# Patient Record
Sex: Female | Born: 1960 | Race: White | Hispanic: No | Marital: Married | Smoking: Never smoker
Health system: Southern US, Community
[De-identification: ages and names within clinical notes are randomized; demographics above are authoritative.]

## PROBLEM LIST (undated history)

## (undated) DIAGNOSIS — I10 Essential (primary) hypertension: Secondary | ICD-10-CM

## (undated) DIAGNOSIS — T8859XA Other complications of anesthesia, initial encounter: Secondary | ICD-10-CM

## (undated) DIAGNOSIS — G43909 Migraine, unspecified, not intractable, without status migrainosus: Secondary | ICD-10-CM

## (undated) DIAGNOSIS — Z87442 Personal history of urinary calculi: Secondary | ICD-10-CM

## (undated) DIAGNOSIS — F419 Anxiety disorder, unspecified: Secondary | ICD-10-CM

## (undated) DIAGNOSIS — A498 Other bacterial infections of unspecified site: Secondary | ICD-10-CM

## (undated) DIAGNOSIS — E785 Hyperlipidemia, unspecified: Secondary | ICD-10-CM

## (undated) DIAGNOSIS — N200 Calculus of kidney: Secondary | ICD-10-CM

## (undated) HISTORY — DX: Essential (primary) hypertension: I10

## (undated) HISTORY — DX: Migraine, unspecified, not intractable, without status migrainosus: G43.909

## (undated) HISTORY — DX: Calculus of kidney: N20.0

## (undated) HISTORY — DX: Anxiety disorder, unspecified: F41.9

## (undated) HISTORY — DX: Hyperlipidemia, unspecified: E78.5

---

## 1998-10-19 ENCOUNTER — Other Ambulatory Visit: Admission: RE | Admit: 1998-10-19 | Discharge: 1998-10-19 | Payer: Self-pay | Admitting: Obstetrics & Gynecology

## 2000-05-21 ENCOUNTER — Other Ambulatory Visit: Admission: RE | Admit: 2000-05-21 | Discharge: 2000-05-21 | Payer: Self-pay | Admitting: Obstetrics & Gynecology

## 2001-05-30 ENCOUNTER — Other Ambulatory Visit: Admission: RE | Admit: 2001-05-30 | Discharge: 2001-05-30 | Payer: Self-pay | Admitting: Obstetrics & Gynecology

## 2002-06-04 ENCOUNTER — Other Ambulatory Visit: Admission: RE | Admit: 2002-06-04 | Discharge: 2002-06-04 | Payer: Self-pay | Admitting: Obstetrics & Gynecology

## 2003-06-10 ENCOUNTER — Other Ambulatory Visit: Admission: RE | Admit: 2003-06-10 | Discharge: 2003-06-10 | Payer: Self-pay | Admitting: Obstetrics & Gynecology

## 2004-11-23 ENCOUNTER — Other Ambulatory Visit: Admission: RE | Admit: 2004-11-23 | Discharge: 2004-11-23 | Payer: Self-pay | Admitting: Specialist

## 2015-03-26 ENCOUNTER — Encounter: Payer: Self-pay | Admitting: Internal Medicine

## 2015-03-26 ENCOUNTER — Ambulatory Visit (INDEPENDENT_AMBULATORY_CARE_PROVIDER_SITE_OTHER): Payer: BLUE CROSS/BLUE SHIELD | Admitting: Internal Medicine

## 2015-03-26 ENCOUNTER — Other Ambulatory Visit (INDEPENDENT_AMBULATORY_CARE_PROVIDER_SITE_OTHER): Payer: BLUE CROSS/BLUE SHIELD

## 2015-03-26 VITALS — BP 132/88 | HR 106 | Temp 98.1°F | Resp 16 | Ht 60.5 in | Wt 153.0 lb

## 2015-03-26 DIAGNOSIS — E785 Hyperlipidemia, unspecified: Secondary | ICD-10-CM

## 2015-03-26 DIAGNOSIS — G44229 Chronic tension-type headache, not intractable: Secondary | ICD-10-CM

## 2015-03-26 DIAGNOSIS — G43009 Migraine without aura, not intractable, without status migrainosus: Secondary | ICD-10-CM

## 2015-03-26 DIAGNOSIS — G43909 Migraine, unspecified, not intractable, without status migrainosus: Secondary | ICD-10-CM | POA: Insufficient documentation

## 2015-03-26 DIAGNOSIS — K219 Gastro-esophageal reflux disease without esophagitis: Secondary | ICD-10-CM

## 2015-03-26 DIAGNOSIS — R519 Headache, unspecified: Secondary | ICD-10-CM

## 2015-03-26 DIAGNOSIS — I10 Essential (primary) hypertension: Secondary | ICD-10-CM | POA: Diagnosis not present

## 2015-03-26 DIAGNOSIS — R51 Headache: Secondary | ICD-10-CM

## 2015-03-26 DIAGNOSIS — G8929 Other chronic pain: Secondary | ICD-10-CM

## 2015-03-26 DIAGNOSIS — E782 Mixed hyperlipidemia: Secondary | ICD-10-CM | POA: Insufficient documentation

## 2015-03-26 LAB — CBC WITH DIFFERENTIAL/PLATELET
BASOS ABS: 0 10*3/uL (ref 0.0–0.1)
Basophils Relative: 0.3 % (ref 0.0–3.0)
EOS ABS: 0.2 10*3/uL (ref 0.0–0.7)
EOS PCT: 2.3 % (ref 0.0–5.0)
HCT: 41.3 % (ref 36.0–46.0)
HEMOGLOBIN: 13.6 g/dL (ref 12.0–15.0)
Lymphocytes Relative: 28.8 % (ref 12.0–46.0)
Lymphs Abs: 2.5 10*3/uL (ref 0.7–4.0)
MCHC: 33 g/dL (ref 30.0–36.0)
MCV: 85.7 fl (ref 78.0–100.0)
MONO ABS: 0.6 10*3/uL (ref 0.1–1.0)
Monocytes Relative: 7 % (ref 3.0–12.0)
Neutro Abs: 5.4 10*3/uL (ref 1.4–7.7)
Neutrophils Relative %: 61.6 % (ref 43.0–77.0)
Platelets: 285 10*3/uL (ref 150.0–400.0)
RBC: 4.82 Mil/uL (ref 3.87–5.11)
RDW: 13.7 % (ref 11.5–15.5)
WBC: 8.8 10*3/uL (ref 4.0–10.5)

## 2015-03-26 LAB — COMPREHENSIVE METABOLIC PANEL
ALBUMIN: 4.3 g/dL (ref 3.5–5.2)
ALK PHOS: 98 U/L (ref 39–117)
ALT: 14 U/L (ref 0–35)
AST: 15 U/L (ref 0–37)
BILIRUBIN TOTAL: 0.4 mg/dL (ref 0.2–1.2)
BUN: 21 mg/dL (ref 6–23)
CO2: 30 mEq/L (ref 19–32)
CREATININE: 0.95 mg/dL (ref 0.40–1.20)
Calcium: 9.8 mg/dL (ref 8.4–10.5)
Chloride: 103 mEq/L (ref 96–112)
GFR: 65.15 mL/min (ref >60.00)
GLUCOSE: 94 mg/dL (ref 70–99)
Potassium: 3.4 mEq/L — ABNORMAL LOW (ref 3.5–5.1)
SODIUM: 142 meq/L (ref 135–145)
TOTAL PROTEIN: 8.1 g/dL (ref 6.0–8.3)

## 2015-03-26 LAB — LIPID PANEL
Cholesterol: 182 mg/dL (ref 0–200)
HDL: 40.4 mg/dL (ref >39.00)
LDL Cholesterol: 114 mg/dL — ABNORMAL HIGH (ref 0–99)
NonHDL: 141.46
Total CHOL/HDL Ratio: 5
Triglycerides: 138 mg/dL (ref 0.0–149.0)
VLDL: 27.6 mg/dL (ref 0.0–40.0)

## 2015-03-26 LAB — HEMOGLOBIN A1C: HEMOGLOBIN A1C: 6.2 % (ref 4.6–6.5)

## 2015-03-26 MED ORDER — LOSARTAN POTASSIUM-HCTZ 100-12.5 MG PO TABS: 1.0000 | ORAL_TABLET | Freq: Every day | ORAL | Status: DC

## 2015-03-26 MED ORDER — RIZATRIPTAN BENZOATE 10 MG PO TABS: 10.0000 mg | ORAL_TABLET | ORAL | Status: DC | PRN

## 2015-03-26 MED ORDER — AMITRIPTYLINE HCL 10 MG PO TABS: 10.0000 mg | ORAL_TABLET | Freq: Every day | ORAL | Status: DC

## 2015-03-26 MED ORDER — OMEPRAZOLE 20 MG PO CPDR: 20.0000 mg | DELAYED_RELEASE_CAPSULE | Freq: Every day | ORAL | Status: DC

## 2015-03-26 MED ORDER — ATORVASTATIN CALCIUM 10 MG PO TABS: 10.0000 mg | ORAL_TABLET | Freq: Every day | ORAL | Status: DC

## 2015-03-26 NOTE — Progress Notes (Signed)
Pre visit review using our clinic review tool, if applicable. No additional management support is needed unless otherwise documented below in the visit note. 

## 2015-03-26 NOTE — Progress Notes (Signed)
Subjective:    Patient ID: Sheryl Lowe, female    DOB: 10/06/1960, 55 y.o.   MRN: [id number]  HPI She is here to establish with a new pcp.  She has no specific complaints.   Migraines, headaches:  Her migraines started in her 20's, they got better, and then worse. She has seen neuro and [facility].  She has been on multiple meds.  Topamax helped, but gave her some memory loss.  She also tried accupuncture, chiropractor, beta blocker, verapamil, and gabapentin.  She does not find the imitex very helpful and always has to repeat the dose.  She has been on the injections in the past as well.  She has tried Scientist, clinical (histocompatibility and immunogenetics) and would like to try that - it worked well.  Her severe migraines are about 2-3 times a month.  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  She has a daily headache that is across the frontal aspect of her head and temples and in the back of her head.  She takes excedrin daily.  She has a stressful life and works on the computer all day.  She does not exercise.    Hypertension: She is taking her medication daily. She is compliant with a low sodium diet.  She denies chest pain, palpitations, edema, shortness of breath and regular headaches. She is not exercising regularly.  She does not monitor her blood pressure at home.    Hyperlipidemia: She is taking her medication daily. She is somewhat compliant with a low fat/cholesterol diet. She is not exercising regularly. She denies myalgias.   No regular exercise.  She hates to exercise.    Medications and allergies reviewed with patient and updated if appropriate.  Patient Active Problem List   Diagnosis Date Noted  . Hyperlipidemia 03/26/2015  . Essential hypertension, benign 03/26/2015  . Migraine 03/26/2015    No current outpatient prescriptions on file prior to visit.   No current facility-administered medications on file prior to visit.    Past Medical History  Diagnosis Date  . Kidney stones   . Hypertension   . Hyperlipidemia    . Anxiety   . Migraine     History reviewed. No pertinent past surgical history.  Social History   Social History  . Marital Status: Married    Spouse Name: N/A  . Number of Children: N/A  . Years of Education: N/A   Social History Main Topics  . Smoking status: Never Smoker   . Smokeless tobacco: Never Used  . Alcohol Use: Yes  . Drug Use: No  . Sexual Activity: Not Asked   Other Topics Concern  . None   Social History Narrative  . None    Family History  Problem Relation Age of Onset  . Stroke Mother   . Heart disease Mother   . Hypertension Father   . Cancer Father     Review of Systems  Constitutional: Negative for fever and chills.  HENT: Positive for postnasal drip.   Respiratory: Positive for cough. Negative for shortness of breath and wheezing.   Cardiovascular: Negative for chest pain, palpitations and leg swelling.  Gastrointestinal: Negative for nausea, abdominal pain, diarrhea, constipation and blood in stool.       GERD  Genitourinary: Negative for dysuria and hematuria.  Musculoskeletal: Negative for back pain and arthralgias.  Skin: Negative for rash.       Dry skin  Neurological: Positive for headaches. Negative for dizziness and light-headedness.  Psychiatric/Behavioral: Negative for  dysphoric mood. The patient is nervous/anxious.        Objective:   Filed Vitals:   03/26/15 1439  BP: 132/88  Pulse: 106  Temp: 98.1 F (36.7 C)  Resp: 16   Filed Weights   03/26/15 1439  Weight: 153 lb (69.4 kg)   Body mass index is 29.38 kg/(m^2).   Physical Exam Constitutional: She appears well-developed and well-nourished. No distress.  HENT:  Head: Normocephalic and atraumatic.  Right Ear: External ear normal. Normal ear canal and TM Left Ear: External ear normal.  Normal ear canal and TM Mouth/Throat: Oropharynx is clear and moist.  Normal bilateral ear canals and tympanic membranes  Eyes: Conjunctivae and EOM are normal.  Neck: Neck  supple. No tracheal deviation present. No thyromegaly present.  No carotid bruit  Cardiovascular: Normal rate, regular rhythm and normal heart sounds.   No murmur heard.  No edema. Pulmonary/Chest: Effort normal and breath sounds normal. No respiratory distress. She has no wheezes. She has no rales.  Abdominal: Soft. She exhibits no distension. There is no tenderness.  Lymphadenopathy: She has no cervical adenopathy.  Skin: Skin is warm and dry. She is not diaphoretic.  Psychiatric: She has a normal mood and affect. Her behavior is normal.       Assessment & Plan:   See Problem List for Assessment and Plan of chronic medical problems.   Follow up 6 months

## 2015-03-26 NOTE — Patient Instructions (Addendum)
  We have reviewed your prior records including labs and tests today.  Test(s) ordered today. Your results will be released to MyChart (or called to you) after review, usually within 72hours after test completion. If any changes need to be made, you will be notified at that same time.   Medications reviewed and updated.  We will change the imitrex to maxalt.  We will also try a medication to help prevent the headaches.  Your prescription(s) have been submitted to your pharmacy. Please take as directed and contact our office if you believe you are having problem(s) with the medication(s).  Try a magnesium supplement.  Start exercising.  Increase your water intake.  Reduce your stress.  Consider massage therapy.  Please schedule followup in 6 months

## 2015-03-27 DIAGNOSIS — K219 Gastro-esophageal reflux disease without esophagitis: Secondary | ICD-10-CM | POA: Insufficient documentation

## 2015-03-27 DIAGNOSIS — G8929 Other chronic pain: Secondary | ICD-10-CM | POA: Insufficient documentation

## 2015-03-27 DIAGNOSIS — R51 Headache: Secondary | ICD-10-CM

## 2015-03-27 NOTE — Assessment & Plan Note (Signed)
BP controlled here today Continue current meds Low sodium diet Start exercising Weight loss

## 2015-03-27 NOTE — Assessment & Plan Note (Signed)
Check lipid panel. Continue lipitor. 

## 2015-03-27 NOTE — Assessment & Plan Note (Signed)
Daily headaches, 2-3 migraines that are severe a month maxalt prn

## 2015-03-27 NOTE — Assessment & Plan Note (Signed)
Daily headaches Advised regular exercise, decreasing stress Need to cut down on excedrin  - may be having some rebound headaches Trial of magnesium supplementation Trial of elavil - start with 5 mg and increase if tolerated

## 2015-03-27 NOTE — Assessment & Plan Note (Signed)
Controlled with daily otc omeprazole

## 2015-03-29 LAB — TSH: TSH: 0.65 u[IU]/mL (ref 0.35–4.50)

## 2015-03-31 ENCOUNTER — Encounter: Payer: Self-pay | Admitting: Internal Medicine

## 2015-03-31 DIAGNOSIS — R7303 Prediabetes: Secondary | ICD-10-CM | POA: Insufficient documentation

## 2015-05-10 ENCOUNTER — Encounter: Payer: Self-pay | Admitting: Internal Medicine

## 2015-05-16 MED ORDER — TOPIRAMATE 25 MG PO TABS: ORAL_TABLET | ORAL | Status: DC

## 2015-05-16 NOTE — Addendum Note (Signed)
Addended by: Pincus SanesBURNS, STACY J on: 05/16/2015 09:57 AM   Modules accepted: Orders, Medications

## 2015-06-15 ENCOUNTER — Ambulatory Visit: Payer: BLUE CROSS/BLUE SHIELD | Admitting: Internal Medicine

## 2015-10-18 ENCOUNTER — Ambulatory Visit (INDEPENDENT_AMBULATORY_CARE_PROVIDER_SITE_OTHER): Payer: BLUE CROSS/BLUE SHIELD | Admitting: Internal Medicine

## 2015-10-18 ENCOUNTER — Encounter: Payer: Self-pay | Admitting: Internal Medicine

## 2015-10-18 VITALS — BP 136/88 | HR 94 | Temp 98.0°F | Resp 16 | Ht 61.0 in | Wt 150.0 lb

## 2015-10-18 DIAGNOSIS — I10 Essential (primary) hypertension: Secondary | ICD-10-CM

## 2015-10-18 DIAGNOSIS — G43009 Migraine without aura, not intractable, without status migrainosus: Secondary | ICD-10-CM

## 2015-10-18 DIAGNOSIS — G44229 Chronic tension-type headache, not intractable: Secondary | ICD-10-CM | POA: Diagnosis not present

## 2015-10-18 MED ORDER — GABAPENTIN 100 MG PO CAPS: ORAL_CAPSULE | ORAL | 3 refills | Status: DC

## 2015-10-18 MED ORDER — CANDESARTAN CILEXETIL-HCTZ 32-12.5 MG PO TABS: 1.0000 | ORAL_TABLET | Freq: Every day | ORAL | 5 refills | Status: DC

## 2015-10-18 NOTE — Assessment & Plan Note (Addendum)
Has combination of migraines and daily headaches Trial of gabapentin. She does have some element of cervical radiculopathy and chronic neck pain, which may be contributing to her headaches and the gabapentin may help Discussed possible side effects of gabapentin Stressed regular exercise Consider physical therapy

## 2015-10-18 NOTE — Progress Notes (Signed)
Subjective:    Patient ID: Sheryl Lowe, female    DOB: 1960/06/11, 55 y.o.   MRN: [id number]  HPI  She is here for an acute visit because she is having side effects from her medication.  Chronic headaches/migraines:  She has been taking the Topamax nightly as prescribed. She did not tolerate it in the past, but agreed to retry because of the severity of her headaches.  Since starting the medication she has been experiencing memory loss and tingling in the hands and feet. These are the same side effect she had previously with the medication. She does want to discontinue the medication. The headaches have improved. She is no longer having daily headaches. She now averages 2 headaches a month. Maxalt works when she takes it. She has tried blood pressure medications in the past and they have not been effective. She also tried amitriptyline and even 10 mg made her too drowsy. She has never tried gabapentin as far she knows.  Hypertension: She is taking her medication daily as prescribed. She does not monitor her blood pressure regularly at home. She is not exercising regularly.  Medications and allergies reviewed with patient and updated if appropriate.  Patient Active Problem List   Diagnosis Date Noted  . Prediabetes 03/31/2015  . Chronic headaches 03/27/2015  . GERD (gastroesophageal reflux disease) 03/27/2015  . Hyperlipidemia 03/26/2015  . Essential hypertension, benign 03/26/2015  . Migraine 03/26/2015    Current Outpatient Prescriptions on File Prior to Visit  Medication Sig Dispense Refill  . atorvastatin (LIPITOR) 10 MG tablet Take 1 tablet (10 mg total) by mouth daily. 90 tablet 3  . losartan-hydrochlorothiazide (HYZAAR) 100-12.5 MG tablet Take 1 tablet by mouth daily. 90 tablet 3  . omeprazole (PRILOSEC) 20 MG capsule Take 1 capsule (20 mg total) by mouth daily. 30 capsule 3  . rizatriptan (MAXALT) 10 MG tablet Take 1 tablet (10 mg total) by mouth as needed for migraine. May  repeat in 2 hours if needed 10 tablet 8  . topiramate (TOPAMAX) 25 MG tablet Take 25 mg at bedtime for one week then increase to 50 mg at bedtime 60 tablet 2   No current facility-administered medications on file prior to visit.     Past Medical History:  Diagnosis Date  . Anxiety   . Hyperlipidemia   . Hypertension   . Kidney stones   . Migraine     No past surgical history on file.  Social History   Social History  . Marital status: Married    Spouse name: N/A  . Number of children: N/A  . Years of education: N/A   Social History Main Topics  . Smoking status: Never Smoker  . Smokeless tobacco: Never Used  . Alcohol use Yes  . Drug use: No  . Sexual activity: Not on file   Other Topics Concern  . Not on file   Social History Narrative  . No narrative on file    Family History  Problem Relation Age of Onset  . Stroke Mother   . Heart disease Mother   . Hypertension Father   . Cancer Father     Review of Systems  Respiratory: Negative for cough, shortness of breath and wheezing.   Cardiovascular: Negative for chest pain, palpitations and leg swelling.  Neurological: Positive for numbness and headaches. Negative for dizziness and light-headedness.  Psychiatric/Behavioral:       Short-term memory loss-related to Topamax       Objective:  Vitals:   10/18/15 1131  BP: 136/88  Pulse: 94  Resp: 16  Temp: 98 F (36.7 C)   Filed Weights   10/18/15 1131  Weight: 150 lb (68 kg)   Body mass index is 28.34 kg/m.   Physical Exam Constitutional: Appears well-developed and well-nourished. No distress.  HENT:  Head: Normocephalic and atraumatic.  Neck: Neck supple. No tracheal deviation present. No thyromegaly present.  Cardiovascular: Normal rate, regular rhythm and normal heart sounds.   No murmur heard. No carotid bruit  Pulmonary/Chest: Effort normal and breath sounds normal. No respiratory distress. No has no wheezes. No rales.  Musculoskeletal:  No edema.  Lymphadenopathy: No cervical adenopathy.  Skin: Skin is warm and dry. Not diaphoretic.  Psychiatric: Normal mood and affect. Behavior is normal.        Assessment & Plan:   See Problem List for Assessment and Plan of chronic medical problems.

## 2015-10-18 NOTE — Progress Notes (Signed)
Patient received education resource, including the self-management goal and tool. Patient verbalized understanding. 

## 2015-10-18 NOTE — Assessment & Plan Note (Signed)
Blood pressure controlled here today Since candesartan has been shown to help with headaches we will change her losartan to candesartan and continue the low-dose hydrochlorothiazide She will check her blood pressure at home to make sure it is controlled on any medication and call if any concerns, uncontrolled blood pressure or side effects

## 2015-10-18 NOTE — Patient Instructions (Signed)
   Medications reviewed and updated.  Changes include stopping the topamax.  Start the gabapentin at night.  Stop the losartan -hctz and start candasartan-hctz.    Your prescription(s) have been submitted to your pharmacy. Please take as directed and contact our office if you believe you are having problem(s) with the medication(s).

## 2015-10-18 NOTE — Assessment & Plan Note (Addendum)
Chronic headaches Maxalt is effective and she will continue that as needed Headaches improved with Topamax, but she is having side effects She did not tolerate Topamax, amitriptyline, calcium channel blockers or beta blockers in the past Trial of gabapentin at night-start at 200 mg and increase as needed

## 2016-02-23 ENCOUNTER — Other Ambulatory Visit: Payer: Self-pay | Admitting: Emergency Medicine

## 2016-02-23 MED ORDER — RIZATRIPTAN BENZOATE 10 MG PO TABS: 10.0000 mg | ORAL_TABLET | ORAL | 8 refills | Status: DC | PRN

## 2016-02-27 ENCOUNTER — Telehealth: Payer: BLUE CROSS/BLUE SHIELD | Admitting: Family

## 2016-02-27 DIAGNOSIS — J329 Chronic sinusitis, unspecified: Secondary | ICD-10-CM

## 2016-02-27 DIAGNOSIS — B9789 Other viral agents as the cause of diseases classified elsewhere: Secondary | ICD-10-CM

## 2016-02-27 MED ORDER — FLUTICASONE PROPIONATE 50 MCG/ACT NA SUSP: 2.0000 | Freq: Every day | NASAL | 6 refills | Status: DC

## 2016-02-27 NOTE — Progress Notes (Signed)

## 2016-02-29 ENCOUNTER — Telehealth: Payer: BLUE CROSS/BLUE SHIELD | Admitting: Family

## 2016-02-29 ENCOUNTER — Telehealth: Payer: Self-pay | Admitting: Internal Medicine

## 2016-02-29 ENCOUNTER — Other Ambulatory Visit: Payer: Self-pay

## 2016-02-29 DIAGNOSIS — J019 Acute sinusitis, unspecified: Secondary | ICD-10-CM

## 2016-02-29 DIAGNOSIS — B9689 Other specified bacterial agents as the cause of diseases classified elsewhere: Secondary | ICD-10-CM

## 2016-02-29 MED ORDER — AMOXICILLIN-POT CLAVULANATE 875-125 MG PO TABS: 1.0000 | ORAL_TABLET | Freq: Two times a day (BID) | ORAL | 0 refills | Status: DC

## 2016-02-29 NOTE — Telephone Encounter (Signed)
I [facility] talked with [facility] at [facility]---augmentin has been filled and is in holding basket for patient to pick up

## 2016-02-29 NOTE — Telephone Encounter (Signed)
Spoke with pt, she said augmentin sent to the wrong pharmacy. We resend rx to [facility] but pt said she is on her way to [facility] now-----[facility] [place] (they have the rx there ready for pick up).

## 2016-02-29 NOTE — Progress Notes (Signed)

## 2016-03-28 ENCOUNTER — Encounter: Payer: BLUE CROSS/BLUE SHIELD | Admitting: Internal Medicine

## 2016-04-06 ENCOUNTER — Other Ambulatory Visit: Payer: Self-pay | Admitting: Internal Medicine

## 2016-05-03 ENCOUNTER — Encounter: Payer: Self-pay | Admitting: Internal Medicine

## 2016-05-03 ENCOUNTER — Ambulatory Visit (INDEPENDENT_AMBULATORY_CARE_PROVIDER_SITE_OTHER): Payer: BLUE CROSS/BLUE SHIELD | Admitting: Internal Medicine

## 2016-05-03 ENCOUNTER — Other Ambulatory Visit (INDEPENDENT_AMBULATORY_CARE_PROVIDER_SITE_OTHER): Payer: BLUE CROSS/BLUE SHIELD

## 2016-05-03 VITALS — BP 118/82 | HR 88 | Temp 97.9°F | Resp 16 | Ht 61.0 in | Wt 151.0 lb

## 2016-05-03 DIAGNOSIS — E7800 Pure hypercholesterolemia, unspecified: Secondary | ICD-10-CM

## 2016-05-03 DIAGNOSIS — R7303 Prediabetes: Secondary | ICD-10-CM

## 2016-05-03 DIAGNOSIS — G44229 Chronic tension-type headache, not intractable: Secondary | ICD-10-CM | POA: Diagnosis not present

## 2016-05-03 DIAGNOSIS — Z114 Encounter for screening for human immunodeficiency virus [HIV]: Secondary | ICD-10-CM | POA: Diagnosis not present

## 2016-05-03 DIAGNOSIS — E663 Overweight: Secondary | ICD-10-CM | POA: Diagnosis not present

## 2016-05-03 DIAGNOSIS — Z1211 Encounter for screening for malignant neoplasm of colon: Secondary | ICD-10-CM | POA: Diagnosis not present

## 2016-05-03 DIAGNOSIS — R3989 Other symptoms and signs involving the genitourinary system: Secondary | ICD-10-CM | POA: Diagnosis not present

## 2016-05-03 DIAGNOSIS — Z0000 Encounter for general adult medical examination without abnormal findings: Secondary | ICD-10-CM

## 2016-05-03 DIAGNOSIS — Z1159 Encounter for screening for other viral diseases: Secondary | ICD-10-CM

## 2016-05-03 DIAGNOSIS — I10 Essential (primary) hypertension: Secondary | ICD-10-CM | POA: Diagnosis not present

## 2016-05-03 DIAGNOSIS — K219 Gastro-esophageal reflux disease without esophagitis: Secondary | ICD-10-CM | POA: Diagnosis not present

## 2016-05-03 DIAGNOSIS — G43009 Migraine without aura, not intractable, without status migrainosus: Secondary | ICD-10-CM

## 2016-05-03 LAB — LIPID PANEL
CHOLESTEROL: 213 mg/dL — AB (ref 0–200)
HDL: 44 mg/dL (ref >39.00)
LDL CALC: 136 mg/dL — AB (ref 0–99)
NonHDL: 169.14
Total CHOL/HDL Ratio: 5
Triglycerides: 168 mg/dL — ABNORMAL HIGH (ref 0.0–149.0)
VLDL: 33.6 mg/dL (ref 0.0–40.0)

## 2016-05-03 LAB — COMPREHENSIVE METABOLIC PANEL
ALBUMIN: 4.7 g/dL (ref 3.5–5.2)
ALT: 17 U/L (ref 0–35)
AST: 17 U/L (ref 0–37)
Alkaline Phosphatase: 95 U/L (ref 39–117)
BUN: 17 mg/dL (ref 6–23)
CALCIUM: 10 mg/dL (ref 8.4–10.5)
CHLORIDE: 100 meq/L (ref 96–112)
CO2: 29 mEq/L (ref 19–32)
CREATININE: 0.85 mg/dL (ref 0.40–1.20)
GFR: 73.77 mL/min (ref >60.00)
Glucose, Bld: 91 mg/dL (ref 70–99)
POTASSIUM: 3 meq/L — AB (ref 3.5–5.1)
SODIUM: 139 meq/L (ref 135–145)
Total Bilirubin: 0.5 mg/dL (ref 0.2–1.2)
Total Protein: 8.5 g/dL — ABNORMAL HIGH (ref 6.0–8.3)

## 2016-05-03 LAB — CBC WITH DIFFERENTIAL/PLATELET
BASOS PCT: 0.5 % (ref 0.0–3.0)
Basophils Absolute: 0 10*3/uL (ref 0.0–0.1)
EOS ABS: 0.2 10*3/uL (ref 0.0–0.7)
EOS PCT: 1.7 % (ref 0.0–5.0)
HEMATOCRIT: 40.5 % (ref 36.0–46.0)
HEMOGLOBIN: 13.7 g/dL (ref 12.0–15.0)
Lymphocytes Relative: 30 % (ref 12.0–46.0)
Lymphs Abs: 2.6 10*3/uL (ref 0.7–4.0)
MCHC: 33.9 g/dL (ref 30.0–36.0)
MCV: 87.1 fl (ref 78.0–100.0)
MONO ABS: 0.6 10*3/uL (ref 0.1–1.0)
Monocytes Relative: 6.6 % (ref 3.0–12.0)
NEUTROS ABS: 5.4 10*3/uL (ref 1.4–7.7)
Neutrophils Relative %: 61.2 % (ref 43.0–77.0)
PLATELETS: 298 10*3/uL (ref 150.0–400.0)
RBC: 4.65 Mil/uL (ref 3.87–5.11)
RDW: 12.6 % (ref 11.5–15.5)
WBC: 8.8 10*3/uL (ref 4.0–10.5)

## 2016-05-03 LAB — URINALYSIS, ROUTINE W REFLEX MICROSCOPIC
BILIRUBIN URINE: NEGATIVE
HGB URINE DIPSTICK: NEGATIVE
LEUKOCYTES UA: NEGATIVE
Nitrite: NEGATIVE
Specific Gravity, Urine: 1.015 (ref 1.000–1.030)
Total Protein, Urine: NEGATIVE
URINE GLUCOSE: NEGATIVE
UROBILINOGEN UA: 0.2 (ref 0.0–1.0)
pH: 7 (ref 5.0–8.0)

## 2016-05-03 LAB — TSH: TSH: 0.63 u[IU]/mL (ref 0.35–4.50)

## 2016-05-03 LAB — HEMOGLOBIN A1C: HEMOGLOBIN A1C: 6.4 % (ref 4.6–6.5)

## 2016-05-03 MED ORDER — PHENTERMINE HCL 30 MG PO CAPS: 30.0000 mg | ORAL_CAPSULE | ORAL | 0 refills | Status: DC

## 2016-05-03 NOTE — Assessment & Plan Note (Signed)
No severe migraine recently maxalt as needed

## 2016-05-03 NOTE — Progress Notes (Signed)
Subjective:    Patient ID: Sheryl Lowe, female    DOB: 1960/09/03, 56 y.o.   MRN: [id number]  HPI She is here for a physical exam.   Prediabetes:  She is fairly compliant with a low sugar/carbohydrate diet.  She knows she needs to eat better.  She is not exercising regularly.  Migraine headaches, chronic migraines: Her headaches/migraines are better.  She has not had a migraines that are severe recently.  She still has the chronic headaches.    Hypertension,hyperlipidemia: She is taking her medication daily. Her diet is ok and she is working on making improvements. She is not exercising regularly.  She does not monitor her blood pressure at home.    Overweight:  She is interested in something that will help her lose weight - just a temporary prescription.  She knows she needs to eat better and needs to exercise more.    ? UTI, small stone:  She has twinges in her back intermittently.  Her urine color changes from yellow to clear.  She has had UTI's in the past and small kidney stones and she thinks she may have one of those but is not sure which.    Medications and allergies reviewed with patient and updated if appropriate.  Patient Active Problem List   Diagnosis Date Noted  . Abnormal urine color 05/03/2016  . Prediabetes 03/31/2015  . Chronic headaches 03/27/2015  . GERD (gastroesophageal reflux disease) 03/27/2015  . Hyperlipidemia 03/26/2015  . Essential hypertension, benign 03/26/2015  . Migraine 03/26/2015    Current Outpatient Prescriptions on File Prior to Visit  Medication Sig Dispense Refill  . atorvastatin (LIPITOR) 10 MG tablet TAKE ONE TABLET BY MOUTH ONCE DAILY 90 tablet 0  . candesartan-hydrochlorothiazide (ATACAND HCT) 32-12.5 MG tablet Take 1 tablet by mouth daily. 30 tablet 5  . gabapentin (NEURONTIN) 100 MG capsule Take 2 caps at night for two nights, increase to 3 caps at night if well tolerated 90 capsule 3  . omeprazole (PRILOSEC) 20 MG capsule Take 1  capsule (20 mg total) by mouth daily. 30 capsule 3  . rizatriptan (MAXALT) 10 MG tablet Take 1 tablet (10 mg total) by mouth as needed for migraine. May repeat in 2 hours if needed 10 tablet 8   No current facility-administered medications on file prior to visit.     Past Medical History:  Diagnosis Date  . Anxiety   . Hyperlipidemia   . Hypertension   . Kidney stones   . Migraine     No past surgical history on file.  Social History   Social History  . Marital status: Married    Spouse name: N/A  . Number of children: N/A  . Years of education: N/A   Social History Main Topics  . Smoking status: Never Smoker  . Smokeless tobacco: Never Used  . Alcohol use Yes  . Drug use: No  . Sexual activity: Not Asked   Other Topics Concern  . None   Social History Narrative  . None    Family History  Problem Relation Age of Onset  . Stroke Mother   . Heart disease Mother   . Hypertension Father   . Cancer Father     Review of Systems  Constitutional: Negative for chills and fever.  Eyes: Negative for visual disturbance.  Respiratory: Negative for cough, shortness of breath and wheezing.   Cardiovascular: Negative for chest pain, palpitations and leg swelling.  Gastrointestinal: Negative for abdominal pain, blood  in stool, constipation, diarrhea and nausea.  Genitourinary: Negative for dysuria and hematuria.       Twinges in back  Musculoskeletal: Positive for neck pain (occ - bulging disc). Negative for back pain.  Skin: Negative for color change and rash.  Neurological: Positive for headaches. Negative for dizziness and light-headedness.  Psychiatric/Behavioral: Negative for dysphoric mood. The patient is not nervous/anxious.        Objective:   Vitals:   05/03/16 1340  BP: 118/82  Pulse: 88  Resp: 16  Temp: 97.9 F (36.6 C)   Filed Weights   05/03/16 1340  Weight: 151 lb (68.5 kg)   Body mass index is 28.53 kg/m.  Wt Readings from Last 3  Encounters:  05/03/16 151 lb (68.5 kg)  10/18/15 150 lb (68 kg)  03/26/15 153 lb (69.4 kg)     Physical Exam Constitutional: She appears well-developed and well-nourished. No distress.  HENT:  Head: Normocephalic and atraumatic.  Right Ear: External ear normal. Normal ear canal and TM Left Ear: External ear normal.  Normal ear canal and TM Mouth/Throat: Oropharynx is clear and moist.  Eyes: Conjunctivae and EOM are normal.  Neck: Neck supple. No tracheal deviation present. No thyromegaly present.  No carotid bruit  Cardiovascular: Normal rate, regular rhythm and normal heart sounds.   No murmur heard.  No edema. Pulmonary/Chest: Effort normal and breath sounds normal. No respiratory distress. She has no wheezes. She has no rales.  Breast: deferred to Gyn Abdominal: Soft. She exhibits no distension. There is no tenderness.  Lymphadenopathy: She has no cervical adenopathy.  Skin: Skin is warm and dry. She is not diaphoretic.  Psychiatric: She has a normal mood and affect. Her behavior is normal.       Assessment & Plan:   Physical exam: Screening blood work ordered Immunizations  Deferred flu vaccine, td up to date Colonoscopy  Never had one - - will refer today Mammogram  - not up to date --  Will schedule Gyn - not up to date -- will schedule Eye exams   -  Up to date  Exercise -  Will start regular exercise - treadmill at home Weight  - discussed weight loss at length - she is motivated to lose weight Skin  - no concerns Substance abuse   none  See Problem List for Assessment and Plan of chronic medical problems.  FU annually, sooner if needed

## 2016-05-03 NOTE — Progress Notes (Signed)
Pre visit review using our clinic review tool, if applicable. No additional management support is needed unless otherwise documented below in the visit note. 

## 2016-05-03 NOTE — Assessment & Plan Note (Signed)
Improved.  Continue current medications  °

## 2016-05-03 NOTE — Assessment & Plan Note (Signed)
Discussed healthy diet, decreased portions and regular exercise at length She really wants to try a medication for a short time to help - ideally just one month Discussed options Wants to try phentermine - ok to use up to 3 months only Discussed risk of inc BP and headaches - she will monitor her BP at home Start 30 mg daily - can increase next month if no side effects

## 2016-05-03 NOTE — Assessment & Plan Note (Signed)
GERD controlled Continue daily medication  

## 2016-05-03 NOTE — Assessment & Plan Note (Signed)
Check a1c Low sugar / carb diet Stressed regular exercise, keeping weight down/weight loss 

## 2016-05-03 NOTE — Patient Instructions (Addendum)
Make gyn, mammogram appointment.    Test(s) ordered today. Your results will be released to [place] (or called to you) after review, usually within 72hours after test completion. If any changes need to be made, you will be notified at that same time.  All other Health Maintenance issues reviewed.   All recommended immunizations and age-appropriate screenings are up-to-date or discussed.  No immunizations administered today.   Medications reviewed and updated.  Changes include trial of phentermine.   Your prescription(s) have been submitted to your pharmacy. Please take as directed and contact our office if you believe you are having problem(s) with the medication(s).  A referral to GI was ordred.   Please followup in one year   Weight loss medications: Phentermine - may cause headaches or elevated blood pressure Qsymia - has phentermine/ topamax Belviq Contrave - naltrexone/wellbutrin saxenda - injection daily -- may cause nausea   Health Maintenance, Female Adopting a healthy lifestyle and getting preventive care can go a long way to promote health and wellness. Talk with your health care provider about what schedule of regular examinations is right for you. This is a good chance for you to check in with your provider about disease prevention and staying healthy. In between checkups, there are plenty of things you can do on your own. Experts have done a lot of research about which lifestyle changes and preventive measures are most likely to keep you healthy. Ask your health care provider for more information. Weight and diet Eat a healthy diet  Be sure to include plenty of vegetables, fruits, low-fat dairy products, and lean protein.  Do not eat a lot of foods high in solid fats, added sugars, or salt.  Get regular exercise. This is one of the most important things you can do for your health.  Most adults should exercise for at least 150 minutes each week. The exercise should  increase your heart rate and make you sweat (moderate-intensity exercise).  Most adults should also do strengthening exercises at least twice a week. This is in addition to the moderate-intensity exercise. Maintain a healthy weight  Body mass index (BMI) is a measurement that can be used to identify possible weight problems. It estimates body fat based on height and weight. Your health care provider can help determine your BMI and help you achieve or maintain a healthy weight.  For females 56 years of age and older:  A BMI below 18.5 is considered underweight.  A BMI of 18.5 to 24.9 is normal.  A BMI of 25 to 29.9 is considered overweight.  A BMI of 30 and above is considered obese. Watch levels of cholesterol and blood lipids  You should start having your blood tested for lipids and cholesterol at 55 years of age, then have this test every 5 years.  You may need to have your cholesterol levels checked more often if:  Your lipid or cholesterol levels are high.  You are older than 55 years of age.  You are at high risk for heart disease. Cancer screening Lung Cancer  Lung cancer screening is recommended for adults 37-68 years old who are at high risk for lung cancer because of a history of smoking.  A yearly low-dose CT scan of the lungs is recommended for people who:  Currently smoke.  Have quit within the past 15 years.  Have at least a 30-pack-year history of smoking. A pack year is smoking an average of one pack of cigarettes a day for 1  year.  Yearly screening should continue until it has been 15 years since you quit.  Yearly screening should stop if you develop a health problem that would prevent you from having lung cancer treatment. Breast Cancer  Practice breast self-awareness. This means understanding how your breasts normally appear and feel.  It also means doing regular breast self-exams. Let your health care provider know about any changes, no matter how  small.  If you are in your 20s or 30s, you should have a clinical breast exam (CBE) by a health care provider every 1-3 years as part of a regular health exam.  If you are 30 or older, have a CBE every year. Also consider having a breast X-ray (mammogram) every year.  If you have a family history of breast cancer, talk to your health care provider about genetic screening.  If you are at high risk for breast cancer, talk to your health care provider about having an MRI and a mammogram every year.  Breast cancer gene (BRCA) assessment is recommended for women who have family members with BRCA-related cancers. BRCA-related cancers include:  Breast.  Ovarian.  Tubal.  Peritoneal cancers.  Results of the assessment will determine the need for genetic counseling and BRCA1 and BRCA2 testing. Cervical Cancer  Your health care provider may recommend that you be screened regularly for cancer of the pelvic organs (ovaries, uterus, and vagina). This screening involves a pelvic examination, including checking for microscopic changes to the surface of your cervix (Pap test). You may be encouraged to have this screening done every 3 years, beginning at age 40.  For women ages 11-65, health care providers may recommend pelvic exams and Pap testing every 3 years, or they may recommend the Pap and pelvic exam, combined with testing for human papilloma virus (HPV), every 5 years. Some types of HPV increase your risk of cervical cancer. Testing for HPV may also be done on women of any age with unclear Pap test results.  Other health care providers may not recommend any screening for nonpregnant women who are considered low risk for pelvic cancer and who do not have symptoms. Ask your health care provider if a screening pelvic exam is right for you.  If you have had past treatment for cervical cancer or a condition that could lead to cancer, you need Pap tests and screening for cancer for at least 20 years  after your treatment. If Pap tests have been discontinued, your risk factors (such as having a new sexual partner) need to be reassessed to determine if screening should resume. Some women have medical problems that increase the chance of getting cervical cancer. In these cases, your health care provider may recommend more frequent screening and Pap tests. Colorectal Cancer  This type of cancer can be detected and often prevented.  Routine colorectal cancer screening usually begins at 55 years of age and continues through 55 years of age.  Your health care provider may recommend screening at an earlier age if you have risk factors for colon cancer.  Your health care provider may also recommend using home test kits to check for hidden blood in the stool.  A small camera at the end of a tube can be used to examine your colon directly (sigmoidoscopy or colonoscopy). This is done to check for the earliest forms of colorectal cancer.  Routine screening usually begins at age 30.  Direct examination of the colon should be repeated every 5-10 years through 55 years of age.  However, you may need to be screened more often if early forms of precancerous polyps or small growths are found. Skin Cancer  Check your skin from head to toe regularly.  Tell your health care provider about any new moles or changes in moles, especially if there is a change in a mole's shape or color.  Also tell your health care provider if you have a mole that is larger than the size of a pencil eraser.  Always use sunscreen. Apply sunscreen liberally and repeatedly throughout the day.  Protect yourself by wearing long sleeves, pants, a wide-brimmed hat, and sunglasses whenever you are outside. Heart disease, diabetes, and high blood pressure  High blood pressure causes heart disease and increases the risk of stroke. High blood pressure is more likely to develop in:  People who have blood pressure in the high end of the  normal range (130-139/85-89 mm Hg).  People who are overweight or obese.  People who are African American.  If you are 48-62 years of age, have your blood pressure checked every 3-5 years. If you are 4 years of age or older, have your blood pressure checked every year. You should have your blood pressure measured twice-once when you are at a hospital or clinic, and once when you are not at a hospital or clinic. Record the average of the two measurements. To check your blood pressure when you are not at a hospital or clinic, you can use:  An automated blood pressure machine at a pharmacy.  A home blood pressure monitor.  If you are between 1 years and 47 years old, ask your health care provider if you should take aspirin to prevent strokes.  Have regular diabetes screenings. This involves taking a blood sample to check your fasting blood sugar level.  If you are at a normal weight and have a low risk for diabetes, have this test once every three years after 55 years of age.  If you are overweight and have a high risk for diabetes, consider being tested at a younger age or more often. Preventing infection Hepatitis B  If you have a higher risk for hepatitis B, you should be screened for this virus. You are considered at high risk for hepatitis B if:  You were born in a country where hepatitis B is common. Ask your health care provider which countries are considered high risk.  Your parents were born in a high-risk country, and you have not been immunized against hepatitis B (hepatitis B vaccine).  You have HIV or AIDS.  You use needles to inject street drugs.  You live with someone who has hepatitis B.  You have had sex with someone who has hepatitis B.  You get hemodialysis treatment.  You take certain medicines for conditions, including cancer, organ transplantation, and autoimmune conditions. Hepatitis C  Blood testing is recommended for:  Everyone born from [birth year] through  1964.  Anyone with known risk factors for hepatitis C. Sexually transmitted infections (STIs)  You should be screened for sexually transmitted infections (STIs) including gonorrhea and chlamydia if:  You are sexually active and are younger than 55 years of age.  You are older than 55 years of age and your health care provider tells you that you are at risk for this type of infection.  Your sexual activity has changed since you were last screened and you are at an increased risk for chlamydia or gonorrhea. Ask your health care provider if you are at risk.  If you do not have HIV, but are at risk, it may be recommended that you take a prescription medicine daily to prevent HIV infection. This is called pre-exposure prophylaxis (PrEP). You are considered at risk if:  You are sexually active and do not regularly use condoms or know the HIV status of your partner(s).  You take drugs by injection.  You are sexually active with a partner who has HIV. Talk with your health care provider about whether you are at high risk of being infected with HIV. If you choose to begin PrEP, you should first be tested for HIV. You should then be tested every 3 months for as long as you are taking PrEP. Pregnancy  If you are premenopausal and you may become pregnant, ask your health care provider about preconception counseling.  If you may become pregnant, take 400 to 800 micrograms (mcg) of folic acid every day.  If you want to prevent pregnancy, talk to your health care provider about birth control (contraception). Osteoporosis and menopause  Osteoporosis is a disease in which the bones lose minerals and strength with aging. This can result in serious bone fractures. Your risk for osteoporosis can be identified using a bone density scan.  If you are 77 years of age or older, or if you are at risk for osteoporosis and fractures, ask your health care provider if you should be screened.  Ask your health  care provider whether you should take a calcium or vitamin D supplement to lower your risk for osteoporosis.  Menopause may have certain physical symptoms and risks.  Hormone replacement therapy may reduce some of these symptoms and risks. Talk to your health care provider about whether hormone replacement therapy is right for you. Follow these instructions at home:  Schedule regular health, dental, and eye exams.  Stay current with your immunizations.  Do not use any tobacco products including cigarettes, chewing tobacco, or electronic cigarettes.  If you are pregnant, do not drink alcohol.  If you are breastfeeding, limit how much and how often you drink alcohol.  Limit alcohol intake to no more than 1 drink per day for nonpregnant women. One drink equals 12 ounces of beer, 5 ounces of wine, or 1 ounces of hard liquor.  Do not use street drugs.  Do not share needles.  Ask your health care provider for help if you need support or information about quitting drugs.  Tell your health care provider if you often feel depressed.  Tell your health care provider if you have ever been abused or do not feel safe at home. This information is not intended to replace advice given to you by your health care provider. Make sure you discuss any questions you have with your health care provider. Document Released: 09/05/2010 Document Revised: 07/29/2015 Document Reviewed: 11/24/2014 Elsevier Interactive Patient Education  2017 [employer].

## 2016-05-03 NOTE — Assessment & Plan Note (Signed)
?   UTI or renal stone Check UA, UCx Consider [place]

## 2016-05-03 NOTE — Assessment & Plan Note (Signed)
BP well controlled Current regimen effective and well tolerated Continue current medications at current doses cmp  

## 2016-05-03 NOTE — Assessment & Plan Note (Signed)
Check lipid panel  Continue daily statin Regular exercise and healthy diet encouraged  

## 2016-05-04 LAB — HEPATITIS C ANTIBODY: HCV AB: NEGATIVE

## 2016-05-04 LAB — HIV ANTIBODY (ROUTINE TESTING W REFLEX): HIV 1&2 Ab, 4th Generation: NONREACTIVE

## 2016-05-07 ENCOUNTER — Encounter: Payer: Self-pay | Admitting: Internal Medicine

## 2016-05-12 ENCOUNTER — Other Ambulatory Visit: Payer: Self-pay | Admitting: Internal Medicine

## 2016-06-19 ENCOUNTER — Encounter: Payer: Self-pay | Admitting: Internal Medicine

## 2016-07-05 ENCOUNTER — Encounter: Payer: Self-pay | Admitting: Internal Medicine

## 2016-08-07 ENCOUNTER — Other Ambulatory Visit: Payer: Self-pay | Admitting: Internal Medicine

## 2016-12-04 ENCOUNTER — Encounter: Payer: Self-pay | Admitting: Internal Medicine

## 2016-12-05 NOTE — Telephone Encounter (Signed)
Pt last seen 04/2016. Would you like an appt scheduled?

## 2017-01-29 ENCOUNTER — Other Ambulatory Visit: Payer: Self-pay | Admitting: Internal Medicine

## 2017-03-04 NOTE — Patient Instructions (Addendum)

## 2017-03-04 NOTE — Progress Notes (Addendum)
Subjective:    Patient ID: Sheryl Lowe, female    DOB: February 19, 1961, 56 y.o.   MRN: [id number]  HPI The patient is here for follow up.  Hypertension: She is taking her medication daily. She is compliant with a low sodium diet.  She denies chest pain, palpitations, edema, shortness of breath and regular headaches. She is not exercising regularly.  She does not monitor her blood pressure at home.    Migraine headaches:  She gets about 3-4 migraines a month.  The maxalt works well and she is happy with that medication.    Overweight:  She is not exercising.  She is not limiting her carbohydrates.    Hyperlipidemia: She is taking her medication daily. She is compliant with a low fat/cholesterol diet. She is not exercising regularly. She denies myalgias.   GERD:  She is taking her medication daily as prescribed.  She denies any GERD symptoms and feels her GERD is well controlled.   Prediabetes:  She is compliant with a low sugar diet.  She needs to do better with her watching the carbohydrates.  She is not exercising regularly.   Medications and allergies reviewed with patient and updated if appropriate.  Patient Active Problem List   Diagnosis Date Noted  . Abnormal urine color 05/03/2016  . Overweight 05/03/2016  . Prediabetes 03/31/2015  . Chronic headaches 03/27/2015  . GERD (gastroesophageal reflux disease) 03/27/2015  . Hyperlipidemia 03/26/2015  . Essential hypertension, benign 03/26/2015  . Migraine 03/26/2015    Current Outpatient Medications on File Prior to Visit  Medication Sig Dispense Refill  . atorvastatin (LIPITOR) 10 MG tablet TAKE ONE TABLET BY MOUTH ONCE DAILY 90 tablet 2  . candesartan-hydrochlorothiazide (ATACAND HCT) 32-12.5 MG tablet TAKE ONE TABLET BY MOUTH ONCE DAILY 90 tablet 3  . gabapentin (NEURONTIN) 100 MG capsule Take 2 caps at night for two nights, increase to 3 caps at night if well tolerated 90 capsule 3  . omeprazole (PRILOSEC) 20 MG capsule  Take 1 capsule (20 mg total) by mouth daily. 30 capsule 3  . rizatriptan (MAXALT) 10 MG tablet TAKE ONE TABLET BY MOUTH AS NEEDED FOR MIGRAINE**MAY REPEAT IN 2 HOURS IF NEEDED** 27 tablet 1   No current facility-administered medications on file prior to visit.     Past Medical History:  Diagnosis Date  . Anxiety   . Hyperlipidemia   . Hypertension   . Kidney stones   . Migraine     No past surgical history on file.  Social History   Socioeconomic History  . Marital status: Married    Spouse name: None  . Number of children: None  . Years of education: None  . Highest education level: None  Social Needs  . Financial resource strain: None  . Food insecurity - worry: None  . Food insecurity - inability: None  . Transportation needs - medical: None  . Transportation needs - non-medical: None  Occupational History  . None  Tobacco Use  . Smoking status: Never Smoker  . Smokeless tobacco: Never Used  Substance and Sexual Activity  . Alcohol use: Yes  . Drug use: No  . Sexual activity: None  Other Topics Concern  . None  Social History Narrative  . None    Family History  Problem Relation Age of Onset  . Stroke Mother   . Heart disease Mother   . Hypertension Father   . Cancer Father     Review of Systems  Constitutional:  Negative for chills and fever.  HENT: Positive for postnasal drip.   Respiratory: Positive for cough. Negative for shortness of breath and wheezing.   Cardiovascular: Negative for chest pain, palpitations and leg swelling.  Gastrointestinal:       No gerd  Neurological: Positive for headaches (migraines). Negative for light-headedness.       Objective:   Vitals:   03/05/17 1042  BP: 124/84  Pulse: 86  Resp: 16  Temp: 98.1 F (36.7 C)  SpO2: 98%   Wt Readings from Last 3 Encounters:  03/05/17 153 lb (69.4 kg)  05/03/16 151 lb (68.5 kg)  10/18/15 150 lb (68 kg)   Body mass index is 28.91 kg/m.   Physical Exam      Constitutional: Appears well-developed and well-nourished. No distress.  HENT:  Head: Normocephalic and atraumatic.  Neck: Neck supple. No tracheal deviation present. No thyromegaly present.  No cervical lymphadenopathy Cardiovascular: Normal rate, regular rhythm and normal heart sounds.   No murmur heard. No carotid bruit .  No edema Pulmonary/Chest: Effort normal and breath sounds normal. No respiratory distress. No has no wheezes. No rales.  Skin: Skin is warm and dry. Not diaphoretic.  Psychiatric: Normal mood and affect. Behavior is normal.      Assessment & Plan:   Will get colonoscopy in the next 6 months  See Problem List for Assessment and Plan of chronic medical problems.

## 2017-03-05 ENCOUNTER — Encounter: Payer: Self-pay | Admitting: Internal Medicine

## 2017-03-05 ENCOUNTER — Other Ambulatory Visit (INDEPENDENT_AMBULATORY_CARE_PROVIDER_SITE_OTHER): Payer: BLUE CROSS/BLUE SHIELD

## 2017-03-05 ENCOUNTER — Ambulatory Visit: Payer: BLUE CROSS/BLUE SHIELD | Admitting: Internal Medicine

## 2017-03-05 VITALS — BP 124/84 | HR 86 | Temp 98.1°F | Resp 16 | Ht 61.0 in | Wt 153.0 lb

## 2017-03-05 DIAGNOSIS — I10 Essential (primary) hypertension: Secondary | ICD-10-CM

## 2017-03-05 DIAGNOSIS — Z23 Encounter for immunization: Secondary | ICD-10-CM | POA: Diagnosis not present

## 2017-03-05 DIAGNOSIS — R7303 Prediabetes: Secondary | ICD-10-CM

## 2017-03-05 DIAGNOSIS — E119 Type 2 diabetes mellitus without complications: Secondary | ICD-10-CM | POA: Insufficient documentation

## 2017-03-05 DIAGNOSIS — E663 Overweight: Secondary | ICD-10-CM | POA: Diagnosis not present

## 2017-03-05 DIAGNOSIS — E7849 Other hyperlipidemia: Secondary | ICD-10-CM

## 2017-03-05 DIAGNOSIS — K219 Gastro-esophageal reflux disease without esophagitis: Secondary | ICD-10-CM

## 2017-03-05 DIAGNOSIS — G43009 Migraine without aura, not intractable, without status migrainosus: Secondary | ICD-10-CM | POA: Diagnosis not present

## 2017-03-05 LAB — COMPREHENSIVE METABOLIC PANEL
ALBUMIN: 4.5 g/dL (ref 3.5–5.2)
ALT: 18 U/L (ref 0–35)
AST: 18 U/L (ref 0–37)
Alkaline Phosphatase: 81 U/L (ref 39–117)
BUN: 18 mg/dL (ref 6–23)
CALCIUM: 9.7 mg/dL (ref 8.4–10.5)
CHLORIDE: 103 meq/L (ref 96–112)
CO2: 29 mEq/L (ref 19–32)
Creatinine, Ser: 0.84 mg/dL (ref 0.40–1.20)
GFR: 74.55 mL/min (ref >60.00)
Glucose, Bld: 101 mg/dL — ABNORMAL HIGH (ref 70–99)
POTASSIUM: 3.9 meq/L (ref 3.5–5.1)
Sodium: 141 mEq/L (ref 135–145)
Total Bilirubin: 0.5 mg/dL (ref 0.2–1.2)
Total Protein: 8 g/dL (ref 6.0–8.3)

## 2017-03-05 LAB — HEMOGLOBIN A1C: Hgb A1c MFr Bld: 6.5 % (ref 4.6–6.5)

## 2017-03-05 LAB — LIPID PANEL
CHOLESTEROL: 184 mg/dL (ref 0–200)
HDL: 37.6 mg/dL — ABNORMAL LOW (ref >39.00)
LDL CALC: 113 mg/dL — AB (ref 0–99)
NonHDL: 146.54
TRIGLYCERIDES: 166 mg/dL — AB (ref 0.0–149.0)
Total CHOL/HDL Ratio: 5
VLDL: 33.2 mg/dL (ref 0.0–40.0)

## 2017-03-05 NOTE — Assessment & Plan Note (Signed)
Will start regular exercise Eating well Will work on weight loss

## 2017-03-05 NOTE — Assessment & Plan Note (Signed)
BP well controlled Current regimen effective and well tolerated Continue current medications at current doses cmp  

## 2017-03-05 NOTE — Assessment & Plan Note (Signed)
GERD controlled Continue daily medication  

## 2017-03-05 NOTE — Assessment & Plan Note (Signed)
Check a1c Low sugar / carb diet Stressed regular exercise, weight loss  

## 2017-03-05 NOTE — Assessment & Plan Note (Signed)
Check lipid panel  Continue daily statin Regular exercise and healthy diet encouraged  

## 2017-03-05 NOTE — Assessment & Plan Note (Signed)
Migraines 3-4 times a month maxalt works well  Continue above

## 2017-05-17 ENCOUNTER — Encounter: Payer: Self-pay | Admitting: Internal Medicine

## 2017-05-21 ENCOUNTER — Encounter: Payer: Self-pay | Admitting: Internal Medicine

## 2017-05-25 MED ORDER — LORCASERIN HCL 10 MG PO TABS: 10.0000 mg | ORAL_TABLET | Freq: Two times a day (BID) | ORAL | 5 refills | Status: DC

## 2017-05-26 NOTE — Addendum Note (Signed)
Addended by: Pincus SanesBURNS, Eon Zunker J on: 05/26/2017 01:06 PM   Modules accepted: Orders

## 2017-05-28 ENCOUNTER — Telehealth: Payer: Self-pay | Admitting: Emergency Medicine

## 2017-05-28 NOTE — Telephone Encounter (Signed)
Attempted a prior auth on Belviq, medication is not conver at all by patients insurance. Please advise on alternative

## 2017-05-28 NOTE — Telephone Encounter (Signed)
She has tried phentermine and it did not work - does that help or do we need to try something else

## 2017-05-28 NOTE — Telephone Encounter (Signed)
Per Cover My Meds, This request cannot be processed due to the medication is not covered by the plan.

## 2017-05-29 MED ORDER — NALTREXONE-BUPROPION HCL ER 8-90 MG PO TB12: ORAL_TABLET | ORAL | 0 refills | Status: DC

## 2017-05-29 NOTE — Telephone Encounter (Signed)
Number prescription sent to pharmacy.  Tried phentermine and it was not effective.  Not able to take Qsymia due to intolerance of Topamax.  Belviq not covered.

## 2017-05-29 NOTE — Telephone Encounter (Signed)
Spoke with pts insurance, weightloss drugs are not covered by patients plan at all. Will notify pt via MyChart. Only medications that weightloss is a secondary use will be covered.

## 2017-05-29 NOTE — Telephone Encounter (Signed)
Notified pt via My Chart.

## 2017-05-30 MED ORDER — LORCASERIN HCL 10 MG PO TABS: 10.0000 mg | ORAL_TABLET | Freq: Two times a day (BID) | ORAL | 5 refills | Status: DC

## 2017-05-30 NOTE — Telephone Encounter (Signed)
Have her try Belviq - with a savings card.  rx printed

## 2017-05-30 NOTE — Addendum Note (Signed)
Addended by: Pincus SanesBURNS, Lennan Malone J on: 05/30/2017 04:14 PM   Modules accepted: Orders

## 2017-05-30 NOTE — Telephone Encounter (Signed)
Informed pt that medication would not be covered of any type by insurance. Advised pt she would have to pay out of pocket if she wanted to proceed. Per pt she would like to try paying out of pocket. Is there any particular medication that you want to send in since a PA will not be needed.

## 2017-06-22 ENCOUNTER — Other Ambulatory Visit: Payer: Self-pay | Admitting: Internal Medicine

## 2017-09-28 ENCOUNTER — Telehealth: Payer: BLUE CROSS/BLUE SHIELD | Admitting: Family Medicine

## 2017-09-28 ENCOUNTER — Telehealth: Payer: Self-pay | Admitting: Family Medicine

## 2017-09-28 DIAGNOSIS — B001 Herpesviral vesicular dermatitis: Secondary | ICD-10-CM

## 2017-09-28 MED ORDER — VALACYCLOVIR HCL 1 G PO TABS: 2000.0000 mg | ORAL_TABLET | Freq: Two times a day (BID) | ORAL | 0 refills | Status: AC

## 2017-09-28 NOTE — Telephone Encounter (Signed)
Called patient to discuss her concern about the treatment plan for cold sore and explain the reason for clinical treatment plan decision making.   Patient was defensive during the call and upset that the e-visit she created to address cold sores also mentions that she has "a sore spot under her chin and some congestion".  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   The medication that treats this condition (Valtrex) was sent to the patient's preferred pharmacy. She did verbally confirm that she does NOT have an allergy to valtrex- even though the questionnaires states this as a response.  She was especially concerned that using Abreva was still recommended even though she was currently using and having minimal relief- she was informed that she can continue this medication in conjunction with the oral treatment now prescribed and this topical medication may provide more effective local symptom relief.  She was upset her mention of congestion was not addressed but it was explained to her that an additional questionnaire specific to her symptoms has to be filled out for each condition to ensure adequate information about each condition is obtained.  She has made a face to face appointment with a provider- pending eval 09/29/2017.

## 2017-09-28 NOTE — Progress Notes (Signed)
We are sorry that you are not feeling well.  Here is how we plan to help!  Based on what you have shared with me it does look like you have a viral infection.    Most cold sores or fever blisters are small fluid filled blisters around the mouth caused by herpes simplex virus.  The most common strain of the virus causing cold sores is herpes simplex virus 1.  It can be spread by skin contact, sharing eating utensils, or even sharing towels.  Cold sores are contagious to other people until dry. (Approximately 5-7 days).  Wash your hands. You can spread the virus to your eyes through handling your contact lenses after touching the lesions.  Most people experience pain at the sight or tingling sensations in their lips that may begin before the ulcers erupt.  Herpes simplex is treatable but not curable.  It may lie dormant for a long time and then reappear due to stress or prolonged sun exposure.  Many patients have success in treating their cold sores with an over the counter topical called Abreva.  You may apply the cream up to 5 times daily (maximum 10 days) until healing occurs.  If you would like to use an oral antiviral medication to speed the healing of your cold sore, I have sent a prescription to your local pharmacy Valacyclovir 2 gm twice daily for 1 day    HOME CARE:   Wash your hands frequently.  Do not pick at or rub the sore.  Don't open the blisters.  Avoid kissing other people during this time.  Avoid sharing drinking glasses, eating utensils, or razors.  Do not handle contact lenses unless you have thoroughly washed your hands with soap and warm water!  Avoid oral sex during this time.  Herpes from sores on your mouth can spread to your partner's genital area.  Avoid contact with anyone who has eczema or a weakened immune system.  Cold sores are often triggered by exposure to intense sunlight, use a lip balm containing a sunscreen (SPF 30 or higher).  GET HELP RIGHT AWAY  IF:   Blisters look infected.  Blisters occur near or in the eye.  Symptoms last longer than 10 days.  Your symptoms become worse.  MAKE SURE YOU:   Understand these instructions.  Will watch your condition.  Will get help right away if you are not doing well or get worse.    Your e-visit answers were reviewed by a board certified advanced clinical practitioner to complete your personal care plan.  Depending upon the condition, your plan could have  Included both over the counter or prescription medications.    Please review your pharmacy choice.  Be sure that the pharmacy you have chosen is open so that you can pick up your prescription now.  If there is a problem you csn message your provider in MyChart to have the prescription routed to another pharmacy.    Your safety is important to us.  If you have drug allergies check our prescription carefully.  For the next 24 hours you can use MyChart to ask questions about today's visit, request a non-urgent call back, or ask for a work or school excuse from your e-visit provider.  You will get an email in the next two days asking about your experience.  I hope that your e-visit has been valuable and will speed your recovery.  

## 2017-09-29 ENCOUNTER — Ambulatory Visit: Payer: BLUE CROSS/BLUE SHIELD | Admitting: Family Medicine

## 2017-10-11 ENCOUNTER — Other Ambulatory Visit: Payer: Self-pay | Admitting: Emergency Medicine

## 2017-10-11 MED ORDER — RIZATRIPTAN BENZOATE 10 MG PO TABS: ORAL_TABLET | ORAL | 0 refills | Status: DC

## 2017-10-21 NOTE — Progress Notes (Signed)
Subjective:    Patient ID: Sheryl RavenAlma Fauble, female    DOB: 1960-08-16, 57 y.o.   MRN: [id number]  HPI    Patient Active Problem List   Diagnosis Date Noted  . Diabetes mellitus without complication (HCC) 03/05/2017  . Overweight 05/03/2016  . Chronic headaches 03/27/2015  . GERD (gastroesophageal reflux disease) 03/27/2015  . Hyperlipidemia 03/26/2015  . Essential hypertension, benign 03/26/2015  . Migraine 03/26/2015    Current Outpatient Medications on File Prior to Visit  Medication Sig Dispense Refill  . atorvastatin (LIPITOR) 10 MG tablet TAKE 1 TABLET BY MOUTH ONCE DAILY 90 tablet 1  . candesartan-hydrochlorothiazide (ATACAND HCT) 32-12.5 MG tablet TAKE 1 TABLET BY MOUTH ONCE DAILY 90 tablet 1  . Lorcaserin HCl 10 MG TABS Take 10 mg by mouth 2 (two) times daily. 60 tablet 5  . Naltrexone-buPROPion HCl ER 8-90 MG TB12 1 tab po qam x 1 week, then 1 tab po bid x 1 week, then 2 tabs po qam and 1 tab po pm x 1 week, then 2 tabs po bid 70 tablet 0  . omeprazole (PRILOSEC) 20 MG capsule Take 1 capsule (20 mg total) by mouth daily. 30 capsule 3  . rizatriptan (MAXALT) 10 MG tablet TAKE ONE TABLET BY MOUTH AS NEEDED FOR MIGRAINE**MAY REPEAT IN 2 HOURS IF NEEDED** 27 tablet 0   No current facility-administered medications on file prior to visit.     Past Medical History:  Diagnosis Date  . Anxiety   . Hyperlipidemia   . Hypertension   . Kidney stones   . Migraine     No past surgical history on file.  Social History   Socioeconomic History  . Marital status: Married    Spouse name: Not on file  . Number of children: Not on file  . Years of education: Not on file  . Highest education level: Not on file  Occupational History  . Not on file  Social Needs  . Financial resource strain: Not on file  . Food insecurity:    Worry: Not on file    Inability: Not on file  . Transportation needs:    Medical: Not on file    Non-medical: Not on file  Tobacco Use  . Smoking  status: Never Smoker  . Smokeless tobacco: Never Used  Substance and Sexual Activity  . Alcohol use: Yes  . Drug use: No  . Sexual activity: Not on file  Lifestyle  . Physical activity:    Days per week: Not on file    Minutes per session: Not on file  . Stress: Not on file  Relationships  . Social connections:    Talks on phone: Not on file    Gets together: Not on file    Attends religious service: Not on file    Active member of club or organization: Not on file    Attends meetings of clubs or organizations: Not on file    Relationship status: Not on file  Other Topics Concern  . Not on file  Social History Narrative  . Not on file    Family History  Problem Relation Age of Onset  . Stroke Mother   . Heart disease Mother   . Hypertension Father   . Cancer Father     Review of Systems     Objective:  There were no vitals filed for this visit. BP Readings from Last 3 Encounters:  03/05/17 124/84  05/03/16 118/82  10/18/15 136/88  Wt Readings from Last 3 Encounters:  03/05/17 153 lb (69.4 kg)  05/03/16 151 lb (68.5 kg)  10/18/15 150 lb (68 kg)   There is no height or weight on file to calculate BMI.   Physical Exam        Assessment & Plan:    See Problem List for Assessment and Plan of chronic medical problems.  note This encounter was created in error - please disregard.

## 2017-10-22 ENCOUNTER — Encounter: Payer: BLUE CROSS/BLUE SHIELD | Admitting: Internal Medicine

## 2018-02-01 ENCOUNTER — Other Ambulatory Visit (INDEPENDENT_AMBULATORY_CARE_PROVIDER_SITE_OTHER): Payer: BLUE CROSS/BLUE SHIELD

## 2018-02-01 ENCOUNTER — Encounter: Payer: Self-pay | Admitting: Internal Medicine

## 2018-02-01 ENCOUNTER — Ambulatory Visit: Payer: BLUE CROSS/BLUE SHIELD | Admitting: Internal Medicine

## 2018-02-01 VITALS — BP 158/92 | HR 79 | Temp 98.2°F | Resp 16 | Ht 61.0 in | Wt 155.0 lb

## 2018-02-01 DIAGNOSIS — E7849 Other hyperlipidemia: Secondary | ICD-10-CM

## 2018-02-01 DIAGNOSIS — Z1211 Encounter for screening for malignant neoplasm of colon: Secondary | ICD-10-CM

## 2018-02-01 DIAGNOSIS — Z0000 Encounter for general adult medical examination without abnormal findings: Secondary | ICD-10-CM | POA: Diagnosis not present

## 2018-02-01 DIAGNOSIS — I10 Essential (primary) hypertension: Secondary | ICD-10-CM

## 2018-02-01 DIAGNOSIS — G43009 Migraine without aura, not intractable, without status migrainosus: Secondary | ICD-10-CM

## 2018-02-01 DIAGNOSIS — E663 Overweight: Secondary | ICD-10-CM

## 2018-02-01 DIAGNOSIS — K219 Gastro-esophageal reflux disease without esophagitis: Secondary | ICD-10-CM

## 2018-02-01 DIAGNOSIS — F411 Generalized anxiety disorder: Secondary | ICD-10-CM | POA: Insufficient documentation

## 2018-02-01 DIAGNOSIS — E119 Type 2 diabetes mellitus without complications: Secondary | ICD-10-CM | POA: Diagnosis not present

## 2018-02-01 DIAGNOSIS — Z23 Encounter for immunization: Secondary | ICD-10-CM | POA: Diagnosis not present

## 2018-02-01 LAB — LIPID PANEL
CHOLESTEROL: 228 mg/dL — AB (ref 0–200)
HDL: 34.9 mg/dL — ABNORMAL LOW (ref >39.00)
LDL Cholesterol: 157 mg/dL — ABNORMAL HIGH (ref 0–99)
NonHDL: 192.62
Total CHOL/HDL Ratio: 7
Triglycerides: 176 mg/dL — ABNORMAL HIGH (ref 0.0–149.0)
VLDL: 35.2 mg/dL (ref 0.0–40.0)

## 2018-02-01 LAB — CBC WITH DIFFERENTIAL/PLATELET
Basophils Absolute: 0 10*3/uL (ref 0.0–0.1)
Basophils Relative: 0.4 % (ref 0.0–3.0)
Eosinophils Absolute: 0.3 10*3/uL (ref 0.0–0.7)
Eosinophils Relative: 2.8 % (ref 0.0–5.0)
HCT: 36.5 % (ref 36.0–46.0)
Hemoglobin: 12.2 g/dL (ref 12.0–15.0)
LYMPHS ABS: 2.2 10*3/uL (ref 0.7–4.0)
Lymphocytes Relative: 22.8 % (ref 12.0–46.0)
MCHC: 33.5 g/dL (ref 30.0–36.0)
MCV: 87.9 fl (ref 78.0–100.0)
MONO ABS: 0.8 10*3/uL (ref 0.1–1.0)
MONOS PCT: 7.8 % (ref 3.0–12.0)
NEUTROS PCT: 66.2 % (ref 43.0–77.0)
Neutro Abs: 6.5 10*3/uL (ref 1.4–7.7)
Platelets: 254 10*3/uL (ref 150.0–400.0)
RBC: 4.15 Mil/uL (ref 3.87–5.11)
RDW: 13.4 % (ref 11.5–15.5)
WBC: 9.8 10*3/uL (ref 4.0–10.5)

## 2018-02-01 LAB — COMPREHENSIVE METABOLIC PANEL
ALT: 12 U/L (ref 0–35)
AST: 14 U/L (ref 0–37)
Albumin: 3.9 g/dL (ref 3.5–5.2)
Alkaline Phosphatase: 83 U/L (ref 39–117)
BUN: 17 mg/dL (ref 6–23)
CO2: 26 mEq/L (ref 19–32)
CREATININE: 0.87 mg/dL (ref 0.40–1.20)
Calcium: 9.3 mg/dL (ref 8.4–10.5)
Chloride: 110 mEq/L (ref 96–112)
GFR: 71.36 mL/min (ref >60.00)
Glucose, Bld: 105 mg/dL — ABNORMAL HIGH (ref 70–99)
Potassium: 3.8 mEq/L (ref 3.5–5.1)
Sodium: 145 mEq/L (ref 135–145)
TOTAL PROTEIN: 7.2 g/dL (ref 6.0–8.3)
Total Bilirubin: 0.4 mg/dL (ref 0.2–1.2)

## 2018-02-01 LAB — HEMOGLOBIN A1C: Hgb A1c MFr Bld: 6.2 % (ref 4.6–6.5)

## 2018-02-01 LAB — TSH: TSH: 0.96 u[IU]/mL (ref 0.35–4.50)

## 2018-02-01 MED ORDER — CANDESARTAN CILEXETIL-HCTZ 32-12.5 MG PO TABS: 1.0000 | ORAL_TABLET | Freq: Every day | ORAL | 1 refills | Status: DC

## 2018-02-01 NOTE — Assessment & Plan Note (Signed)
Diet controlled Has eye appointment scheduled-we will send report Check A1c Stressed regular exercise, which he is not doing Work on weight loss Low carbohydrate/sugar diet-discussed better snack she can take in ways to change her eating habits Follow-up in 6 months

## 2018-02-01 NOTE — Assessment & Plan Note (Signed)
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

## 2018-02-01 NOTE — Assessment & Plan Note (Signed)
Taking omeprazole when she takes the BP medication - since being out of the medication for the past 2 days she has not needed the omeprazole-she will try taking her blood pressure medication at a different time and ideally be able to avoid the letrozole Taking Prilosec as needed

## 2018-02-01 NOTE — Addendum Note (Signed)
Addended by: Zenovia JordanMITCHELL, TAYLOR B on: 02/01/2018 09:40 AM   Modules accepted: Orders

## 2018-02-01 NOTE — Assessment & Plan Note (Signed)
Controlled with maxalt continue

## 2018-02-01 NOTE — Assessment & Plan Note (Signed)
Has been out of medication for two days - BP elevated here today Restart medication - it was controlling her BP well cmp

## 2018-02-01 NOTE — Progress Notes (Signed)
Subjective:    Patient ID: Sheryl RavenAlma Nila, female    DOB: 03-10-1960, 57 y.o.   MRN: [id number]  HPI She is here for a physical exam.   She still has a lot of anxiety.  She is a perfectionist and that causes anxiety.    She has been out of her BP medications for two days.  When she was on it her BP was well controlled in the 120's.    Medications and allergies reviewed with patient and updated if appropriate.  Patient Active Problem List   Diagnosis Date Noted  . Diabetes mellitus without complication (HCC) 03/05/2017  . Overweight 05/03/2016  . Chronic headaches 03/27/2015  . GERD (gastroesophageal reflux disease) 03/27/2015  . Hyperlipidemia 03/26/2015  . Essential hypertension, benign 03/26/2015  . Migraine 03/26/2015    Current Outpatient Medications on File Prior to Visit  Medication Sig Dispense Refill  . atorvastatin (LIPITOR) 10 MG tablet TAKE 1 TABLET BY MOUTH ONCE DAILY 90 tablet 1  . candesartan-hydrochlorothiazide (ATACAND HCT) 32-12.5 MG tablet TAKE 1 TABLET BY MOUTH ONCE DAILY 90 tablet 1  . Lorcaserin HCl 10 MG TABS Take 10 mg by mouth 2 (two) times daily. 60 tablet 5  . omeprazole (PRILOSEC) 20 MG capsule Take 1 capsule (20 mg total) by mouth daily. 30 capsule 3  . rizatriptan (MAXALT) 10 MG tablet TAKE ONE TABLET BY MOUTH AS NEEDED FOR MIGRAINE**MAY REPEAT IN 2 HOURS IF NEEDED** 27 tablet 0   No current facility-administered medications on file prior to visit.     Past Medical History:  Diagnosis Date  . Anxiety   . Hyperlipidemia   . Hypertension   . Kidney stones   . Migraine     No past surgical history on file.  Social History   Socioeconomic History  . Marital status: Married    Spouse name: Not on file  . Number of children: Not on file  . Years of education: Not on file  . Highest education level: Not on file  Occupational History  . Not on file  Social Needs  . Financial resource strain: Not on file  . Food insecurity:    Worry:  Not on file    Inability: Not on file  . Transportation needs:    Medical: Not on file    Non-medical: Not on file  Tobacco Use  . Smoking status: Never Smoker  . Smokeless tobacco: Never Used  Substance and Sexual Activity  . Alcohol use: Yes  . Drug use: No  . Sexual activity: Not on file  Lifestyle  . Physical activity:    Days per week: Not on file    Minutes per session: Not on file  . Stress: Not on file  Relationships  . Social connections:    Talks on phone: Not on file    Gets together: Not on file    Attends religious service: Not on file    Active member of club or organization: Not on file    Attends meetings of clubs or organizations: Not on file    Relationship status: Not on file  Other Topics Concern  . Not on file  Social History Narrative  . Not on file    Family History  Problem Relation Age of Onset  . Stroke Mother   . Heart disease Mother   . Hypertension Father   . Cancer Father     Review of Systems  Constitutional: Negative for chills and fever.  Eyes: Negative  for visual disturbance.  Respiratory: Negative for cough, shortness of breath and wheezing.   Cardiovascular: Negative for chest pain, palpitations and leg swelling.  Gastrointestinal: Negative for abdominal pain, blood in stool, constipation, diarrhea and nausea.       Genella Rife only when she takes the BP med  Genitourinary: Negative for dysuria and hematuria.  Musculoskeletal: Negative for arthralgias and back pain.  Skin: Negative for color change and rash.  Neurological: Positive for headaches. Negative for light-headedness.  Psychiatric/Behavioral: Negative for dysphoric mood. The patient is nervous/anxious (generalized).        Objective:   Vitals:   02/01/18 0849  BP: (!) 158/92  Pulse: 79  Resp: 16  Temp: 98.2 F (36.8 C)  SpO2: 97%   Filed Weights   02/01/18 0849  Weight: 155 lb (70.3 kg)   Body mass index is 29.29 kg/m.  BP Readings from Last 3 Encounters:    02/01/18 (!) 158/92  03/05/17 124/84  05/03/16 118/82    Wt Readings from Last 3 Encounters:  02/01/18 155 lb (70.3 kg)  03/05/17 153 lb (69.4 kg)  05/03/16 151 lb (68.5 kg)     Physical Exam Constitutional: She appears well-developed and well-nourished. No distress.  HENT:  Head: Normocephalic and atraumatic.  Right Ear: External ear normal. Normal ear canal and TM Left Ear: External ear normal.  Normal ear canal and TM Mouth/Throat: Oropharynx is clear and moist.  Eyes: Conjunctivae and EOM are normal.  Neck: Neck supple. No tracheal deviation present. No thyromegaly present.  No carotid bruit  Cardiovascular: Normal rate, regular rhythm and normal heart sounds.   No murmur heard.  No edema. Pulmonary/Chest: Effort normal and breath sounds normal. No respiratory distress. She has no wheezes. She has no rales.  Breast: deferred to Gyn Abdominal: Soft. She exhibits no distension. There is no tenderness.  Lymphadenopathy: She has no cervical adenopathy.  Skin: Skin is warm and dry. She is not diaphoretic.  Psychiatric: She has a normal mood and affect. Her behavior is normal.        Assessment & Plan:   Physical exam: Screening blood work  ordered Immunizations   Flu vaccine today, tdap today, pneumonia vaccine due, shingrix discussed Colonoscopy   Never had one --  Referred today Mammogram   Up to date  Gyn    Up to date  Eye exams  Up to date  Exercise  Not exercising. Weight  Encouraged weight loss Skin   No  Substance abuse   none  See Problem List for Assessment and Plan of chronic medical problems.    FU in 6 months

## 2018-02-01 NOTE — Assessment & Plan Note (Signed)
Stressed weight loss Start regular exercise if possible-even is only 10 minutes a day-increase as possible Discussed eating habits-discussed better snacks and eating throughout the day Follow-up in 6 months

## 2018-02-01 NOTE — Patient Instructions (Addendum)
Tests ordered today. Your results will be released to [facility] (or called to you) after review, usually within 72hours after test completion. If any changes need to be made, you will be notified at that same time.  All other Health Maintenance issues reviewed.   All recommended immunizations and age-appropriate screenings are up-to-date or discussed.  Tetanus and flu immunizations administered today.   Medications reviewed and updated.  Changes include :  Restart your BP medication   A referral was ordered for GI for your colonoscopy.  Please followup in 6 months   Health Maintenance, Female Adopting a healthy lifestyle and getting preventive care can go a long way to promote health and wellness. Talk with your health care provider about what schedule of regular examinations is right for you. This is a good chance for you to check in with your provider about disease prevention and staying healthy. In between checkups, there are plenty of things you can do on your own. Experts have done a lot of research about which lifestyle changes and preventive measures are most likely to keep you healthy. Ask your health care provider for more information. Weight and diet Eat a healthy diet  Be sure to include plenty of vegetables, fruits, low-fat dairy products, and lean protein.  Do not eat a lot of foods high in solid fats, added sugars, or salt.  Get regular exercise. This is one of the most important things you can do for your health. ? Most adults should exercise for at least 150 minutes each week. The exercise should increase your heart rate and make you sweat (moderate-intensity exercise). ? Most adults should also do strengthening exercises at least twice a week. This is in addition to the moderate-intensity exercise.  Maintain a healthy weight  Body mass index (BMI) is a measurement that can be used to identify possible weight problems. It estimates body fat based on height and weight.  Your health care provider can help determine your BMI and help you achieve or maintain a healthy weight.  For females 44 years of age and older: ? A BMI below 18.5 is considered underweight. ? A BMI of 18.5 to 24.9 is normal. ? A BMI of 25 to 29.9 is considered overweight. ? A BMI of 30 and above is considered obese.  Watch levels of cholesterol and blood lipids  You should start having your blood tested for lipids and cholesterol at 56 years of age, then have this test every 5 years.  You may need to have your cholesterol levels checked more often if: ? Your lipid or cholesterol levels are high. ? You are older than 56 years of age. ? You are at high risk for heart disease.  Cancer screening Lung Cancer  Lung cancer screening is recommended for adults 17-52 years old who are at high risk for lung cancer because of a history of smoking.  A yearly low-dose CT scan of the lungs is recommended for people who: ? Currently smoke. ? Have quit within the past 15 years. ? Have at least a 30-pack-year history of smoking. A pack year is smoking an average of one pack of cigarettes a day for 1 year.  Yearly screening should continue until it has been 15 years since you quit.  Yearly screening should stop if you develop a health problem that would prevent you from having lung cancer treatment.  Breast Cancer  Practice breast self-awareness. This means understanding how your breasts normally appear and feel.  It also  means doing regular breast self-exams. Let your health care provider know about any changes, no matter how small.  If you are in your 20s or 30s, you should have a clinical breast exam (CBE) by a health care provider every 1-3 years as part of a regular health exam.  If you are 49 or older, have a CBE every year. Also consider having a breast X-ray (mammogram) every year.  If you have a family history of breast cancer, talk to your health care provider about genetic  screening.  If you are at high risk for breast cancer, talk to your health care provider about having an MRI and a mammogram every year.  Breast cancer gene (BRCA) assessment is recommended for women who have family members with BRCA-related cancers. BRCA-related cancers include: ? Breast. ? Ovarian. ? Tubal. ? Peritoneal cancers.  Results of the assessment will determine the need for genetic counseling and BRCA1 and BRCA2 testing.  Cervical Cancer Your health care provider may recommend that you be screened regularly for cancer of the pelvic organs (ovaries, uterus, and vagina). This screening involves a pelvic examination, including checking for microscopic changes to the surface of your cervix (Pap test). You may be encouraged to have this screening done every 3 years, beginning at age 52.  For women ages 64-65, health care providers may recommend pelvic exams and Pap testing every 3 years, or they may recommend the Pap and pelvic exam, combined with testing for human papilloma virus (HPV), every 5 years. Some types of HPV increase your risk of cervical cancer. Testing for HPV may also be done on women of any age with unclear Pap test results.  Other health care providers may not recommend any screening for nonpregnant women who are considered low risk for pelvic cancer and who do not have symptoms. Ask your health care provider if a screening pelvic exam is right for you.  If you have had past treatment for cervical cancer or a condition that could lead to cancer, you need Pap tests and screening for cancer for at least 20 years after your treatment. If Pap tests have been discontinued, your risk factors (such as having a new sexual partner) need to be reassessed to determine if screening should resume. Some women have medical problems that increase the chance of getting cervical cancer. In these cases, your health care provider may recommend more frequent screening and Pap  tests.  Colorectal Cancer  This type of cancer can be detected and often prevented.  Routine colorectal cancer screening usually begins at 56 years of age and continues through 56 years of age.  Your health care provider may recommend screening at an earlier age if you have risk factors for colon cancer.  Your health care provider may also recommend using home test kits to check for hidden blood in the stool.  A small camera at the end of a tube can be used to examine your colon directly (sigmoidoscopy or colonoscopy). This is done to check for the earliest forms of colorectal cancer.  Routine screening usually begins at age 63.  Direct examination of the colon should be repeated every 5-10 years through 56 years of age. However, you may need to be screened more often if early forms of precancerous polyps or small growths are found.  Skin Cancer  Check your skin from head to toe regularly.  Tell your health care provider about any new moles or changes in moles, especially if there is a change in  a mole's shape or color.  Also tell your health care provider if you have a mole that is larger than the size of a pencil eraser.  Always use sunscreen. Apply sunscreen liberally and repeatedly throughout the day.  Protect yourself by wearing long sleeves, pants, a wide-brimmed hat, and sunglasses whenever you are outside.  Heart disease, diabetes, and high blood pressure  High blood pressure causes heart disease and increases the risk of stroke. High blood pressure is more likely to develop in: ? People who have blood pressure in the high end of the normal range (130-139/85-89 mm Hg). ? People who are overweight or obese. ? People who are African American.  If you are 23-30 years of age, have your blood pressure checked every 3-5 years. If you are 30 years of age or older, have your blood pressure checked every year. You should have your blood pressure measured twice-once when you are at  a hospital or clinic, and once when you are not at a hospital or clinic. Record the average of the two measurements. To check your blood pressure when you are not at a hospital or clinic, you can use: ? An automated blood pressure machine at a pharmacy. ? A home blood pressure monitor.  If you are between 24 years and 59 years old, ask your health care provider if you should take aspirin to prevent strokes.  Have regular diabetes screenings. This involves taking a blood sample to check your fasting blood sugar level. ? If you are at a normal weight and have a low risk for diabetes, have this test once every three years after 56 years of age. ? If you are overweight and have a high risk for diabetes, consider being tested at a younger age or more often. Preventing infection Hepatitis B  If you have a higher risk for hepatitis B, you should be screened for this virus. You are considered at high risk for hepatitis B if: ? You were born in a country where hepatitis B is common. Ask your health care provider which countries are considered high risk. ? Your parents were born in a high-risk country, and you have not been immunized against hepatitis B (hepatitis B vaccine). ? You have HIV or AIDS. ? You use needles to inject street drugs. ? You live with someone who has hepatitis B. ? You have had sex with someone who has hepatitis B. ? You get hemodialysis treatment. ? You take certain medicines for conditions, including cancer, organ transplantation, and autoimmune conditions.  Hepatitis C  Blood testing is recommended for: ? Everyone born from [birth year] through 1964. ? Anyone with known risk factors for hepatitis C.  Sexually transmitted infections (STIs)  You should be screened for sexually transmitted infections (STIs) including gonorrhea and chlamydia if: ? You are sexually active and are younger than 56 years of age. ? You are older than 56 years of age and your health care provider tells  you that you are at risk for this type of infection. ? Your sexual activity has changed since you were last screened and you are at an increased risk for chlamydia or gonorrhea. Ask your health care provider if you are at risk.  If you do not have HIV, but are at risk, it may be recommended that you take a prescription medicine daily to prevent HIV infection. This is called pre-exposure prophylaxis (PrEP). You are considered at risk if: ? You are sexually active and do not regularly use condoms  or know the HIV status of your partner(s). ? You take drugs by injection. ? You are sexually active with a partner who has HIV.  Talk with your health care provider about whether you are at high risk of being infected with HIV. If you choose to begin PrEP, you should first be tested for HIV. You should then be tested every 3 months for as long as you are taking PrEP. Pregnancy  If you are premenopausal and you may become pregnant, ask your health care provider about preconception counseling.  If you may become pregnant, take 400 to 800 micrograms (mcg) of folic acid every day.  If you want to prevent pregnancy, talk to your health care provider about birth control (contraception). Osteoporosis and menopause  Osteoporosis is a disease in which the bones lose minerals and strength with aging. This can result in serious bone fractures. Your risk for osteoporosis can be identified using a bone density scan.  If you are 85 years of age or older, or if you are at risk for osteoporosis and fractures, ask your health care provider if you should be screened.  Ask your health care provider whether you should take a calcium or vitamin D supplement to lower your risk for osteoporosis.  Menopause may have certain physical symptoms and risks.  Hormone replacement therapy may reduce some of these symptoms and risks. Talk to your health care provider about whether hormone replacement therapy is right for  you. Follow these instructions at home:  Schedule regular health, dental, and eye exams.  Stay current with your immunizations.  Do not use any tobacco products including cigarettes, chewing tobacco, or electronic cigarettes.  If you are pregnant, do not drink alcohol.  If you are breastfeeding, limit how much and how often you drink alcohol.  Limit alcohol intake to no more than 1 drink per day for nonpregnant women. One drink equals 12 ounces of beer, 5 ounces of wine, or 1 ounces of hard liquor.  Do not use street drugs.  Do not share needles.  Ask your health care provider for help if you need support or information about quitting drugs.  Tell your health care provider if you often feel depressed.  Tell your health care provider if you have ever been abused or do not feel safe at home. This information is not intended to replace advice given to you by your health care provider. Make sure you discuss any questions you have with your health care provider. Document Released: 09/05/2010 Document Revised: 07/29/2015 Document Reviewed: 11/24/2014 Elsevier Interactive Patient Education  [employer].

## 2018-02-01 NOTE — Assessment & Plan Note (Signed)
Check lipid panel  Continue daily statin Regular exercise and healthy diet encouraged  

## 2018-02-02 ENCOUNTER — Encounter: Payer: Self-pay | Admitting: Internal Medicine

## 2018-02-04 ENCOUNTER — Telehealth: Payer: Self-pay

## 2018-02-04 MED ORDER — HYDROCHLOROTHIAZIDE 12.5 MG PO TABS: 12.5000 mg | ORAL_TABLET | Freq: Every day | ORAL | 1 refills | Status: DC

## 2018-02-04 MED ORDER — CANDESARTAN CILEXETIL 32 MG PO TABS: 32.0000 mg | ORAL_TABLET | Freq: Every day | ORAL | 1 refills | Status: DC

## 2018-02-04 NOTE — Telephone Encounter (Signed)
Yes ok to send separately.

## 2018-02-04 NOTE — Addendum Note (Signed)
Addended by: Mercer PodWRENN, Rosalynd Mcwright E on: 02/04/2018 01:16 PM   Modules accepted: Orders

## 2018-02-04 NOTE — Telephone Encounter (Signed)
Medications sent

## 2018-02-04 NOTE — Telephone Encounter (Signed)
Patients pharmacy does not have the combined blood pressure medication. They are asking that it is sent separately are you ok with me doing that?  Candesartan 32 mg HCTZ 12.5 mg

## 2018-02-14 ENCOUNTER — Other Ambulatory Visit: Payer: Self-pay | Admitting: Internal Medicine

## 2018-03-06 HISTORY — PX: CHOLECYSTECTOMY: SHX55

## 2018-05-02 ENCOUNTER — Encounter: Payer: Self-pay | Admitting: Internal Medicine

## 2018-06-16 NOTE — Progress Notes (Signed)
Virtual Visit via Video Note  I connected with Sheryl Lowe on 06/16/18 at  2:45 PM EDT by a video enabled telemedicine application and verified that I am speaking with the correct person using two identifiers.   I discussed the limitations of evaluation and management by telemedicine and the availability of in person appointments. The patient expressed understanding and agreed to proceed.  The patient is currently at home and I am currently at home.    No referring provider.    History of Present Illness: She is here for follow up of her chronic medical conditions.   She is not exercising regularly.  Overall she is doing well and has no concerns.  Hypertension: She is taking her medication daily. She is compliant with a low sodium diet.  She denies chest pain, palpitations, edema, shortness of breath and regular headaches. She does not monitor her blood pressure at home.    Hyperlipidemia: She is taking her medication daily. She is compliant with a low fat/cholesterol diet. She denies myalgias.   Diabetes: She is controlling her sugars with diet. She is compliant with a diabetic diet.   She feels overall she is eating fairly well and is eating less.  Anxiety:  Her anxiety level was very high earlier this month.  She feels it is better now.  Most of her anxiety at this point is centered around the coronavirus situation.  Migraines:  Her last migraine was about two weeks ago.  She takes maxalt prn and it works.  They have not increased.    GERD:  She is taking her medication daily as prescribed.  She denies any GERD symptoms and feels her GERD is well controlled.     Social History   Socioeconomic History  . Marital status: Married    Spouse name: Not on file  . Number of children: Not on file  . Years of education: Not on file  . Highest education level: Not on file  Occupational History  . Not on file  Social Needs  . Financial resource strain: Not on file  . Food  insecurity:    Worry: Not on file    Inability: Not on file  . Transportation needs:    Medical: Not on file    Non-medical: Not on file  Tobacco Use  . Smoking status: Never Smoker  . Smokeless tobacco: Never Used  Substance and Sexual Activity  . Alcohol use: Yes  . Drug use: No  . Sexual activity: Not on file  Lifestyle  . Physical activity:    Days per week: Not on file    Minutes per session: Not on file  . Stress: Not on file  Relationships  . Social connections:    Talks on phone: Not on file    Gets together: Not on file    Attends religious service: Not on file    Active member of club or organization: Not on file    Attends meetings of clubs or organizations: Not on file    Relationship status: Not on file  Other Topics Concern  . Not on file  Social History Narrative  . Not on file     Observations/Objective: Appears well in NAD   Assessment and Plan:  See Problem List for Assessment and Plan of chronic medical problems.   Follow Up Instructions:    I discussed the assessment and treatment plan with the patient. The patient was provided an opportunity to ask questions and all were answered.  The patient agreed with the plan and demonstrated an understanding of the instructions.   The patient was advised to call back or seek an in-person evaluation if the symptoms worsen or if the condition fails to improve as anticipated.    Pincus SanesStacy J Derrien Anschutz, MD

## 2018-06-17 ENCOUNTER — Ambulatory Visit (INDEPENDENT_AMBULATORY_CARE_PROVIDER_SITE_OTHER): Payer: BLUE CROSS/BLUE SHIELD | Admitting: Internal Medicine

## 2018-06-17 ENCOUNTER — Encounter: Payer: Self-pay | Admitting: Internal Medicine

## 2018-06-17 DIAGNOSIS — F411 Generalized anxiety disorder: Secondary | ICD-10-CM

## 2018-06-17 DIAGNOSIS — I10 Essential (primary) hypertension: Secondary | ICD-10-CM

## 2018-06-17 DIAGNOSIS — E7849 Other hyperlipidemia: Secondary | ICD-10-CM

## 2018-06-17 DIAGNOSIS — G43009 Migraine without aura, not intractable, without status migrainosus: Secondary | ICD-10-CM

## 2018-06-17 DIAGNOSIS — E119 Type 2 diabetes mellitus without complications: Secondary | ICD-10-CM | POA: Diagnosis not present

## 2018-06-17 DIAGNOSIS — K219 Gastro-esophageal reflux disease without esophagitis: Secondary | ICD-10-CM

## 2018-06-17 MED ORDER — CANDESARTAN CILEXETIL-HCTZ 32-12.5 MG PO TABS: 1.0000 | ORAL_TABLET | Freq: Every day | ORAL | 1 refills | Status: DC

## 2018-06-17 MED ORDER — ATORVASTATIN CALCIUM 10 MG PO TABS: 10.0000 mg | ORAL_TABLET | Freq: Every day | ORAL | 1 refills | Status: DC

## 2018-06-17 MED ORDER — RIZATRIPTAN BENZOATE 10 MG PO TABS: ORAL_TABLET | ORAL | 3 refills | Status: DC

## 2018-06-17 NOTE — Assessment & Plan Note (Signed)
Overall controlled Continue Maxalt as needed

## 2018-06-17 NOTE — Assessment & Plan Note (Signed)
Blood pressure typically well controlled Advised her to monitor at home briefly to make sure that it is controlled Continue Atacand HCTZ 32-12.5

## 2018-06-17 NOTE — Assessment & Plan Note (Signed)
Diet controlled She is compliant with a diabetic diet Currently not exercising regularly We will recheck A1c in 6 months, sooner if she is in the area

## 2018-06-17 NOTE — Assessment & Plan Note (Signed)
GERD controlled Continue daily medication  

## 2018-06-17 NOTE — Assessment & Plan Note (Signed)
Has not been taking atorvastatin New prescription sent to pharmacy We will recheck blood work in 6 months

## 2018-06-17 NOTE — Assessment & Plan Note (Signed)
Overall controlled without medication She will let me know if her anxiety increases and if we need to do something different

## 2018-10-01 ENCOUNTER — Ambulatory Visit (HOSPITAL_COMMUNITY)
Admission: RE | Admit: 2018-10-01 | Discharge: 2018-10-01 | Disposition: A | Discharge status: Home / Self Care | Payer: BC Managed Care – PPO | Source: Ambulatory Visit | Attending: Surgery | Admitting: Surgery

## 2018-10-01 ENCOUNTER — Ambulatory Visit (HOSPITAL_COMMUNITY)
Admission: RE | Admit: 2018-10-01 | Discharge: 2018-10-01 | Disposition: A | Discharge status: Home / Self Care | Payer: BC Managed Care – PPO | Source: Ambulatory Visit | Attending: Diagnostic Radiology | Admitting: Diagnostic Radiology

## 2018-10-01 ENCOUNTER — Other Ambulatory Visit (HOSPITAL_COMMUNITY): Payer: Self-pay | Admitting: Diagnostic Radiology

## 2018-10-01 ENCOUNTER — Encounter (HOSPITAL_COMMUNITY): Payer: Self-pay

## 2018-10-01 ENCOUNTER — Other Ambulatory Visit (HOSPITAL_COMMUNITY): Payer: Self-pay | Admitting: Surgery

## 2018-10-01 ENCOUNTER — Other Ambulatory Visit: Payer: Self-pay

## 2018-10-01 DIAGNOSIS — L0291 Cutaneous abscess, unspecified: Secondary | ICD-10-CM

## 2018-10-01 DIAGNOSIS — R188 Other ascites: Secondary | ICD-10-CM

## 2018-10-01 DIAGNOSIS — Y839 Surgical procedure, unspecified as the cause of abnormal reaction of the patient, or of later complication, without mention of misadventure at the time of the procedure: Secondary | ICD-10-CM | POA: Insufficient documentation

## 2018-10-01 DIAGNOSIS — T8149XA Infection following a procedure, other surgical site, initial encounter: Secondary | ICD-10-CM | POA: Insufficient documentation

## 2018-10-01 LAB — POCT I-STAT CREATININE: Creatinine, Ser: 1.1 mg/dL — ABNORMAL HIGH (ref 0.44–1.00)

## 2018-10-01 MED ORDER — FENTANYL CITRATE (PF) 100 MCG/2ML IJ SOLN
INTRAMUSCULAR | Status: DC | PRN
  Administered 2018-10-01: 50 ug via INTRAVENOUS
  Administered 2018-10-01 (×2): 25 ug via INTRAVENOUS

## 2018-10-01 MED ORDER — LIDOCAINE HCL 1 % IJ SOLN
INTRAMUSCULAR | Status: AC
  Filled 2018-10-01: qty 20

## 2018-10-01 MED ORDER — FENTANYL CITRATE (PF) 100 MCG/2ML IJ SOLN
INTRAMUSCULAR | Status: AC
  Filled 2018-10-01: qty 2

## 2018-10-01 MED ORDER — SODIUM CHLORIDE 0.9% FLUSH: 5.0000 mL | Freq: Three times a day (TID) | INTRAVENOUS | Status: DC

## 2018-10-01 MED ORDER — MIDAZOLAM HCL 2 MG/2ML IJ SOLN
INTRAMUSCULAR | Status: DC | PRN
  Administered 2018-10-01: 1 mg via INTRAVENOUS
  Administered 2018-10-01 (×2): 0.5 mg via INTRAVENOUS

## 2018-10-01 MED ORDER — MIDAZOLAM HCL 2 MG/2ML IJ SOLN
INTRAMUSCULAR | Status: AC
  Filled 2018-10-01: qty 2

## 2018-10-01 NOTE — Procedures (Signed)
Interventional Radiology Procedure:   Indications: Post operative abscess at gallbladder fossa  Procedure: CT guided drain placement  Findings: Large fluid collection with gas.  Removed 150 ml of clay colored purulent fluid along with gas.  Complications: None     EBL: less than 10 ml  Plan: Send fluid for culture and bilirubin.  Patient will return to [facility]. Anselm Pancoast, MD  Pager: [phone]

## 2018-10-01 NOTE — Consult Note (Signed)
Chief Complaint: Patient was seen in consultation today for gall bladder fossa abscess  Referring Physician(s): Dr. Marcha Soldersathey  Supervising Physician: Richarda OverlieHenn, Adam  [facility]  History of Present Illness: Sheryl Lowe is a 58 y.o. female with past medical history of anxiety, HTN, HLD, kidney stones who presented to [facility] with abdominal pain 1 week ago.  Patient underwent cholecystectomy 1 week ago and was discharged home in improved condition. She reports she started feeling poorly at home and progressively developed nausea, vomiting, and fevers.  [facility] and was found to have an intra-abdominal fluid collection within the gall bladder fossa suspicious for abscess vs. Hematoma.   Patient assessed today and found to be stable. Her vital signs are stable.  She did have an elevated temperature overnight of 102F.  She does have RUQ tenderness.  She has been NPO.  Case reviewed and approved by Dr. Lowella DandyHenn.  Patient [facility] to [facility] for procedure.  Plan is to [facility] post-procedure.   Past Medical History:  Diagnosis Date  . Anxiety   . Hyperlipidemia   . Hypertension   . Kidney stones   . Migraine     No past surgical history on file.  Allergies: Amitriptyline and Topamax [topiramate]  Medications: Prior to Admission medications   Medication Sig Start Date End Date Taking? Authorizing Provider  atorvastatin (LIPITOR) 10 MG tablet Take 1 tablet (10 mg total) by mouth daily. 06/17/18   Pincus SanesBurns, Stacy J, MD  candesartan-hydrochlorothiazide (ATACAND HCT) 32-12.5 MG tablet Take 1 tablet by mouth daily. 06/17/18   Pincus SanesBurns, Stacy J, MD  omeprazole (PRILOSEC) 20 MG capsule Take 1 capsule (20 mg total) by mouth daily. 03/26/15   Pincus SanesBurns, Stacy J, MD  rizatriptan (MAXALT) 10 MG tablet TAKE 1 TABLET BY MOUTH AS NEEDED FOR MIGRAINE**MAY REPEAT IN 2 HOURS IF NEEDED** 06/17/18   Pincus SanesBurns, Stacy J, MD     Family  History  Problem Relation Age of Onset  . Stroke Mother   . Heart disease Mother   . Hypertension Father   . Cancer Father     Social History   Socioeconomic History  . Marital status: Married    Spouse name: Not on file  . Number of children: Not on file  . Years of education: Not on file  . Highest education level: Not on file  Occupational History  . Not on file  Social Needs  . Financial resource strain: Not on file  . Food insecurity    Worry: Not on file    Inability: Not on file  . Transportation needs    Medical: Not on file    Non-medical: Not on file  Tobacco Use  . Smoking status: Never Smoker  . Smokeless tobacco: Never Used  Substance and Sexual Activity  . Alcohol use: Yes  . Drug use: No  . Sexual activity: Not on file  Lifestyle  . Physical activity    Days per week: Not on file    Minutes per session: Not on file  . Stress: Not on file  Relationships  . Social Musicianconnections    Talks on phone: Not on file    Gets together: Not on file    Attends religious service: Not on file    Active member of club or organization: Not on file    Attends meetings of clubs or organizations: Not on file    Relationship status: Not on file  Other Topics Concern  . Not on file  Social History Narrative  . Not on file     Review of Systems: A 12 point ROS discussed and pertinent positives are indicated in the HPI above.  All other systems are negative.  Review of Systems  Constitutional: Positive for fatigue and fever.  Respiratory: Negative for cough and shortness of breath.   Gastrointestinal: Positive for abdominal pain, nausea and vomiting.  Musculoskeletal: Negative for back pain.  Psychiatric/Behavioral: Negative for behavioral problems and confusion.    Vital Signs: There were no vitals taken for this visit.  Physical Exam Vitals signs and nursing note reviewed.  Constitutional:      Appearance: Normal appearance.  HENT:     Mouth/Throat:      Mouth: Mucous membranes are moist.     Pharynx: Oropharynx is clear.  Cardiovascular:     Rate and Rhythm: Normal rate and regular rhythm.     Heart sounds: No murmur. No friction rub. No gallop.   Pulmonary:     Effort: Pulmonary effort is normal. No respiratory distress.     Breath sounds: Normal breath sounds.  Skin:    General: Skin is warm and dry.  Neurological:     General: No focal deficit present.     Mental Status: She is alert and oriented to person, place, and time. Mental status is at baseline.  Psychiatric:        Mood and Affect: Mood normal.        Behavior: Behavior normal.        Thought Content: Thought content normal.        Judgment: Judgment normal.      MD Evaluation Airway: WNL Heart: WNL Abdomen: WNL Chest/ Lungs: WNL ASA  Classification: 3 Mallampati/Airway Score: Two   Imaging: No results found.  Labs:  CBC: Recent Labs    02/01/18 0934  WBC 9.8  HGB 12.2  HCT 36.5  PLT 254.0    COAGS: No results for input(s): INR, APTT in the last 8760 hours.  BMP: Recent Labs    02/01/18 0934  NA 145  K 3.8  CL 110  CO2 26  GLUCOSE 105*  BUN 17  CALCIUM 9.3  CREATININE 0.87    LIVER FUNCTION TESTS: Recent Labs    02/01/18 0934  BILITOT 0.4  AST 14  ALT 12  ALKPHOS 83  PROT 7.2  ALBUMIN 3.9    TUMOR MARKERS: No results for input(s): AFPTM, CEA, CA199, CHROMGRNA in the last 8760 hours.  Assessment and Plan: Gall bladder fossa abscess vs. hematoma Patient s/p cholecystectomy 1 week ago, now with intra-abdominal fluid collection.  Case reviewed by Dr. Anselm Pancoast who approves patient for procedure.  Patient NPO.  She is on Augmentin.  INR 1.0.   Risks and benefits discussed with the patient including bleeding, infection, damage to adjacent structures, bowel perforation/fistula connection, and sepsis.  All of the patient's questions were answered, patient is agreeable to proceed. Consent signed and in chart.  Thank you for  this interesting consult.  I greatly enjoyed meeting [name] and look forward to participating in their care.  A copy of this report was sent to the requesting provider on this date.  Electronically Signed: Docia Barrier, PA 10/01/2018, 10:19 AM   I spent a total of 40 Minutes    in face to face in clinical consultation, greater than 50% of which was counseling/coordinating care for intra-abdominal fluid collection.

## 2018-10-02 ENCOUNTER — Other Ambulatory Visit: Payer: Self-pay | Admitting: Student

## 2018-10-02 DIAGNOSIS — R188 Other ascites: Secondary | ICD-10-CM

## 2018-10-03 ENCOUNTER — Other Ambulatory Visit: Payer: Self-pay | Admitting: Student

## 2018-10-03 ENCOUNTER — Other Ambulatory Visit: Payer: Self-pay | Admitting: Surgery

## 2018-10-03 DIAGNOSIS — R188 Other ascites: Secondary | ICD-10-CM

## 2018-10-06 LAB — AEROBIC/ANAEROBIC CULTURE W GRAM STAIN (SURGICAL/DEEP WOUND)

## 2018-10-06 LAB — TOTAL BILIRUBIN, BODY FLUID

## 2018-10-08 ENCOUNTER — Other Ambulatory Visit: Payer: Self-pay | Admitting: Surgery

## 2018-10-08 DIAGNOSIS — K810 Acute cholecystitis: Secondary | ICD-10-CM

## 2018-10-08 DIAGNOSIS — R188 Other ascites: Secondary | ICD-10-CM

## 2018-10-15 ENCOUNTER — Other Ambulatory Visit: Payer: BC Managed Care – PPO

## 2019-02-12 ENCOUNTER — Encounter: Payer: Self-pay | Admitting: Internal Medicine

## 2019-02-17 ENCOUNTER — Other Ambulatory Visit: Payer: Self-pay

## 2019-02-18 ENCOUNTER — Other Ambulatory Visit: Payer: Self-pay

## 2019-02-18 MED ORDER — ATORVASTATIN CALCIUM 10 MG PO TABS: 10.0000 mg | ORAL_TABLET | Freq: Every day | ORAL | 0 refills | Status: DC

## 2019-02-18 MED ORDER — CANDESARTAN CILEXETIL-HCTZ 32-12.5 MG PO TABS: 1.0000 | ORAL_TABLET | Freq: Every day | ORAL | 0 refills | Status: DC

## 2019-03-21 ENCOUNTER — Ambulatory Visit: Payer: BC Managed Care – PPO | Admitting: Internal Medicine

## 2019-05-19 ENCOUNTER — Telehealth: Payer: Self-pay | Admitting: Internal Medicine

## 2019-05-19 MED ORDER — CANDESARTAN CILEXETIL-HCTZ 32-12.5 MG PO TABS: 1.0000 | ORAL_TABLET | Freq: Every day | ORAL | 0 refills | Status: DC

## 2019-05-19 MED ORDER — ATORVASTATIN CALCIUM 10 MG PO TABS: 10.0000 mg | ORAL_TABLET | Freq: Every day | ORAL | 0 refills | Status: DC

## 2019-05-19 NOTE — Telephone Encounter (Signed)
    Patient requesting short supply until 3/24 appt.   1.Medication Requested: atorvastatin (LIPITOR) 10 MG tablet candesartan-hydrochlorothiazide (ATACAND HCT) 32-12.5 MG tablet   2. Pharmacy (Name, Street, [id number] - [place], [place] - [place] [address]  3. On Med List: yes  4. Last Visit with PCP: 06/17/18  5. Next visit date with PCP:05/28/19   Agent: Please be advised that RX refills may take up to 3 business days. We ask that you follow-up with your pharmacy.

## 2019-05-19 NOTE — Telephone Encounter (Signed)
Rx sent 

## 2019-05-27 ENCOUNTER — Ambulatory Visit: Payer: BC Managed Care – PPO | Admitting: Internal Medicine

## 2019-05-27 ENCOUNTER — Encounter: Payer: Self-pay | Admitting: Internal Medicine

## 2019-05-27 NOTE — Patient Instructions (Addendum)
Blood work was ordered.    All other Health Maintenance issues reviewed.   All recommended immunizations and age-appropriate screenings are up-to-date or discussed.  No immunization administered today.   Medications reviewed and updated.  Changes include :   celexa 10 mg daily for anxiety  Your prescription(s) have been submitted to your pharmacy. Please take as directed and contact our office if you believe you are having problem(s) with the medication(s).    Please followup in 6 months    Health Maintenance, Female Adopting a healthy lifestyle and getting preventive care are important in promoting health and wellness. Ask your health care provider about:  The right schedule for you to have regular tests and exams.  Things you can do on your own to prevent diseases and keep yourself healthy. What should I know about diet, weight, and exercise? Eat a healthy diet   Eat a diet that includes plenty of vegetables, fruits, low-fat dairy products, and lean protein.  Do not eat a lot of foods that are high in solid fats, added sugars, or sodium. Maintain a healthy weight Body mass index (BMI) is used to identify weight problems. It estimates body fat based on height and weight. Your health care provider can help determine your BMI and help you achieve or maintain a healthy weight. Get regular exercise Get regular exercise. This is one of the most important things you can do for your health. Most adults should:  Exercise for at least 150 minutes each week. The exercise should increase your heart rate and make you sweat (moderate-intensity exercise).  Do strengthening exercises at least twice a week. This is in addition to the moderate-intensity exercise.  Spend less time sitting. Even light physical activity can be beneficial. Watch cholesterol and blood lipids Have your blood tested for lipids and cholesterol at 58 years of age, then have this test every 5 years. Have your  cholesterol levels checked more often if:  Your lipid or cholesterol levels are high.  You are older than 58 years of age.  You are at high risk for heart disease. What should I know about cancer screening? Depending on your health history and family history, you may need to have cancer screening at various ages. This may include screening for:  Breast cancer.  Cervical cancer.  Colorectal cancer.  Skin cancer.  Lung cancer. What should I know about heart disease, diabetes, and high blood pressure? Blood pressure and heart disease  High blood pressure causes heart disease and increases the risk of stroke. This is more likely to develop in people who have high blood pressure readings, are of African descent, or are overweight.  Have your blood pressure checked: ? Every 3-5 years if you are 47-65 years of age. ? Every year if you are 29 years old or older. Diabetes Have regular diabetes screenings. This checks your fasting blood sugar level. Have the screening done:  Once every three years after age 83 if you are at a normal weight and have a low risk for diabetes.  More often and at a younger age if you are overweight or have a high risk for diabetes. What should I know about preventing infection? Hepatitis B If you have a higher risk for hepatitis B, you should be screened for this virus. Talk with your health care provider to find out if you are at risk for hepatitis B infection. Hepatitis C Testing is recommended for:  Everyone born from [birth year] through 1964.  Anyone with  known risk factors for hepatitis C. Sexually transmitted infections (STIs)  Get screened for STIs, including gonorrhea and chlamydia, if: ? You are sexually active and are younger than 58 years of age. ? You are older than 58 years of age and your health care provider tells you that you are at risk for this type of infection. ? Your sexual activity has changed since you were last screened, and you are  at increased risk for chlamydia or gonorrhea. Ask your health care provider if you are at risk.  Ask your health care provider about whether you are at high risk for HIV. Your health care provider may recommend a prescription medicine to help prevent HIV infection. If you choose to take medicine to prevent HIV, you should first get tested for HIV. You should then be tested every 3 months for as long as you are taking the medicine. Pregnancy  If you are about to stop having your period (premenopausal) and you may become pregnant, seek counseling before you get pregnant.  Take 400 to 800 micrograms (mcg) of folic acid every day if you become pregnant.  Ask for birth control (contraception) if you want to prevent pregnancy. Osteoporosis and menopause Osteoporosis is a disease in which the bones lose minerals and strength with aging. This can result in bone fractures. If you are 63 years old or older, or if you are at risk for osteoporosis and fractures, ask your health care provider if you should:  Be screened for bone loss.  Take a calcium or vitamin D supplement to lower your risk of fractures.  Be given hormone replacement therapy (HRT) to treat symptoms of menopause. Follow these instructions at home: Lifestyle  Do not use any products that contain nicotine or tobacco, such as cigarettes, e-cigarettes, and chewing tobacco. If you need help quitting, ask your health care provider.  Do not use street drugs.  Do not share needles.  Ask your health care provider for help if you need support or information about quitting drugs. Alcohol use  Do not drink alcohol if: ? Your health care provider tells you not to drink. ? You are pregnant, may be pregnant, or are planning to become pregnant.  If you drink alcohol: ? Limit how much you use to 0-1 drink a day. ? Limit intake if you are breastfeeding.  Be aware of how much alcohol is in your drink. In the [place] one drink equals one 12 oz  bottle of beer (355 mL), one 5 oz glass of wine (148 mL), or one 1 oz glass of hard liquor (44 mL). General instructions  Schedule regular health, dental, and eye exams.  Stay current with your vaccines.  Tell your health care provider if: ? You often feel depressed. ? You have ever been abused or do not feel safe at home. Summary  Adopting a healthy lifestyle and getting preventive care are important in promoting health and wellness.  Follow your health care provider's instructions about healthy diet, exercising, and getting tested or screened for diseases.  Follow your health care provider's instructions on monitoring your cholesterol and blood pressure. This information is not intended to replace advice given to you by your health care provider. Make sure you discuss any questions you have with your health care provider. Document Revised: 02/13/2018 Document Reviewed: 02/13/2018 Elsevier Patient Education  2020 [employer].

## 2019-05-27 NOTE — Progress Notes (Signed)
Subjective:    Patient ID: Sheryl Lowe, female    DOB: 1960/03/18, 59 y.o.   MRN: [id number]  HPI She is here for a physical exam.   She had covid earlier this year.  She had the first covid vaccine.   She is not exercising regularly.  She is not eating as well as she should.   Medications and allergies reviewed with patient and updated if appropriate.  Patient Active Problem List   Diagnosis Date Noted  . GAD (generalized anxiety disorder) 02/01/2018  . Diabetes mellitus without complication ([place]) 82/50/5397  . Overweight 05/03/2016  . Chronic headaches 03/27/2015  . GERD (gastroesophageal reflux disease) 03/27/2015  . Hyperlipidemia 03/26/2015  . Essential hypertension, benign 03/26/2015  . Migraine 03/26/2015    Current Outpatient Medications on File Prior to Visit  Medication Sig Dispense Refill  . atorvastatin (LIPITOR) 10 MG tablet Take 1 tablet (10 mg total) by mouth daily. 90 tablet 0  . candesartan-hydrochlorothiazide (ATACAND HCT) 32-12.5 MG tablet Take 1 tablet by mouth daily. 90 tablet 0  . omeprazole (PRILOSEC) 20 MG capsule Take 1 capsule (20 mg total) by mouth daily. 30 capsule 3  . rizatriptan (MAXALT) 10 MG tablet TAKE 1 TABLET BY MOUTH AS NEEDED FOR MIGRAINE**MAY REPEAT IN 2 HOURS IF NEEDED** 27 tablet 3   No current facility-administered medications on file prior to visit.    Past Medical History:  Diagnosis Date  . Anxiety   . Hyperlipidemia   . Hypertension   . Kidney stones   . Migraine     Past Surgical History:  Procedure Laterality Date  . CHOLECYSTECTOMY  2020    Social History   Socioeconomic History  . Marital status: Married    Spouse name: Not on file  . Number of children: Not on file  . Years of education: Not on file  . Highest education level: Not on file  Occupational History  . Not on file  Tobacco Use  . Smoking status: Never Smoker  . Smokeless tobacco: Never Used  Substance and Sexual Activity  . Alcohol use:  Yes  . Drug use: No  . Sexual activity: Not on file  Other Topics Concern  . Not on file  Social History Narrative  . Not on file   Social Determinants of Health   Financial Resource Strain:   . Difficulty of Paying Living Expenses:   Food Insecurity:   . Worried About Charity fundraiser in the Last Year:   . Arboriculturist in the Last Year:   Transportation Needs:   . Film/video editor (Medical):   Marland Kitchen Lack of Transportation (Non-Medical):   Physical Activity:   . Days of Exercise per Week:   . Minutes of Exercise per Session:   Stress:   . Feeling of Stress :   Social Connections:   . Frequency of Communication with Friends and Family:   . Frequency of Social Gatherings with Friends and Family:   . Attends Religious Services:   . Active Member of Clubs or Organizations:   . Attends Archivist Meetings:   Marland Kitchen Marital Status:     Family History  Problem Relation Age of Onset  . Stroke Mother   . Heart disease Mother   . Hypertension Father   . Cancer Father     Review of Systems  Constitutional: Negative for chills and fever.  Eyes: Negative for visual disturbance.  Respiratory: Negative for cough, shortness of breath  and wheezing.   Cardiovascular: Negative for chest pain, palpitations and leg swelling.  Gastrointestinal: Negative for abdominal pain, blood in stool, constipation, diarrhea and nausea.  Genitourinary: Negative for dysuria and hematuria.  Musculoskeletal: Negative for arthralgias and back pain.  Skin: Negative for color change and rash.  Neurological: Positive for headaches (migraines on occ). Negative for dizziness, light-headedness and numbness.  Psychiatric/Behavioral: Negative for dysphoric mood and sleep disturbance. The patient is nervous/anxious.        Objective:   Vitals:   05/28/19 0802  BP: 134/80  Pulse: 67  Resp: 16  Temp: 98.1 F (36.7 C)  SpO2: 99%   Filed Weights   05/28/19 0802  Weight: 150 lb 12.8 oz  (68.4 kg)   Body mass index is 28.49 kg/m.  BP Readings from Last 3 Encounters:  05/28/19 134/80  10/01/18 100/65  10/01/18 (!) 116/56    Wt Readings from Last 3 Encounters:  05/28/19 150 lb 12.8 oz (68.4 kg)  02/01/18 155 lb (70.3 kg)  03/05/17 153 lb (69.4 kg)     Physical Exam Constitutional: She appears well-developed and well-nourished. No distress.  HENT:  Head: Normocephalic and atraumatic.  Right Ear: External ear normal. Normal ear canal and TM Left Ear: External ear normal.  Normal ear canal and TM Mouth/Throat: Oropharynx is clear and moist.  Eyes: Conjunctivae and EOM are normal.  Neck: Neck supple. No tracheal deviation present. No thyromegaly present.  No carotid bruit  Cardiovascular: Normal rate, regular rhythm and normal heart sounds.   No murmur heard.  No edema. Pulmonary/Chest: Effort normal and breath sounds normal. No respiratory distress. She has no wheezes. She has no rales.  Breast: deferred   Abdominal: Soft. She exhibits no distension. There is no tenderness.  Lymphadenopathy: She has no cervical adenopathy.  Skin: Skin is warm and dry. She is not diaphoretic.  Psychiatric: She has a normal mood and affect. Her behavior is normal.        Assessment & Plan:   Physical exam: Screening blood work    ordered Immunizations  Had covid #1, deferred others due to just having had covid vaccine Colonoscopy   Never had one - will do cologuard - will have it sent to house --discussed benefits/risks of cologuard vs colonoscopy - encouraged colonoscopy Mammogram  Will schedule Gyn  Will schedule Dexa  N/a  Eye exams  Up to date    Exercise  none Weight  Advised weight loss Substance abuse   none  See Problem List for Assessment and Plan of chronic medical problems.    This visit occurred during the SARS-CoV-2 public health emergency.  Safety protocols were in place, including screening questions prior to the visit, additional usage of staff PPE,  and extensive cleaning of exam room while observing appropriate contact time as indicated for disinfecting solutions.

## 2019-05-28 ENCOUNTER — Encounter: Payer: Self-pay | Admitting: Internal Medicine

## 2019-05-28 ENCOUNTER — Other Ambulatory Visit: Payer: Self-pay

## 2019-05-28 ENCOUNTER — Ambulatory Visit: Payer: BC Managed Care – PPO | Admitting: Internal Medicine

## 2019-05-28 VITALS — BP 134/80 | HR 67 | Temp 98.1°F | Resp 16 | Ht 61.0 in | Wt 150.8 lb

## 2019-05-28 DIAGNOSIS — E7849 Other hyperlipidemia: Secondary | ICD-10-CM | POA: Diagnosis not present

## 2019-05-28 DIAGNOSIS — Z0000 Encounter for general adult medical examination without abnormal findings: Secondary | ICD-10-CM | POA: Diagnosis not present

## 2019-05-28 DIAGNOSIS — E119 Type 2 diabetes mellitus without complications: Secondary | ICD-10-CM | POA: Diagnosis not present

## 2019-05-28 DIAGNOSIS — G43009 Migraine without aura, not intractable, without status migrainosus: Secondary | ICD-10-CM

## 2019-05-28 DIAGNOSIS — I10 Essential (primary) hypertension: Secondary | ICD-10-CM | POA: Diagnosis not present

## 2019-05-28 DIAGNOSIS — F411 Generalized anxiety disorder: Secondary | ICD-10-CM

## 2019-05-28 DIAGNOSIS — K219 Gastro-esophageal reflux disease without esophagitis: Secondary | ICD-10-CM

## 2019-05-28 DIAGNOSIS — E663 Overweight: Secondary | ICD-10-CM

## 2019-05-28 LAB — COMPREHENSIVE METABOLIC PANEL
ALT: 18 U/L (ref 0–35)
AST: 17 U/L (ref 0–37)
Albumin: 4.3 g/dL (ref 3.5–5.2)
Alkaline Phosphatase: 79 U/L (ref 39–117)
BUN: 26 mg/dL — ABNORMAL HIGH (ref 6–23)
CO2: 30 mEq/L (ref 19–32)
Calcium: 10 mg/dL (ref 8.4–10.5)
Chloride: 102 mEq/L (ref 96–112)
Creatinine, Ser: 0.91 mg/dL (ref 0.40–1.20)
GFR: 63.45 mL/min (ref >60.00)
Glucose, Bld: 106 mg/dL — ABNORMAL HIGH (ref 70–99)
Potassium: 4 mEq/L (ref 3.5–5.1)
Sodium: 141 mEq/L (ref 135–145)
Total Bilirubin: 0.3 mg/dL (ref 0.2–1.2)
Total Protein: 8.1 g/dL (ref 6.0–8.3)

## 2019-05-28 LAB — CBC WITH DIFFERENTIAL/PLATELET
Basophils Absolute: 0 10*3/uL (ref 0.0–0.1)
Basophils Relative: 0.5 % (ref 0.0–3.0)
Eosinophils Absolute: 0.2 10*3/uL (ref 0.0–0.7)
Eosinophils Relative: 2.6 % (ref 0.0–5.0)
HCT: 37.1 % (ref 36.0–46.0)
Hemoglobin: 12.5 g/dL (ref 12.0–15.0)
Lymphocytes Relative: 36.5 % (ref 12.0–46.0)
Lymphs Abs: 2.4 10*3/uL (ref 0.7–4.0)
MCHC: 33.8 g/dL (ref 30.0–36.0)
MCV: 87.7 fl (ref 78.0–100.0)
Monocytes Absolute: 0.6 10*3/uL (ref 0.1–1.0)
Monocytes Relative: 9.1 % (ref 3.0–12.0)
Neutro Abs: 3.4 10*3/uL (ref 1.4–7.7)
Neutrophils Relative %: 51.3 % (ref 43.0–77.0)
Platelets: 231 10*3/uL (ref 150.0–400.0)
RBC: 4.23 Mil/uL (ref 3.87–5.11)
RDW: 14.2 % (ref 11.5–15.5)
WBC: 6.6 10*3/uL (ref 4.0–10.5)

## 2019-05-28 LAB — TSH: TSH: 0.72 u[IU]/mL (ref 0.35–4.50)

## 2019-05-28 LAB — LIPID PANEL
Cholesterol: 173 mg/dL (ref 0–200)
HDL: 31.1 mg/dL — ABNORMAL LOW (ref >39.00)
NonHDL: 142.05
Total CHOL/HDL Ratio: 6
Triglycerides: 232 mg/dL — ABNORMAL HIGH (ref 0.0–149.0)
VLDL: 46.4 mg/dL — ABNORMAL HIGH (ref 0.0–40.0)

## 2019-05-28 LAB — LDL CHOLESTEROL, DIRECT: Direct LDL: 109 mg/dL

## 2019-05-28 LAB — HEMOGLOBIN A1C: Hgb A1c MFr Bld: 6.5 % (ref 4.6–6.5)

## 2019-05-28 MED ORDER — CITALOPRAM HYDROBROMIDE 10 MG PO TABS: 10.0000 mg | ORAL_TABLET | Freq: Every day | ORAL | 5 refills | Status: DC

## 2019-05-28 NOTE — Assessment & Plan Note (Signed)
High anxiety level from work She feels anxious during the day.  She sleeps well She thinks she needs something to help Start celexa 10 mg daily

## 2019-05-28 NOTE — Assessment & Plan Note (Signed)
Chronic BP well controlled Current regimen effective and well tolerated Continue current medications at current doses cmp  

## 2019-05-28 NOTE — Assessment & Plan Note (Signed)
chronic Controlled maxalt as needed - does not take often

## 2019-05-28 NOTE — Assessment & Plan Note (Signed)
Chronic Check lipid panel  Continue daily statin Regular exercise and healthy diet encouraged  

## 2019-05-28 NOTE — Assessment & Plan Note (Signed)
Chronic Check a1c Low sugar / carb diet Stressed regular exercise Encouraged weight loss 

## 2019-05-28 NOTE — Assessment & Plan Note (Signed)
Chronic GERD controlled Continue daily medication  

## 2019-05-28 NOTE — Assessment & Plan Note (Signed)
Chronic Advised weight loss Encouraged weight loss Improve diet, decreased portions

## 2019-07-01 ENCOUNTER — Encounter: Payer: Self-pay | Admitting: Internal Medicine

## 2019-07-01 MED ORDER — RIZATRIPTAN BENZOATE 10 MG PO TABS: ORAL_TABLET | ORAL | 3 refills | Status: DC

## 2019-08-22 ENCOUNTER — Other Ambulatory Visit: Payer: Self-pay | Admitting: Internal Medicine

## 2019-08-27 ENCOUNTER — Other Ambulatory Visit: Payer: Self-pay | Admitting: Internal Medicine

## 2019-08-27 MED ORDER — ATORVASTATIN CALCIUM 10 MG PO TABS: 10.0000 mg | ORAL_TABLET | Freq: Every day | ORAL | 0 refills | Status: DC

## 2019-08-27 MED ORDER — CANDESARTAN CILEXETIL-HCTZ 32-12.5 MG PO TABS: 1.0000 | ORAL_TABLET | Freq: Every day | ORAL | 0 refills | Status: DC

## 2019-08-29 ENCOUNTER — Telehealth: Payer: Self-pay | Admitting: Internal Medicine

## 2019-08-29 NOTE — Telephone Encounter (Signed)
New Message:   Case#: [id number]  Ninfa Linden is calling from [facility] about a order for a cologuard placed by Dr. Lawerance Bach for the pt. Please advise.

## 2019-09-15 NOTE — Telephone Encounter (Signed)
Tried calling a few times but unable to reach [facility].

## 2019-09-24 ENCOUNTER — Other Ambulatory Visit: Payer: Self-pay | Admitting: Internal Medicine

## 2019-10-20 ENCOUNTER — Other Ambulatory Visit: Payer: Self-pay | Admitting: Internal Medicine

## 2019-10-25 ENCOUNTER — Encounter: Payer: Self-pay | Admitting: Internal Medicine

## 2019-10-26 MED ORDER — CANDESARTAN CILEXETIL-HCTZ 32-12.5 MG PO TABS: 1.0000 | ORAL_TABLET | Freq: Every day | ORAL | 1 refills | Status: DC

## 2019-10-26 MED ORDER — ATORVASTATIN CALCIUM 10 MG PO TABS: 10.0000 mg | ORAL_TABLET | Freq: Every day | ORAL | 1 refills | Status: DC

## 2019-11-28 ENCOUNTER — Ambulatory Visit: Payer: BC Managed Care – PPO | Admitting: Internal Medicine

## 2019-12-26 ENCOUNTER — Ambulatory Visit: Payer: BC Managed Care – PPO | Admitting: Internal Medicine

## 2020-02-12 ENCOUNTER — Other Ambulatory Visit: Payer: Self-pay | Admitting: Internal Medicine

## 2020-02-12 ENCOUNTER — Telehealth: Payer: BC Managed Care – PPO | Admitting: Physician Assistant

## 2020-02-12 DIAGNOSIS — R059 Cough, unspecified: Secondary | ICD-10-CM

## 2020-02-12 DIAGNOSIS — J069 Acute upper respiratory infection, unspecified: Secondary | ICD-10-CM | POA: Diagnosis not present

## 2020-02-12 MED ORDER — BENZONATATE 100 MG PO CAPS: 100.0000 mg | ORAL_CAPSULE | Freq: Three times a day (TID) | ORAL | 0 refills | Status: DC | PRN

## 2020-02-12 MED ORDER — FLUTICASONE PROPIONATE 50 MCG/ACT NA SUSP
2.0000 | Freq: Every day | NASAL | 6 refills | Status: DC
Start: 2020-02-12 — End: 2020-11-25

## 2020-02-12 NOTE — Progress Notes (Signed)
We are sorry you are not feeling well.  Here is how we plan to help! ° °Based on what you have shared with me, it looks like you may have a viral upper respiratory infection.  Upper respiratory infections are caused by a large number of viruses; however, rhinovirus is the most common cause.  ° °Symptoms vary from person to person, with common symptoms including sore throat, cough, fatigue or lack of energy and feeling of general discomfort.  A low-grade fever of up to 100.4 may present, but is often uncommon.  Symptoms vary however, and are closely related to a person's age or underlying illnesses.  The most common symptoms associated with an upper respiratory infection are nasal discharge or congestion, cough, sneezing, headache and pressure in the ears and face.  These symptoms usually persist for about 3 to 10 days, but can last up to 2 weeks.  It is important to know that upper respiratory infections do not cause serious illness or complications in most cases.   ° °Upper respiratory infections can be transmitted from person to person, with the most common method of transmission being a person's hands.  The virus is able to live on the skin and can infect other persons for up to 2 hours after direct contact.  Also, these can be transmitted when someone coughs or sneezes; thus, it is important to cover the mouth to reduce this risk.  To keep the spread of the illness at bay, good hand hygiene is very important. ° °This is an infection that is most likely caused by a virus. There are no specific treatments other than to help you with the symptoms until the infection runs its course.  We are sorry you are not feeling well.  Here is how we plan to help! ° ° °For nasal congestion, you may use an oral decongestants such as Mucinex D or if you have glaucoma or high blood pressure use plain Mucinex.  Saline nasal spray or nasal drops can help and can safely be used as often as needed for congestion.  For your congestion,  I have prescribed Fluticasone nasal spray one spray in each nostril twice a day ° °If you do not have a history of heart disease, hypertension, diabetes or thyroid disease, prostate/bladder issues or glaucoma, you may also use Sudafed to treat nasal congestion.  It is highly recommended that you consult with a pharmacist or your primary care physician to ensure this medication is safe for you to take.    ° °If you have a cough, you may use cough suppressants such as Delsym and Robitussin.  If you have glaucoma or high blood pressure, you can also use Coricidin HBP.   °For cough I have prescribed for you A prescription cough medication called Tessalon Perles 100 mg. You may take 1-2 capsules every 8 hours as needed for cough ° °If you have a sore or scratchy throat, use a saltwater gargle- ¼ to ½ teaspoon of salt dissolved in a 4-ounce to 8-ounce glass of warm water.  Gargle the solution for approximately 15-30 seconds and then spit.  It is important not to swallow the solution.  You can also use throat lozenges/cough drops and Chloraseptic spray to help with throat pain or discomfort.  Warm or cold liquids can also be helpful in relieving throat pain. ° °For headache, pain or general discomfort, you can use Ibuprofen or Tylenol as directed.   °Some authorities believe that zinc sprays or the use of   Echinacea may shorten the course of your symptoms. ° ° °HOME CARE °• Only take medications as instructed by your medical team. °• Be sure to drink plenty of fluids. Water is fine as well as fruit juices, sodas and electrolyte beverages. You may want to stay away from caffeine or alcohol. If you are nauseated, try taking small sips of liquids. How do you know if you are getting enough fluid? Your urine should be a pale yellow or almost colorless. °• Get rest. °• Taking a steamy shower or using a humidifier may help nasal congestion and ease sore throat pain. You can place a towel over your head and breathe in the steam  from hot water coming from a faucet. °• Using a saline nasal spray works much the same way. °• Cough drops, hard candies and sore throat lozenges may ease your cough. °• Avoid close contacts especially the very young and the elderly °• Cover your mouth if you cough or sneeze °• Always remember to wash your hands.  ° °GET HELP RIGHT AWAY IF: °• You develop worsening fever. °• If your symptoms do not improve within 10 days °• You develop yellow or green discharge from your nose over 3 days. °• You have coughing fits °• You develop a severe head ache or visual changes. °• You develop shortness of breath, difficulty breathing or start having chest pain °• Your symptoms persist after you have completed your treatment plan ° °MAKE SURE YOU  °· Understand these instructions. °· Will watch your condition. °· Will get help right away if you are not doing well or get worse. ° °Your e-visit answers were reviewed by a board certified advanced clinical practitioner to complete your personal care plan. Depending upon the condition, your plan could have included both over the counter or prescription medications. °Please review your pharmacy choice. If there is a problem, you may call our nursing hot line at and have the prescription routed to another pharmacy. °Your safety is important to us. If you have drug allergies check your prescription carefully.  ° °You can use MyChart to ask questions about today’s visit, request a non-urgent call back, or ask for a work or school excuse for 24 hours related to this e-Visit. If it has been greater than 24 hours you will need to follow up with your provider, or enter a new e-Visit to address those concerns. °You will get an e-mail in the next two days asking about your experience.  I hope that your e-visit has been valuable and will speed your recovery. Thank you for using e-visits. ° ° ° ° °Greater than 5 minutes, yet less than 10 minutes of time have been spent researching, coordinating  and implementing care for this patient today.  ° °

## 2020-02-20 ENCOUNTER — Other Ambulatory Visit: Payer: Self-pay | Admitting: Internal Medicine

## 2020-05-14 ENCOUNTER — Other Ambulatory Visit: Payer: Self-pay | Admitting: Internal Medicine

## 2020-06-23 ENCOUNTER — Other Ambulatory Visit: Payer: Self-pay | Admitting: Internal Medicine

## 2020-08-13 ENCOUNTER — Other Ambulatory Visit: Payer: Self-pay | Admitting: Internal Medicine

## 2020-09-26 ENCOUNTER — Other Ambulatory Visit: Payer: Self-pay | Admitting: Internal Medicine

## 2020-10-01 ENCOUNTER — Other Ambulatory Visit: Payer: Self-pay | Admitting: Internal Medicine

## 2020-10-31 ENCOUNTER — Encounter (HOSPITAL_COMMUNITY): Payer: Self-pay | Admitting: Emergency Medicine

## 2020-10-31 ENCOUNTER — Emergency Department (HOSPITAL_COMMUNITY)
Admission: EM | Admit: 2020-10-31 | Discharge: 2020-10-31 | Disposition: A | Discharge status: Home / Self Care | Payer: BC Managed Care – PPO | Attending: Emergency Medicine | Admitting: Emergency Medicine

## 2020-10-31 ENCOUNTER — Other Ambulatory Visit: Payer: Self-pay

## 2020-10-31 ENCOUNTER — Emergency Department (HOSPITAL_COMMUNITY): Payer: BC Managed Care – PPO

## 2020-10-31 DIAGNOSIS — I10 Essential (primary) hypertension: Secondary | ICD-10-CM | POA: Insufficient documentation

## 2020-10-31 DIAGNOSIS — Z79899 Other long term (current) drug therapy: Secondary | ICD-10-CM | POA: Insufficient documentation

## 2020-10-31 DIAGNOSIS — R110 Nausea: Secondary | ICD-10-CM | POA: Diagnosis not present

## 2020-10-31 DIAGNOSIS — R1032 Left lower quadrant pain: Secondary | ICD-10-CM | POA: Diagnosis present

## 2020-10-31 DIAGNOSIS — R6883 Chills (without fever): Secondary | ICD-10-CM | POA: Diagnosis not present

## 2020-10-31 DIAGNOSIS — R109 Unspecified abdominal pain: Secondary | ICD-10-CM

## 2020-10-31 DIAGNOSIS — R000 Tachycardia, unspecified: Secondary | ICD-10-CM | POA: Diagnosis not present

## 2020-10-31 DIAGNOSIS — E119 Type 2 diabetes mellitus without complications: Secondary | ICD-10-CM | POA: Diagnosis not present

## 2020-10-31 LAB — URINALYSIS, ROUTINE W REFLEX MICROSCOPIC
Bilirubin Urine: NEGATIVE
Glucose, UA: NEGATIVE mg/dL
Hgb urine dipstick: NEGATIVE
Ketones, ur: NEGATIVE mg/dL
Leukocytes,Ua: NEGATIVE
Nitrite: POSITIVE — AB
Protein, ur: NEGATIVE mg/dL
Specific Gravity, Urine: 1.02 (ref 1.005–1.030)
pH: 6 (ref 5.0–8.0)

## 2020-10-31 LAB — HEPATIC FUNCTION PANEL
ALT: 18 U/L (ref 0–44)
AST: 19 U/L (ref 15–41)
Albumin: 4.1 g/dL (ref 3.5–5.0)
Alkaline Phosphatase: 87 U/L (ref 38–126)
Bilirubin, Direct: <0.1 mg/dL (ref 0.0–0.2)
Total Bilirubin: 0.4 mg/dL (ref 0.3–1.2)
Total Protein: 8.6 g/dL — ABNORMAL HIGH (ref 6.5–8.1)

## 2020-10-31 LAB — BASIC METABOLIC PANEL
Anion gap: 7 (ref 5–15)
BUN: 14 mg/dL (ref 6–20)
CO2: 30 mmol/L (ref 22–32)
Calcium: 9.2 mg/dL (ref 8.9–10.3)
Chloride: 101 mmol/L (ref 98–111)
Creatinine, Ser: 0.91 mg/dL (ref 0.44–1.00)
GFR, Estimated: >60 mL/min (ref >60)
Glucose, Bld: 115 mg/dL — ABNORMAL HIGH (ref 70–99)
Potassium: 3.2 mmol/L — ABNORMAL LOW (ref 3.5–5.1)
Sodium: 138 mmol/L (ref 135–145)

## 2020-10-31 LAB — CBC
HCT: 42.9 % (ref 36.0–46.0)
Hemoglobin: 14.2 g/dL (ref 12.0–15.0)
MCH: 29.6 pg (ref 26.0–34.0)
MCHC: 33.1 g/dL (ref 30.0–36.0)
MCV: 89.6 fL (ref 80.0–100.0)
Platelets: 270 10*3/uL (ref 150–400)
RBC: 4.79 MIL/uL (ref 3.87–5.11)
RDW: 12.4 % (ref 11.5–15.5)
WBC: 16.9 10*3/uL — ABNORMAL HIGH (ref 4.0–10.5)
nRBC: 0 % (ref 0.0–0.2)

## 2020-10-31 LAB — LIPASE, BLOOD: Lipase: 28 U/L (ref 11–51)

## 2020-10-31 LAB — LACTIC ACID, PLASMA: Lactic Acid, Venous: 1.5 mmol/L (ref 0.5–1.9)

## 2020-10-31 MED ORDER — KETOROLAC TROMETHAMINE 30 MG/ML IJ SOLN
30.0000 mg | Freq: Once | INTRAMUSCULAR | Status: AC
  Administered 2020-10-31: 30 mg via INTRAVENOUS
  Filled 2020-10-31: qty 1

## 2020-10-31 MED ORDER — ACETAMINOPHEN 500 MG PO TABS
1000.0000 mg | ORAL_TABLET | Freq: Once | ORAL | Status: AC
  Administered 2020-10-31: 1000 mg via ORAL
  Filled 2020-10-31: qty 2

## 2020-10-31 MED ORDER — SODIUM CHLORIDE 0.9 % IV BOLUS
1000.0000 mL | Freq: Once | INTRAVENOUS | Status: AC
  Administered 2020-10-31: 1000 mL via INTRAVENOUS

## 2020-10-31 MED ORDER — ONDANSETRON HCL 4 MG/2ML IJ SOLN
4.0000 mg | Freq: Once | INTRAMUSCULAR | Status: AC
  Administered 2020-10-31: 4 mg via INTRAVENOUS
  Filled 2020-10-31: qty 2

## 2020-10-31 MED ORDER — SODIUM CHLORIDE 0.9 % IV SOLN
1.0000 g | Freq: Once | INTRAVENOUS | Status: AC
  Administered 2020-10-31: 1 g via INTRAVENOUS
  Filled 2020-10-31: qty 10

## 2020-10-31 MED ORDER — ONDANSETRON 4 MG PO TBDP: 4.0000 mg | ORAL_TABLET | Freq: Three times a day (TID) | ORAL | 0 refills | Status: DC | PRN

## 2020-10-31 MED ORDER — CEFPODOXIME PROXETIL 100 MG PO TABS: 200.0000 mg | ORAL_TABLET | Freq: Two times a day (BID) | ORAL | 0 refills | Status: AC

## 2020-10-31 NOTE — ED Triage Notes (Signed)
Pt to the ED with left flank pain, chills, and vomiting. Recent history of UTI and treatment.

## 2020-10-31 NOTE — ED Provider Notes (Signed)
Emergency Medicine Provider Triage Evaluation Note  Sheryl Lowe , a 60 y.o. female  was evaluated in triage.  Pt complains of lower abdominal and back pain.  Patient states that she was diagnosed with a UTI in early July and completed a course of Macrobid.  Earlier this week she had similar symptoms including lower abdominal pain as well as decreased appetite, nausea, vomiting, chills.  She states she saw her PCP and had an injection of antibiotics and was discharged on another course of Macrobid as well as Pyridium.  She states he has been compliant with these medications but her symptoms have persisted.  For the past 24 hours she has experienced worsening lower abdominal pain, left flank pain, chills, nausea, vomiting.  She states she is not tolerating any p.o. intake.  Denies any dysuria but does note urinary frequency.  Of note, patient states she has a significant history of kidney stones.  Physical Exam  BP (!) 135/112 (BP Location: Right Arm)   Pulse (!) 139   Temp 99.8 F (37.7 C) (Oral)   Resp 17   Ht 5\' 1"  (1.549 m)   Wt 68.4 kg   SpO2 97%   BMI 28.49 kg/m  Gen:   Awake, no distress   Resp:  Normal effort  MSK:   Moves extremities without difficulty  Other:    Medical Decision Making  Medically screening exam initiated at 6:29 PM.  Appropriate orders placed.  was informed that the remainder of the evaluation will be completed by another provider, this initial triage assessment does not replace that evaluation, and the importance of remaining in the ED until their evaluation is complete.   Quenten Raven, PA-C 10/31/20 1831    11/02/20, MD 11/01/20 1028

## 2020-10-31 NOTE — ED Provider Notes (Signed)
[facility] EMERGENCY DEPARTMENT Provider Note   CSN: [id number] Arrival date & time: 10/31/20  1454     History Chief Complaint  Patient presents with   Flank Pain    Sheryl Lowe is a 60 y.o. female.  HPI   Pt is a 60 y/o female with a h/o anxiety, HLD, HTN, nephrolithiasis, migraines who presents to the ED today for eval of flank pain and urinary symptoms that started 1 month ago. She was started on macrobid and pyridium at that time. Sxs recurred 2 weeks ago and she started having abd pain, left flank pain, chills, and nausea. Denies that she had dysuria at that time. She was seen by her pcp on 10/27/20 and was given a shot of abx. She was started on macrobid again but symptoms have persisted.   Past Medical History:  Diagnosis Date   Anxiety    Hyperlipidemia    Hypertension    Kidney stones    Migraine     Patient Active Problem List   Diagnosis Date Noted   GAD (generalized anxiety disorder) 02/01/2018   Diabetes mellitus without complication (HCC) 03/05/2017   Overweight 05/03/2016   Chronic headaches 03/27/2015   GERD (gastroesophageal reflux disease) 03/27/2015   Hyperlipidemia 03/26/2015   Essential hypertension, benign 03/26/2015   Migraine 03/26/2015    Past Surgical History:  Procedure Laterality Date   CHOLECYSTECTOMY  2020     OB History   No obstetric history on file.     Family History  Problem Relation Age of Onset   Stroke Mother    Heart disease Mother    Hypertension Father    Cancer Father     Social History   Tobacco Use   Smoking status: Never   Smokeless tobacco: Never  Vaping Use   Vaping Use: Never used  Substance Use Topics   Alcohol use: Yes   Drug use: No    Home Medications Prior to Admission medications   Medication Sig Start Date End Date Taking? Authorizing Provider  atorvastatin (LIPITOR) 10 MG tablet TAKE 1 TABLET BY MOUTH EVERY DAY 10/01/20  Yes Burns, Bobette Mo, MD  cefpodoxime (VANTIN) 100 MG tablet Take 2  tablets (200 mg total) by mouth 2 (two) times daily for 14 days. 10/31/20 11/14/20 Yes Bayan Hedstrom S, PA-C  citalopram (CELEXA) 10 MG tablet TAKE 1 TABLET BY MOUTH EVERY DAY 02/20/20  Yes Burns, Bobette Mo, MD  fluticasone (FLONASE) 50 MCG/ACT nasal spray Place 2 sprays into both nostrils daily. 02/12/20  Yes McVey, Madelaine Bhat, PA-C  omeprazole (PRILOSEC) 20 MG capsule Take 1 capsule (20 mg total) by mouth daily. 03/26/15  Yes Burns, Bobette Mo, MD  ondansetron (ZOFRAN ODT) 4 MG disintegrating tablet Take 1 tablet (4 mg total) by mouth every 8 (eight) hours as needed for nausea or vomiting. 10/31/20  Yes Keiyon Plack S, PA-C  rizatriptan (MAXALT) 10 MG tablet TAKE 1 TABLET BY MOUTH AS NEEDED FOR MIGRAINE**MAY REPEAT IN 2 HOURS IF NEEDED** 09/27/20  Yes Burns, Bobette Mo, MD  valsartan-hydrochlorothiazide (DIOVAN-HCT) 80-12.5 MG tablet Take 1 tablet by mouth daily. 08/16/20  Yes [provider]  benzonatate (TESSALON) 100 MG capsule Take 1-2 capsules (100-200 mg total) by mouth 3 (three) times daily as needed for cough. Patient not taking: Reported on 10/31/2020 02/12/20   McVey, Madelaine Bhat, PA-C  candesartan-hydrochlorothiazide (ATACAND HCT) 32-12.5 MG tablet TAKE 1 TABLET BY MOUTH EVERY DAY Patient not taking: No sig reported 08/13/20   Pincus Sanes,  MD    Allergies    Amitriptyline and Topamax [topiramate]  Review of Systems   Review of Systems  Constitutional:  Positive for chills. Negative for fever.  HENT:  Negative for ear pain and sore throat.   Eyes:  Negative for visual disturbance.  Respiratory:  Negative for cough and shortness of breath.   Cardiovascular:  Negative for chest pain.  Gastrointestinal:  Positive for abdominal pain, diarrhea, nausea and vomiting.  Genitourinary:  Positive for flank pain. Negative for dysuria, hematuria and urgency.  Musculoskeletal:  Negative for back pain.  Skin:  Negative for color change and rash.  Neurological:  Negative for  headaches.  All other systems reviewed and are negative.  Physical Exam Updated Vital Signs BP 132/74   Pulse 100   Temp 99.3 F (37.4 C) (Oral)   Resp 18   Ht 5\' 1"  (1.549 m)   Wt 68.4 kg   SpO2 94%   BMI 28.49 kg/m   Physical Exam Vitals and nursing note reviewed.  Constitutional:      General: She is not in acute distress.    Appearance: She is well-developed.  HENT:     Head: Normocephalic and atraumatic.  Eyes:     Conjunctiva/sclera: Conjunctivae normal.  Cardiovascular:     Rate and Rhythm: Regular rhythm. Tachycardia present.     Heart sounds: No murmur heard. Pulmonary:     Effort: Pulmonary effort is normal. No respiratory distress.     Breath sounds: Normal breath sounds.  Abdominal:     Palpations: Abdomen is soft.     Tenderness: There is no abdominal tenderness. There is no right CVA tenderness, left CVA tenderness, guarding or rebound.  Musculoskeletal:     Cervical back: Neck supple.  Skin:    General: Skin is warm and dry.  Neurological:     Mental Status: She is alert.    ED Results / Procedures / Treatments   Labs (all labs ordered are listed, but only abnormal results are displayed) Labs Reviewed  BASIC METABOLIC PANEL - Abnormal; Notable for the following components:      Result Value   Potassium 3.2 (*)    Glucose, Bld 115 (*)    All other components within normal limits  CBC - Abnormal; Notable for the following components:   WBC 16.9 (*)    All other components within normal limits  URINALYSIS, ROUTINE W REFLEX MICROSCOPIC - Abnormal; Notable for the following components:   Color, Urine AMBER (*)    Nitrite POSITIVE (*)    Bacteria, UA RARE (*)    All other components within normal limits  HEPATIC FUNCTION PANEL - Abnormal; Notable for the following components:   Total Protein 8.6 (*)    All other components within normal limits  CULTURE, BLOOD (ROUTINE X 2)  CULTURE, BLOOD (ROUTINE X 2)  URINE CULTURE  LIPASE, BLOOD  LACTIC  ACID, PLASMA  LACTIC ACID, PLASMA    EKG None  Radiology CT Renal Stone Study  Result Date: 10/31/2020 CLINICAL DATA:  Left lung pain.  Concern for kidney stone. EXAM: CT ABDOMEN AND PELVIS WITHOUT CONTRAST TECHNIQUE: Multidetector CT imaging of the abdomen and pelvis was performed following the standard protocol without IV contrast. COMPARISON:  CT abdomen pelvis report dated 10/11/2018. The images are not available for direct comparison. FINDINGS: Evaluation of this exam is limited in the absence of intravenous contrast. Lower chest: The visualized lung bases are clear. No intra-abdominal free air or free fluid. Hepatobiliary:  Fatty liver. No intrahepatic biliary ductal dilatation. Cholecystectomy. No retained calcified stone noted in the central CBD. Pancreas: Unremarkable. No pancreatic ductal dilatation or surrounding inflammatory changes. Spleen: Normal in size without focal abnormality. Adrenals/Urinary Tract: The adrenal glands are unremarkable. The kidneys, visualized ureters, and urinary bladder appear unremarkable Stomach/Bowel: There is sigmoid diverticulosis. Mild perisigmoid haziness, likely chronic. There is mild thickened appearance of the distal colon likely related to underdistention. Mild colitis is less likely. Clinical correlation is recommended. There is no bowel obstruction. The appendix is normal. Vascular/Lymphatic: Mild aortoiliac atherosclerotic disease. The IVC is unremarkable. The pain venous gas. There is no adenopathy. Reproductive: The uterus is anteverted and grossly unremarkable. No adnexal masses. Other: None Musculoskeletal: No acute or significant osseous findings. IMPRESSION: 1. No acute intra-abdominal or pelvic pathology. No hydronephrosis or nephrolithiasis. 2. Sigmoid diverticulosis. Underdistention versus less likely mild colitis of the distal colon. Clinical correlation is recommended. No bowel obstruction. Normal appendix. 3. Fatty liver. 4. Aortic  Atherosclerosis (ICD10-I70.0). Electronically Signed   By: Elgie Collard M.D.   On: 10/31/2020 19:56    Procedures Procedures   Medications Ordered in ED Medications  ketorolac (TORADOL) 30 MG/ML injection 30 mg (30 mg Intravenous Given 10/31/20 1933)  ondansetron (ZOFRAN) injection 4 mg (4 mg Intravenous Given 10/31/20 1933)  sodium chloride 0.9 % bolus 1,000 mL (0 mLs Intravenous Stopped 10/31/20 2026)  acetaminophen (TYLENOL) tablet 1,000 mg (1,000 mg Oral Given 10/31/20 1933)  cefTRIAXone (ROCEPHIN) 1 g in sodium chloride 0.9 % 100 mL IVPB (0 g Intravenous Stopped 10/31/20 2043)    ED Course  I have reviewed the triage vital signs and the nursing notes.  Pertinent labs & imaging results that were available during my care of the patient were reviewed by me and considered in my medical decision making (see chart for details).    MDM Rules/Calculators/A&P                          59 year old female presenting for evaluation of lower abdominal pain, left flank pain.  Previously treated for UTIs but symptoms have persisted.  Has now developed nausea and vomiting  Found to be febrile and tachycardic on arrival.  Lactic acid and blood cultures were obtained  Reviewed/interpreted lab CBC with leukocytosis, no anemia BMP with mild hypokalemia, otherwise reassuring LFTs are normal Lipase is negative UA shows nitrites and rare bacteria.  Culture obtained Lactic acid negative Blood cultures obtained  CT abdomen/pelvis reviewed/interpreted -  1. No acute intra-abdominal or pelvic pathology. No hydronephrosis or nephrolithiasis. 2. Sigmoid diverticulosis. Underdistention versus less likely mild colitis of the distal colon. Clinical correlation is recommended. No bowel obstruction. Normal appendix. 3. Fatty liver. 4. Aortic Atherosclerosis  Patient was given IV fluids, antiemetics and Toradol.  On repeat assessment she states she feels much improved and has been able to tolerate p.o.  I  suspect that she likely has pyelonephritis.  Urine is difficult to interpret in the setting of currently being on Macrobid however given her symptoms and fevers we will go ahead and treat for this.  I have low suspicion for PID or other intra-abdominal/pelvic etiology at this time have advised patient to follow-up with her PCP for reassessment and return to the ED for any new or worsening symptoms.  She voiced understanding the plan and reasons to return.  Questions answered.  Patient stable for discharge.   Final Clinical Impression(s) / ED Diagnoses Final diagnoses:  Flank pain  Rx / DC Orders ED Discharge Orders          Ordered    cefpodoxime (VANTIN) 100 MG tablet  2 times daily        10/31/20 2133    ondansetron (ZOFRAN ODT) 4 MG disintegrating tablet  Every 8 hours PRN        10/31/20 2133             Rayne Du 10/31/20 2136    Terrilee Files, MD 11/01/20 1028

## 2020-10-31 NOTE — Discharge Instructions (Addendum)
You were given a prescription for antibiotics. Please take the antibiotic prescription fully.   A culture was sent of your urine today to determine if there is any bacterial growth. If the results of the culture are positive and you require an antibiotic or a change of your prescribed antibiotic you will be contacted by the hospital. If the results are negative you will not be contacted.  Please follow up with your primary doctor within the next 5-7 days.  If you do not have a primary care provider, information for a healthcare clinic has been provided for you to make arrangements for follow up care. Please return to the ER sooner if you have any new or worsening symptoms, or if you have any of the following symptoms:  Abdominal pain that does not go away.  You have a fever.  You keep throwing up (vomiting).  The pain is felt only in portions of the abdomen. Pain in the right side could possibly be appendicitis. In an adult, pain in the left lower portion of the abdomen could be colitis or diverticulitis.  You pass bloody or black tarry stools.  There is bright red blood in the stool.  The constipation stays for more than 4 days.  There is belly (abdominal) or rectal pain.  You do not seem to be getting better.  You have any questions or concerns.

## 2020-11-02 LAB — URINE CULTURE: Culture: <10000 — AB

## 2020-11-05 ENCOUNTER — Other Ambulatory Visit: Payer: Self-pay | Admitting: Internal Medicine

## 2020-11-05 LAB — CULTURE, BLOOD (ROUTINE X 2)
Culture: NO GROWTH
Culture: NO GROWTH
Special Requests: ADEQUATE
Special Requests: ADEQUATE

## 2020-11-06 ENCOUNTER — Encounter: Payer: Self-pay | Admitting: Internal Medicine

## 2020-11-23 ENCOUNTER — Encounter (HOSPITAL_COMMUNITY): Payer: Self-pay

## 2020-11-23 ENCOUNTER — Inpatient Hospital Stay (HOSPITAL_COMMUNITY)
Admission: EM | Admit: 2020-11-23 | Discharge: 2020-11-25 | DRG: 372 | Disposition: A | Discharge status: Home / Self Care | Payer: BC Managed Care – PPO | Attending: Internal Medicine | Admitting: Internal Medicine

## 2020-11-23 ENCOUNTER — Other Ambulatory Visit: Payer: Self-pay

## 2020-11-23 ENCOUNTER — Emergency Department (HOSPITAL_COMMUNITY): Payer: BC Managed Care – PPO

## 2020-11-23 DIAGNOSIS — E785 Hyperlipidemia, unspecified: Secondary | ICD-10-CM | POA: Diagnosis present

## 2020-11-23 DIAGNOSIS — E119 Type 2 diabetes mellitus without complications: Secondary | ICD-10-CM | POA: Diagnosis present

## 2020-11-23 DIAGNOSIS — Z8619 Personal history of other infectious and parasitic diseases: Secondary | ICD-10-CM | POA: Diagnosis not present

## 2020-11-23 DIAGNOSIS — Z8249 Family history of ischemic heart disease and other diseases of the circulatory system: Secondary | ICD-10-CM | POA: Diagnosis not present

## 2020-11-23 DIAGNOSIS — R197 Diarrhea, unspecified: Secondary | ICD-10-CM | POA: Diagnosis present

## 2020-11-23 DIAGNOSIS — F419 Anxiety disorder, unspecified: Secondary | ICD-10-CM | POA: Diagnosis present

## 2020-11-23 DIAGNOSIS — E876 Hypokalemia: Secondary | ICD-10-CM | POA: Diagnosis present

## 2020-11-23 DIAGNOSIS — Z9049 Acquired absence of other specified parts of digestive tract: Secondary | ICD-10-CM | POA: Diagnosis not present

## 2020-11-23 DIAGNOSIS — R6510 Systemic inflammatory response syndrome (SIRS) of non-infectious origin without acute organ dysfunction: Secondary | ICD-10-CM | POA: Diagnosis not present

## 2020-11-23 DIAGNOSIS — D61818 Other pancytopenia: Secondary | ICD-10-CM | POA: Diagnosis present

## 2020-11-23 DIAGNOSIS — Z823 Family history of stroke: Secondary | ICD-10-CM | POA: Diagnosis not present

## 2020-11-23 DIAGNOSIS — F411 Generalized anxiety disorder: Secondary | ICD-10-CM | POA: Diagnosis present

## 2020-11-23 DIAGNOSIS — I10 Essential (primary) hypertension: Secondary | ICD-10-CM | POA: Diagnosis present

## 2020-11-23 DIAGNOSIS — A0472 Enterocolitis due to Clostridium difficile, not specified as recurrent: Secondary | ICD-10-CM | POA: Diagnosis present

## 2020-11-23 DIAGNOSIS — R000 Tachycardia, unspecified: Secondary | ICD-10-CM

## 2020-11-23 DIAGNOSIS — Z20822 Contact with and (suspected) exposure to covid-19: Secondary | ICD-10-CM | POA: Diagnosis present

## 2020-11-23 DIAGNOSIS — K219 Gastro-esophageal reflux disease without esophagitis: Secondary | ICD-10-CM | POA: Diagnosis present

## 2020-11-23 LAB — URINALYSIS, ROUTINE W REFLEX MICROSCOPIC
Glucose, UA: NEGATIVE mg/dL
Ketones, ur: 15 mg/dL — AB
Nitrite: NEGATIVE
Specific Gravity, Urine: 1.025 (ref 1.005–1.030)
pH: 5.5 (ref 5.0–8.0)

## 2020-11-23 LAB — CBC WITH DIFFERENTIAL/PLATELET
Abs Immature Granulocytes: 0.04 10*3/uL (ref 0.00–0.07)
Basophils Absolute: 0.1 10*3/uL (ref 0.0–0.1)
Basophils Relative: 1 %
Eosinophils Absolute: 0.2 10*3/uL (ref 0.0–0.5)
Eosinophils Relative: 1 %
HCT: 38.3 % (ref 36.0–46.0)
Hemoglobin: 12.9 g/dL (ref 12.0–15.0)
Immature Granulocytes: 0 %
Lymphocytes Relative: 9 %
Lymphs Abs: 1.3 10*3/uL (ref 0.7–4.0)
MCH: 29.9 pg (ref 26.0–34.0)
MCHC: 33.7 g/dL (ref 30.0–36.0)
MCV: 88.7 fL (ref 80.0–100.0)
Monocytes Absolute: 1.3 10*3/uL — ABNORMAL HIGH (ref 0.1–1.0)
Monocytes Relative: 9 %
Neutro Abs: 11.8 10*3/uL — ABNORMAL HIGH (ref 1.7–7.7)
Neutrophils Relative %: 80 %
Platelets: 277 10*3/uL (ref 150–400)
RBC: 4.32 MIL/uL (ref 3.87–5.11)
RDW: 13 % (ref 11.5–15.5)
WBC: 14.6 10*3/uL — ABNORMAL HIGH (ref 4.0–10.5)
nRBC: 0 % (ref 0.0–0.2)

## 2020-11-23 LAB — COMPREHENSIVE METABOLIC PANEL
ALT: 19 U/L (ref 0–44)
AST: 26 U/L (ref 15–41)
Albumin: 4 g/dL (ref 3.5–5.0)
Alkaline Phosphatase: 77 U/L (ref 38–126)
Anion gap: 10 (ref 5–15)
BUN: 18 mg/dL (ref 6–20)
CO2: 23 mmol/L (ref 22–32)
Calcium: 8.9 mg/dL (ref 8.9–10.3)
Chloride: 103 mmol/L (ref 98–111)
Creatinine, Ser: 1.04 mg/dL — ABNORMAL HIGH (ref 0.44–1.00)
GFR, Estimated: >60 mL/min (ref >60)
Glucose, Bld: 137 mg/dL — ABNORMAL HIGH (ref 70–99)
Potassium: 3.2 mmol/L — ABNORMAL LOW (ref 3.5–5.1)
Sodium: 136 mmol/L (ref 135–145)
Total Bilirubin: 0.6 mg/dL (ref 0.3–1.2)
Total Protein: 8.4 g/dL — ABNORMAL HIGH (ref 6.5–8.1)

## 2020-11-23 LAB — GLUCOSE, CAPILLARY: Glucose-Capillary: 81 mg/dL (ref 70–99)

## 2020-11-23 LAB — HEMOGLOBIN A1C
Hgb A1c MFr Bld: 6.3 % — ABNORMAL HIGH (ref 4.8–5.6)
Mean Plasma Glucose: 134.11 mg/dL

## 2020-11-23 LAB — RESP PANEL BY RT-PCR (FLU A&B, COVID) ARPGX2
Influenza A by PCR: NEGATIVE
Influenza B by PCR: NEGATIVE
SARS Coronavirus 2 by RT PCR: NEGATIVE

## 2020-11-23 LAB — C DIFFICILE QUICK SCREEN W PCR REFLEX
C Diff antigen: POSITIVE — AB
C Diff interpretation: DETECTED
C Diff toxin: POSITIVE — AB

## 2020-11-23 LAB — MAGNESIUM: Magnesium: 1.8 mg/dL (ref 1.7–2.4)

## 2020-11-23 LAB — URINALYSIS, MICROSCOPIC (REFLEX): Bacteria, UA: NONE SEEN

## 2020-11-23 LAB — LACTIC ACID, PLASMA: Lactic Acid, Venous: 1.8 mmol/L (ref 0.5–1.9)

## 2020-11-23 MED ORDER — POTASSIUM CHLORIDE IN NACL 40-0.9 MEQ/L-% IV SOLN: INTRAVENOUS | Status: AC

## 2020-11-23 MED ORDER — IRBESARTAN 150 MG PO TABS
75.0000 mg | ORAL_TABLET | Freq: Every day | ORAL | Status: DC
  Administered 2020-11-24 – 2020-11-25 (×2): 75 mg via ORAL
  Filled 2020-11-23 (×2): qty 1

## 2020-11-23 MED ORDER — LACTATED RINGERS IV BOLUS
1000.0000 mL | Freq: Once | INTRAVENOUS | Status: AC
  Administered 2020-11-23: 1000 mL via INTRAVENOUS

## 2020-11-23 MED ORDER — VANCOMYCIN HCL 125 MG PO CAPS
125.0000 mg | ORAL_CAPSULE | Freq: Four times a day (QID) | ORAL | Status: DC
  Administered 2020-11-23 – 2020-11-25 (×6): 125 mg via ORAL
  Filled 2020-11-23 (×12): qty 1
  Filled 2020-11-23: qty 4
  Filled 2020-11-23: qty 1

## 2020-11-23 MED ORDER — SODIUM CHLORIDE 0.9 % IV SOLN: INTRAVENOUS | Status: DC

## 2020-11-23 MED ORDER — ACETAMINOPHEN 325 MG PO TABS: 650.0000 mg | ORAL_TABLET | Freq: Four times a day (QID) | ORAL | Status: DC | PRN

## 2020-11-23 MED ORDER — ENOXAPARIN SODIUM 40 MG/0.4ML IJ SOSY
40.0000 mg | PREFILLED_SYRINGE | INTRAMUSCULAR | Status: DC
  Administered 2020-11-23 – 2020-11-24 (×2): 40 mg via SUBCUTANEOUS
  Filled 2020-11-23 (×2): qty 0.4

## 2020-11-23 MED ORDER — FENTANYL CITRATE PF 50 MCG/ML IJ SOSY
50.0000 ug | PREFILLED_SYRINGE | Freq: Once | INTRAMUSCULAR | Status: AC
Start: 2020-11-23 — End: 2020-11-23
  Administered 2020-11-23: 50 ug via INTRAVENOUS
  Filled 2020-11-23: qty 1

## 2020-11-23 MED ORDER — ONDANSETRON HCL 4 MG/2ML IJ SOLN: 4.0000 mg | Freq: Four times a day (QID) | INTRAMUSCULAR | Status: DC | PRN

## 2020-11-23 MED ORDER — SODIUM CHLORIDE 0.9 % IV BOLUS
500.0000 mL | Freq: Once | INTRAVENOUS | Status: AC
  Administered 2020-11-23: 500 mL via INTRAVENOUS

## 2020-11-23 MED ORDER — ONDANSETRON HCL 4 MG/2ML IJ SOLN
4.0000 mg | Freq: Once | INTRAMUSCULAR | Status: AC
  Administered 2020-11-23: 4 mg via INTRAVENOUS
  Filled 2020-11-23: qty 2

## 2020-11-23 MED ORDER — HYDROCODONE-ACETAMINOPHEN 5-325 MG PO TABS
1.0000 | ORAL_TABLET | Freq: Once | ORAL | Status: AC
  Administered 2020-11-23: 1 via ORAL
  Filled 2020-11-23: qty 1

## 2020-11-23 MED ORDER — ACETAMINOPHEN 650 MG RE SUPP: 650.0000 mg | Freq: Four times a day (QID) | RECTAL | Status: DC | PRN

## 2020-11-23 MED ORDER — IOHEXOL 350 MG/ML SOLN
80.0000 mL | Freq: Once | INTRAVENOUS | Status: AC | PRN
  Administered 2020-11-23: 80 mL via INTRAVENOUS

## 2020-11-23 MED ORDER — ONDANSETRON HCL 4 MG PO TABS: 4.0000 mg | ORAL_TABLET | Freq: Four times a day (QID) | ORAL | Status: DC | PRN

## 2020-11-23 MED ORDER — POTASSIUM CHLORIDE 10 MEQ/100ML IV SOLN
10.0000 meq | INTRAVENOUS | Status: AC
  Administered 2020-11-23 (×3): 10 meq via INTRAVENOUS
  Filled 2020-11-23 (×3): qty 100

## 2020-11-23 MED ORDER — INSULIN ASPART 100 UNIT/ML IJ SOLN: 0.0000 [IU] | Freq: Three times a day (TID) | INTRAMUSCULAR | Status: DC

## 2020-11-23 NOTE — ED Provider Notes (Signed)
[facility] EMERGENCY DEPARTMENT Provider Note   CSN: [id number] Arrival date & time: 11/23/20  6269     History Chief Complaint  Patient presents with   Diarrhea    Sheryl Lowe is a 60 y.o. female.  HPI She presents for evaluation of constellation of symptoms present for 1 month, initially urine more GI.  Comprehensive evaluations have been performed by multiple providers.  She was last treated with oral antibiotics, 10/31/2020, Vantin, for suspected pyelonephritis.  Urine culture at that time showed insignificant growth.  She had imaging negative for acute urinary tract pathology but was suspected to have pyelonephritis.  Yesterday she saw her PCP, who checked her urine and it was "normal."  She is having profuse diarrhea described as "explosive."  She has not seen blood in the diarrhea.  She has history of C. difficile enterocolitis, with similar symptoms.  She has been on antibiotics for several weeks, within the last several months, raising the risk for enterocolitis.  She denies cough, shortness of breath, fever or chills.     Past Medical History:  Diagnosis Date   Anxiety    Hyperlipidemia    Hypertension    Kidney stones    Migraine     Patient Active Problem List   Diagnosis Date Noted   Clostridium difficile colitis 11/23/2020   GAD (generalized anxiety disorder) 02/01/2018   Diabetes mellitus without complication (HCC) 03/05/2017   Overweight 05/03/2016   Chronic headaches 03/27/2015   GERD (gastroesophageal reflux disease) 03/27/2015   Hyperlipidemia 03/26/2015   Essential hypertension, benign 03/26/2015   Migraine 03/26/2015    Past Surgical History:  Procedure Laterality Date   CHOLECYSTECTOMY  2020     OB History   No obstetric history on file.     Family History  Problem Relation Age of Onset   Stroke Mother    Heart disease Mother    Hypertension Father    Cancer Father     Social History   Tobacco Use   Smoking status: Never    Smokeless tobacco: Never  Vaping Use   Vaping Use: Never used  Substance Use Topics   Alcohol use: Yes   Drug use: No    Home Medications Prior to Admission medications   Medication Sig Start Date End Date Taking? Authorizing Provider  atorvastatin (LIPITOR) 10 MG tablet Take 1 tablet (10 mg total) by mouth daily. Need f/u visit for additional refills 11/06/20  Yes Burns, Bobette Mo, MD  omeprazole (PRILOSEC) 20 MG capsule Take 1 capsule (20 mg total) by mouth daily. 03/26/15  Yes Burns, Bobette Mo, MD  rizatriptan (MAXALT) 10 MG tablet TAKE 1 TABLET BY MOUTH AS NEEDED FOR MIGRAINE**MAY REPEAT IN 2 HOURS IF NEEDED** 09/27/20  Yes Burns, Bobette Mo, MD  valsartan-hydrochlorothiazide (DIOVAN-HCT) 80-12.5 MG tablet Take 1 tablet by mouth daily. 08/16/20  Yes [provider]  benzonatate (TESSALON) 100 MG capsule Take 1-2 capsules (100-200 mg total) by mouth 3 (three) times daily as needed for cough. Patient not taking: No sig reported 02/12/20   McVey, Madelaine Bhat, PA-C  candesartan-hydrochlorothiazide (ATACAND HCT) 32-12.5 MG tablet TAKE 1 TABLET BY MOUTH EVERY DAY Patient not taking: No sig reported 08/13/20   Pincus Sanes, MD  cefpodoxime (VANTIN) 200 MG tablet Take 200 mg by mouth 2 (two) times daily. Patient not taking: Reported on 11/23/2020 11/01/20   [provider]  citalopram (CELEXA) 10 MG tablet TAKE 1 TABLET BY MOUTH EVERY DAY Patient not taking: Reported on 11/23/2020  02/20/20   Pincus Sanes, MD  fluticasone (FLONASE) 50 MCG/ACT nasal spray Place 2 sprays into both nostrils daily. Patient not taking: Reported on 11/23/2020 02/12/20   McVey, Madelaine Bhat, PA-C  ondansetron (ZOFRAN ODT) 4 MG disintegrating tablet Take 1 tablet (4 mg total) by mouth every 8 (eight) hours as needed for nausea or vomiting. Patient not taking: Reported on 11/23/2020 10/31/20   Couture, Cortni S, PA-C  tamsulosin (FLOMAX) 0.4 MG CAPS capsule Take 0.4 mg by mouth daily. Patient not taking:  Reported on 11/23/2020 11/22/20   [provider]    Allergies    Amitriptyline and Topamax [topiramate]  Review of Systems   Review of Systems  All other systems reviewed and are negative.  Physical Exam Updated Vital Signs BP 105/69   Pulse 94   Temp 98.5 F (36.9 C) (Oral)   Resp 15   Ht 5\' 1"  (1.549 m)   Wt 68 kg   SpO2 93%   BMI 28.34 kg/m   Physical Exam Vitals and nursing note reviewed.  Constitutional:      General: She is not in acute distress.    Appearance: She is well-developed. She is not ill-appearing, toxic-appearing or diaphoretic.  HENT:     Head: Normocephalic and atraumatic.     Right Ear: External ear normal.     Left Ear: External ear normal.  Eyes:     Conjunctiva/sclera: Conjunctivae normal.     Pupils: Pupils are equal, round, and reactive to light.  Neck:     Trachea: Phonation normal.  Cardiovascular:     Rate and Rhythm: Tachycardia present.  Pulmonary:     Effort: Pulmonary effort is normal. No respiratory distress.     Breath sounds: No stridor.  Abdominal:     General: There is no distension.  Musculoskeletal:        General: Normal range of motion.     Cervical back: Normal range of motion and neck supple.  Skin:    General: Skin is warm and dry.  Neurological:     Mental Status: She is alert and oriented to person, place, and time.     Cranial Nerves: No cranial nerve deficit.     Sensory: No sensory deficit.     Motor: No abnormal muscle tone.     Coordination: Coordination normal.  Psychiatric:        Mood and Affect: Mood normal.        Behavior: Behavior normal.        Thought Content: Thought content normal.        Judgment: Judgment normal.    ED Results / Procedures / Treatments   Labs (all labs ordered are listed, but only abnormal results are displayed) Labs Reviewed  C DIFFICILE QUICK SCREEN W PCR REFLEX   - Abnormal; Notable for the following components:      Result Value   C Diff antigen POSITIVE  (*)    C Diff toxin POSITIVE (*)    All other components within normal limits  COMPREHENSIVE METABOLIC PANEL - Abnormal; Notable for the following components:   Potassium 3.2 (*)    Glucose, Bld 137 (*)    Creatinine, Ser 1.04 (*)    Total Protein 8.4 (*)    All other components within normal limits  CBC WITH DIFFERENTIAL/PLATELET - Abnormal; Notable for the following components:   WBC 14.6 (*)    Neutro Abs 11.8 (*)    Monocytes Absolute 1.3 (*)  All other components within normal limits  URINALYSIS, ROUTINE W REFLEX MICROSCOPIC - Abnormal; Notable for the following components:   Hgb urine dipstick TRACE (*)    Bilirubin Urine SMALL (*)    Ketones, ur 15 (*)    Protein, ur TRACE (*)    Leukocytes,Ua SMALL (*)    All other components within normal limits  RESP PANEL BY RT-PCR (FLU A&B, COVID) ARPGX2  CULTURE, BLOOD (ROUTINE X 2)  CULTURE, BLOOD (ROUTINE X 2)  URINALYSIS, MICROSCOPIC (REFLEX)  MAGNESIUM  LACTIC ACID, PLASMA  HEMOGLOBIN A1C  HIV ANTIBODY (ROUTINE TESTING W REFLEX)  BASIC METABOLIC PANEL  CBC    EKG EKG Interpretation  Date/Time:  Tuesday November 23 2020 09:30:23 EDT Ventricular Rate:  149 PR Interval:  71 QRS Duration: 91 QT Interval:  375 QTC Calculation: 591 R Axis:   -57 Text Interpretation: Sinus tachycardia Left anterior fascicular block Abnormal R-wave progression, late transition Minimal ST depression, lateral leads Prolonged QT interval No old tracing to compare Confirmed by Mancel Bale [phone]) on 11/23/2020 10:07:52 AM  Radiology CT Abdomen Pelvis W Contrast  Result Date: 11/23/2020 CLINICAL DATA:  Infectious gastroenteritis or colitis EXAM: CT ABDOMEN AND PELVIS WITH CONTRAST TECHNIQUE: Multidetector CT imaging of the abdomen and pelvis was performed using the standard protocol following bolus administration of intravenous contrast. CONTRAST:  110mL OMNIPAQUE IOHEXOL 350 MG/ML SOLN COMPARISON:  10/31/2020 FINDINGS: Lower chest: Scattered  small calcified granulomas within the lung bases. Lung bases are otherwise clear. Heart size is normal. Hepatobiliary: Mildly decreased attenuation of the hepatic parenchyma. No focal hepatic lesion is identified. Prior cholecystectomy. No intrahepatic biliary dilatation. Pancreas: Unremarkable. No pancreatic ductal dilatation or surrounding inflammatory changes. Spleen: Normal in size without focal abnormality. Adrenals/Urinary Tract: Unremarkable adrenal glands. Subcentimeter low-density lesions within the right kidney, too small to definitively characterize but most likely represent cysts. Kidneys appear otherwise unremarkable with symmetric enhancement. No renal stone or hydronephrosis. Urinary bladder appears unremarkable for the degree of distension. Stomach/Bowel: Small hiatal hernia. Stomach otherwise unremarkable. Oral contrast is present within the distal small bowel and colon. No dilated loops of bowel to suggest obstruction. There is mild diffuse wall thickening involving the colon. There are a few scattered left-sided diverticula. Vascular/Lymphatic: Scattered aortoiliac atherosclerotic calcifications without aneurysm. No abdominopelvic lymphadenopathy. Reproductive: Uterus and bilateral adnexa are unremarkable. Other: No free fluid. No abdominopelvic fluid collection. No pneumoperitoneum. No abdominal wall hernia. Musculoskeletal: Degenerative disc disease at L5-S1. No acute osseous abnormality. Soft tissues appear within normal limits. IMPRESSION: 1. Mild diffuse wall thickening involving the colon suggestive of a nonspecific infectious or inflammatory colitis. 2. Mild left-sided colonic diverticulosis without evidence to suggest an acute diverticulitis. 3. Small hiatal hernia. 4. Hepatic steatosis. 5. Aortic atherosclerosis (ICD10-I70.0). Electronically Signed   By: Duanne Guess D.O.   On: 11/23/2020 13:41    Procedures .Critical Care Performed by: Mancel Bale, MD Authorized by: Mancel Bale, MD   Critical care provider statement:    Critical care time (minutes):  35   Critical care start time:  11/23/2020 9:15 AM   Critical care end time:  11/23/2020 3:15 PM   Critical care time was exclusive of:  Separately billable procedures and treating other patients   Critical care was time spent personally by me on the following activities:  Blood draw for specimens, development of treatment plan with patient or surrogate, discussions with consultants, evaluation of patient's response to treatment, examination of patient, obtaining history from patient or surrogate, ordering and performing treatments  and interventions, ordering and review of laboratory studies, pulse oximetry, re-evaluation of patient's condition, review of old charts and ordering and review of radiographic studies   Medications Ordered in ED Medications  vancomycin (VANCOCIN) capsule 125 mg (125 mg Oral Not Given 11/23/20 1914)  irbesartan (AVAPRO) tablet 75 mg (has no administration in time range)  insulin aspart (novoLOG) injection 0-9 Units (has no administration in time range)  enoxaparin (LOVENOX) injection 40 mg (has no administration in time range)  0.9 % NaCl with KCl 40 mEq / L  infusion (has no administration in time range)  acetaminophen (TYLENOL) tablet 650 mg (has no administration in time range)    Or  acetaminophen (TYLENOL) suppository 650 mg (has no administration in time range)  ondansetron (ZOFRAN) tablet 4 mg (has no administration in time range)    Or  ondansetron (ZOFRAN) injection 4 mg (has no administration in time range)  sodium chloride 0.9 % bolus 500 mL (0 mLs Intravenous Stopped 11/23/20 1222)  ondansetron (ZOFRAN) injection 4 mg (4 mg Intravenous Given 11/23/20 1019)  sodium chloride 0.9 % bolus 500 mL (0 mLs Intravenous Stopped 11/23/20 1221)  potassium chloride 10 mEq in 100 mL IVPB (0 mEq Intravenous Stopped 11/23/20 1629)  iohexol (OMNIPAQUE) 350 MG/ML injection 80 mL (80 mLs  Intravenous Contrast Given 11/23/20 1255)  ondansetron (ZOFRAN) injection 4 mg (4 mg Intravenous Given 11/23/20 1228)  fentaNYL (SUBLIMAZE) injection 50 mcg (50 mcg Intravenous Given 11/23/20 1327)  lactated ringers bolus 1,000 mL (0 mLs Intravenous Stopped 11/23/20 1804)  HYDROcodone-acetaminophen (NORCO/VICODIN) 5-325 MG per tablet 1 tablet (1 tablet Oral Given 11/23/20 1632)    ED Course  I have reviewed the triage vital signs and the nursing notes.  Pertinent labs & imaging results that were available during my care of the patient were reviewed by me and considered in my medical decision making (see chart for details).  Clinical Course as of 11/23/20 2034  Tue Nov 23, 2020  0930 Twelve-lead EKG with tachycardia, 149 bpm, without visible flutter waves.  This appears to be sinus tachycardia at this time.  QT is also prolonged.  No comparison EKGs are available. [EW]    Clinical Course User Index [EW] Mancel Bale, MD   MDM Rules/Calculators/A&P                            Patient Vitals for the past 24 hrs:  BP Temp Temp src Pulse Resp SpO2 Height Weight  11/23/20 1935 105/69 -- -- 94 15 93 % -- --  11/23/20 1915 -- -- -- -- 15 94 % -- --  11/23/20 1730 114/68 -- -- -- 18 -- -- --  11/23/20 1715 115/81 -- -- -- 15 -- -- --  11/23/20 1630 110/69 -- -- (!) 113 14 95 % -- --  11/23/20 1600 118/76 -- -- (!) 125 15 94 % -- --  11/23/20 1530 120/73 -- -- (!) 122 15 93 % -- --  11/23/20 1500 116/77 -- -- (!) 134 19 94 % -- --  11/23/20 1430 102/63 -- -- (!) 122 (!) 21 90 % -- --  11/23/20 1400 111/81 -- -- (!) 125 19 -- -- --  11/23/20 1330 125/80 -- -- (!) 129 18 98 % -- --  11/23/20 1323 124/85 -- -- (!) 129 15 97 % -- --  11/23/20 1130 115/87 -- -- (!) 134 (!) 23 96 % -- --  11/23/20 1100 120/78 -- -- Marland Kitchen  117 (!) 21 96 % -- --  11/23/20 1030 125/77 -- -- (!) 128 19 92 % -- --  11/23/20 1008 -- 98.5 F (36.9 C) Oral -- -- -- -- --  11/23/20 1008 132/84 -- -- (!) 149 20 93 % -- --   11/23/20 0930 140/85 -- -- (!) 149 (!) 21 94 % 5\' 1"  (1.549 m) 68 kg  11/23/20 0923 131/87 99.7 F (37.6 C) Oral (!) 155 18 95 % -- --    3:05 PM Reevaluation with update and discussion. After initial assessment and treatment, an updated evaluation reveals she is still uncomfortable due to pruritus diarrhea, currently tachycardic at 131.  Blood pressure is reassuring.11/25/20   Medical Decision Making:  This patient is presenting for evaluation of diarrhea and malaise, which does require a range of treatment options, and is a complaint that involves a high risk of morbidity and mortality. The differential diagnoses include UTI, renal stones, colitis, intestinal disorder including C. difficile enterocolitis. I decided to review old records, and in summary elderly female with prior history of kidney stones, presenting after multiple treatments for UTI with diarrhea.  Marked tachycardia on arrival. She has a history of diabetes, anxiety disorder, and hypertension I obtained additional historical information from husband at bedside.  Clinical Laboratory Tests Ordered, included CBC, Metabolic panel, and Urinalysis.  Stool for C. difficile infection review indicates normal except stool positive for C. difficile antigen and toxin, urinalysis abnormal, potassium low, glucose high, creatinine high, total protein high, white count high, left shift present. Radiologic Tests Ordered, included CT abdomen pelvis with IV and oral contrast ordered.  I independently Visualized: Radiographic images, which show abnormalities consistent with C. difficile enterocolitis  Cardiac Monitor Tracing which shows sinus tachycardia    Critical Interventions-clinical evaluation, laboratory testing, radiography, IV fluids, IV potassium, antiemetic medication, narcotic pain reliever, observation and reassessment  After These Interventions, the Patient was reevaluated and was found with malaise and weakness secondary  to profuse diarrhea from C. difficile enterocolitis.  No clinical evidence for sepsis and reassuring laboratory evaluation.  Patient has persistent tachycardia requiring management in the ED with IV fluids, and stabilization.  Persistent tachycardia requires hospitalization.  CRITICAL CARE-yes Performed by: Mancel Bale  Nursing Notes Reviewed/ Care Coordinated Applicable Imaging Reviewed Interpretation of Laboratory Data incorporated into ED treatment   3:09 PM-Consult complete with hospitalist. Patient case explained and discussed.  She agrees to admit patient for further evaluation and treatment. Call ended at 3:15 PM    Final Clinical Impression(s) / ED Diagnoses Final diagnoses:  Diarrhea, unspecified type  Tachycardia  Clostridium difficile enterocolitis  Hypokalemia    Rx / DC Orders ED Discharge Orders     None        Mancel Bale, MD 11/23/20 2034

## 2020-11-23 NOTE — H&P (Signed)
History and Physical    Cydney Alvarenga BJS:[id number] DOB: 1960/07/30 DOA: 11/23/2020  PCP: Pollyann Savoy, NP   Patient coming from: Home  I have personally briefly reviewed patient's old medical records in [facility]  Chief Complaint: Home  HPI: Sheryl Lowe is a 60 y.o. female with medical history significant for diabetes mellitus, hypertension, anxiety.  Patient presented to the ED with complaints of multiple loose stools.  She reports normally she has occasional loose stools due to the fact that she has had a cholecystectomy, but that on Sunday she suddenly had profuse watery stools.  She reports episodes of vomiting none today.  No abdominal pain.  She reports feeling clammy, and maximum temperature of 99.3 recorded.   She reports over the past several weeks, she has been on different courses of antibiotics for urinary tract infection, she cannot member all the names.  She remembers being on Macrobid twice, and last ED received/28, patient was started on Vantin she completed 5 days of this. She denies pain with urination.  She reports a history of C. difficile colitis ~  20 years ago, then she had been on antibiotics.  ED Course: T-max 99.7.  Tachycardic rate 1 17-155.  Respiratory rate 15-23.  Blood pressure systolic 102-140. Potassium 3.2.  Stable creatinine.  WBC 14.6.  UA shows small leukocytes.  CT abdomen-noninfectious, inflammatory colitis.  C. difficile screen came back positive for antigen and toxin. 1 L bolus given.  Hospitalist to admit.  Review of Systems: As per HPI all other systems reviewed and negative.  Past Medical History:  Diagnosis Date   Anxiety    Hyperlipidemia    Hypertension    Kidney stones    Migraine     Past Surgical History:  Procedure Laterality Date   CHOLECYSTECTOMY  2020     reports that she has never smoked. She has never used smokeless tobacco. She reports current alcohol use. She reports that she does not use drugs.  Allergies   Allergen Reactions   Amitriptyline     Drowsiness in the morning   Topamax [Topiramate]     Memory loss, tingling in hands and feet    Family History  Problem Relation Age of Onset   Stroke Mother    Heart disease Mother    Hypertension Father    Cancer Father     Prior to Admission medications   Medication Sig Start Date End Date Taking? Authorizing Provider  atorvastatin (LIPITOR) 10 MG tablet Take 1 tablet (10 mg total) by mouth daily. Need f/u visit for additional refills 11/06/20  Yes Burns, Bobette Mo, MD  omeprazole (PRILOSEC) 20 MG capsule Take 1 capsule (20 mg total) by mouth daily. 03/26/15  Yes Burns, Bobette Mo, MD  rizatriptan (MAXALT) 10 MG tablet TAKE 1 TABLET BY MOUTH AS NEEDED FOR MIGRAINE**MAY REPEAT IN 2 HOURS IF NEEDED** 09/27/20  Yes Burns, Bobette Mo, MD  valsartan-hydrochlorothiazide (DIOVAN-HCT) 80-12.5 MG tablet Take 1 tablet by mouth daily. 08/16/20  Yes [provider]  benzonatate (TESSALON) 100 MG capsule Take 1-2 capsules (100-200 mg total) by mouth 3 (three) times daily as needed for cough. Patient not taking: No sig reported 02/12/20   McVey, Madelaine Bhat, PA-C  candesartan-hydrochlorothiazide (ATACAND HCT) 32-12.5 MG tablet TAKE 1 TABLET BY MOUTH EVERY DAY Patient not taking: No sig reported 08/13/20   Pincus Sanes, MD  cefpodoxime (VANTIN) 200 MG tablet Take 200 mg by mouth 2 (two) times daily. Patient not taking: Reported on  11/23/2020 11/01/20   [provider]  citalopram (CELEXA) 10 MG tablet TAKE 1 TABLET BY MOUTH EVERY DAY Patient not taking: Reported on 11/23/2020 02/20/20   Pincus Sanes, MD  fluticasone [facility]) 50 MCG/ACT nasal spray Place 2 sprays into both nostrils daily. Patient not taking: Reported on 11/23/2020 02/12/20   McVey, Madelaine Bhat, PA-C  ondansetron (ZOFRAN ODT) 4 MG disintegrating tablet Take 1 tablet (4 mg total) by mouth every 8 (eight) hours as needed for nausea or vomiting. Patient not taking: Reported on  11/23/2020 10/31/20   Couture, Cortni S, PA-C  tamsulosin (FLOMAX) 0.4 MG CAPS capsule Take 0.4 mg by mouth daily. Patient not taking: Reported on 11/23/2020 11/22/20   [provider]    Physical Exam: Vitals:   11/23/20 1330 11/23/20 1400 11/23/20 1430 11/23/20 1500  BP: 125/80 111/81 102/63 116/77  Pulse: (!) 129 (!) 125 (!) 122 (!) 134  Resp: 18 19 (!) 21 19  Temp:      TempSrc:      SpO2: 98%  90% 94%  Weight:      Height:        Constitutional: NAD, calm, comfortable Vitals:   11/23/20 1330 11/23/20 1400 11/23/20 1430 11/23/20 1500  BP: 125/80 111/81 102/63 116/77  Pulse: (!) 129 (!) 125 (!) 122 (!) 134  Resp: 18 19 (!) 21 19  Temp:      TempSrc:      SpO2: 98%  90% 94%  Weight:      Height:       Eyes: Pupils equal lids and conjunctivae normal ENMT: Mucous membranes are moist.   Neck: normal,no masses, no thyromegaly Respiratory: clear to auscultation bilaterally, no wheezing, no crackles. Normal respiratory effort. No accessory muscle use.  Cardiovascular: Tachycardic, regular rate and rhythm, no murmurs / rubs / gallops. No extremity edema. 2+ pedal pulses.   Abdomen: soft, no tenderness, no masses palpated. No hepatosplenomegaly. Bowel sounds positive.  Musculoskeletal: no clubbing / cyanosis. No joint deformity upper and lower extremities. Good ROM, no contractures. Normal muscle tone.  Skin: no rashes, lesions, ulcers. No induration Neurologic: No apparent cranial nerve abnormality, moving extremities spontaneously.  Psychiatric: Normal judgment and insight. Alert and oriented x 3. Normal mood.   Labs on Admission: I have personally reviewed following labs and imaging studies  CBC: Recent Labs  Lab 11/23/20 1004  WBC 14.6*  NEUTROABS 11.8*  HGB 12.9  HCT 38.3  MCV 88.7  PLT 277   Basic Metabolic Panel: Recent Labs  Lab 11/23/20 1004  NA 136  K 3.2*  CL 103  CO2 23  GLUCOSE 137*  BUN 18  CREATININE 1.04*  CALCIUM 8.9   GFR: Estimated  Creatinine Clearance: 51.4 mL/min (A) (by C-G formula based on SCr of 1.04 mg/dL (H)). Liver Function Tests: Recent Labs  Lab 11/23/20 1004  AST 26  ALT 19  ALKPHOS 77  BILITOT 0.6  PROT 8.4*  ALBUMIN 4.0   Urine analysis:    Component Value Date/Time   COLORURINE YELLOW 11/23/2020 1004   APPEARANCEUR CLEAR 11/23/2020 1004   LABSPEC 1.025 11/23/2020 1004   PHURINE 5.5 11/23/2020 1004   GLUCOSEU NEGATIVE 11/23/2020 1004   GLUCOSEU NEGATIVE 05/03/2016 1424   HGBUR TRACE (A) 11/23/2020 1004   BILIRUBINUR SMALL (A) 11/23/2020 1004   KETONESUR 15 (A) 11/23/2020 1004   PROTEINUR TRACE (A) 11/23/2020 1004   UROBILINOGEN 0.2 05/03/2016 1424   NITRITE NEGATIVE 11/23/2020 1004   LEUKOCYTESUR SMALL (A) 11/23/2020 1004  Radiological Exams on Admission: CT Abdomen Pelvis W Contrast  Result Date: 11/23/2020 CLINICAL DATA:  Infectious gastroenteritis or colitis EXAM: CT ABDOMEN AND PELVIS WITH CONTRAST TECHNIQUE: Multidetector CT imaging of the abdomen and pelvis was performed using the standard protocol following bolus administration of intravenous contrast. CONTRAST:  61mL OMNIPAQUE IOHEXOL 350 MG/ML SOLN COMPARISON:  10/31/2020 FINDINGS: Lower chest: Scattered small calcified granulomas within the lung bases. Lung bases are otherwise clear. Heart size is normal. Hepatobiliary: Mildly decreased attenuation of the hepatic parenchyma. No focal hepatic lesion is identified. Prior cholecystectomy. No intrahepatic biliary dilatation. Pancreas: Unremarkable. No pancreatic ductal dilatation or surrounding inflammatory changes. Spleen: Normal in size without focal abnormality. Adrenals/Urinary Tract: Unremarkable adrenal glands. Subcentimeter low-density lesions within the right kidney, too small to definitively characterize but most likely represent cysts. Kidneys appear otherwise unremarkable with symmetric enhancement. No renal stone or hydronephrosis. Urinary bladder appears unremarkable for the  degree of distension. Stomach/Bowel: Small hiatal hernia. Stomach otherwise unremarkable. Oral contrast is present within the distal small bowel and colon. No dilated loops of bowel to suggest obstruction. There is mild diffuse wall thickening involving the colon. There are a few scattered left-sided diverticula. Vascular/Lymphatic: Scattered aortoiliac atherosclerotic calcifications without aneurysm. No abdominopelvic lymphadenopathy. Reproductive: Uterus and bilateral adnexa are unremarkable. Other: No free fluid. No abdominopelvic fluid collection. No pneumoperitoneum. No abdominal wall hernia. Musculoskeletal: Degenerative disc disease at L5-S1. No acute osseous abnormality. Soft tissues appear within normal limits. IMPRESSION: 1. Mild diffuse wall thickening involving the colon suggestive of a nonspecific infectious or inflammatory colitis. 2. Mild left-sided colonic diverticulosis without evidence to suggest an acute diverticulitis. 3. Small hiatal hernia. 4. Hepatic steatosis. 5. Aortic atherosclerosis (ICD10-I70.0). Electronically Signed   By: Duanne Guess D.O.   On: 11/23/2020 13:41    EKG: Independently reviewed.  Sinus tachycardia rate 149, QTc prolonged 591.  LAFB.  No prior EKG to compare.  Assessment/Plan Active Problems:   Clostridium difficile colitis   Nonsevere C. difficile colitis + SIRs-meeting SIRS criteria with tachycardia up to 155, leukocytosis of 14.6.  Normal lactic acid T-max 99.7.  CT abdomen and pelvis shows mild diffuse wall thickening suggestive of nonspecific infectious or inflammatory colitis.  C. difficile stool antigen and toxin positive.  History of C. difficile colitis, but this was about 20 years ago. -2 L bolus given, normal saline +40 KCl @ 100 cc/h  X  20 hours -Check lactic acid -Obtain blood culture -Vancomycin 125 mg 4 times daily X 10 day -Phenergan as needed -Probiotics -Bowel rest with clear liquid diet  Hypokalemia-potassium 3.2.  Normal magnesium  1.8 - Replete K  Prolonged QTC-591.  Potassium magnesium within normal limits. -Limits QT prolonging medications, hold home Celexa for now  Diabetes mellitus-random glucose 137 - SSI- S  Hypertension-soft to stable blood pressure. - resume home valsartan in a.m, hold HCTZ to allow for hydration.  Anxiety  -Hold Celexa with markedly prolonged QTC.  DVT prophylaxis: Lovenox Code Status: Full code Family Communication: Spouse at bedside Disposition Plan: ~ 2 days Consults called: None Admission status: Inpt, tele  I certify that at the point of admission it is my clinical judgment that the patient will require inpatient hospital care spanning beyond 2 midnights from the point of admission due to high intensity of service, high risk for further deterioration and high frequency of surveillance required.   Onnie Boer MD [facility]  11/23/2020, 4:48 PM

## 2020-11-23 NOTE — ED Triage Notes (Signed)
Pt presents to ED with complaints of diarrhea, vomiting, and fever up to 99 since Sunday. Pt has recently been on abx for UTI.

## 2020-11-24 ENCOUNTER — Other Ambulatory Visit (HOSPITAL_COMMUNITY): Payer: Self-pay

## 2020-11-24 LAB — BASIC METABOLIC PANEL
Anion gap: 5 (ref 5–15)
BUN: 9 mg/dL (ref 6–20)
CO2: 23 mmol/L (ref 22–32)
Calcium: 8.3 mg/dL — ABNORMAL LOW (ref 8.9–10.3)
Chloride: 112 mmol/L — ABNORMAL HIGH (ref 98–111)
Creatinine, Ser: 0.88 mg/dL (ref 0.44–1.00)
GFR, Estimated: >60 mL/min (ref >60)
Glucose, Bld: 88 mg/dL (ref 70–99)
Potassium: 4.1 mmol/L (ref 3.5–5.1)
Sodium: 140 mmol/L (ref 135–145)

## 2020-11-24 LAB — HIV ANTIBODY (ROUTINE TESTING W REFLEX): HIV Screen 4th Generation wRfx: NONREACTIVE

## 2020-11-24 LAB — CBC
HCT: 31 % — ABNORMAL LOW (ref 36.0–46.0)
Hemoglobin: 9.7 g/dL — ABNORMAL LOW (ref 12.0–15.0)
MCH: 29 pg (ref 26.0–34.0)
MCHC: 31.3 g/dL (ref 30.0–36.0)
MCV: 92.8 fL (ref 80.0–100.0)
Platelets: 147 10*3/uL — ABNORMAL LOW (ref 150–400)
RBC: 3.34 MIL/uL — ABNORMAL LOW (ref 3.87–5.11)
RDW: 13.2 % (ref 11.5–15.5)
WBC: 3.4 10*3/uL — ABNORMAL LOW (ref 4.0–10.5)
nRBC: 0 % (ref 0.0–0.2)

## 2020-11-24 LAB — GLUCOSE, CAPILLARY
Glucose-Capillary: 83 mg/dL (ref 70–99)
Glucose-Capillary: 76 mg/dL (ref 70–99)
Glucose-Capillary: 90 mg/dL (ref 70–99)
Glucose-Capillary: 99 mg/dL (ref 70–99)

## 2020-11-24 MED ORDER — VANCOMYCIN HCL 125 MG PO CAPS
125.0000 mg | ORAL_CAPSULE | Freq: Four times a day (QID) | ORAL | 0 refills | Status: DC
  Filled 2020-11-24: qty 34, 9d supply, fill #0

## 2020-11-24 NOTE — Progress Notes (Addendum)
PROGRESS NOTE    Sheryl Lowe  WUJ:[id number] DOB: [birth year] DOA: 11/23/2020 PCP: Pollyann Savoy, NP      Brief Narrative: Patient reports progression of diarrhea she attributed to previous cholecystectomy, now 'explosive' and very frequent. Reports recent use of several antibiotics for pyelonephritis.  Assessment & Plan:   Active Problems:   Clostridium difficile colitis - po vancomycin, maintain fluid and electrolyte balance  Nausea -ondansetron -currently on a full liquid diet  DM -SSI   HTN -irbesartan  Anxiety -hold home citalopram while QT remains prolonged    DVT prophylaxis: lovenox Code Status: full Disposition Plan: current liquid diet and maintaining hydration. Once abdominal pain and diet improve, with decreased number and volume of bowel movements, she will be able to go home on PO vancomycin. With resolution of acute symptoms, current possible pancytopenia can be re-evaluated and investigated further if needed.    Antimicrobials: vancomycin PO   Subjective: Patient reports numerous voluminous watery stools after completing several antibiotic courses. She reports abdominal pain and urgency in bowel movements, currently improving to 5-6 bowel movements last night.   Objective: Vitals:   11/23/20 1915 11/23/20 1935 11/23/20 2112 11/24/20 0423  BP:  105/69 109/69 (!) 117/44  Pulse:  94 79 87  Resp: 15 15  20   Temp:   97.9 F (36.6 C) 98 F (36.7 C)  TempSrc:   Oral Oral  SpO2: 94% 93% 97% 97%  Weight:      Height:        Intake/Output Summary (Last 24 hours) at 11/24/2020 1144 Last data filed at 11/24/2020 0900 Gross per 24 hour  Intake 1241.65 ml  Output --  Net 1241.65 ml   Filed Weights   11/23/20 0930  Weight: 68 kg    Examination:  General exam: Appears calm and comfortable, pink, warm, dry, attentive, cooperative Respiratory system: Clear to auscultation. Respiratory effort normal. Cardiovascular system: S1 & S2 heard, RRR.  No JVD, murmurs, rubs, gallops or clicks. No pedal edema. Gastrointestinal system: Abdomen is nondistended, soft and nontender. No organomegaly or masses felt. hyperactive bowel sounds heard. Central nervous system: Alert and oriented. No focal neurological deficits. Extremities: moves all extremities appropriately, non edematous Skin: no obvious rashes, bruising, or erythema  Psychiatry: Judgement and insight appear normal. Mood & affect appropriate.     Data Reviewed: I have personally reviewed following labs and imaging studies  CBC: Recent Labs  Lab 11/23/20 1004 11/24/20 0523  WBC 14.6* 3.4*  NEUTROABS 11.8*  --   HGB 12.9 9.7*  HCT 38.3 31.0*  MCV 88.7 92.8  PLT 277 147*   Basic Metabolic Panel: Recent Labs  Lab 11/23/20 1004 11/24/20 0523  NA 136 140  K 3.2* 4.1  CL 103 112*  CO2 23 23  GLUCOSE 137* 88  BUN 18 9  CREATININE 1.04* 0.88  CALCIUM 8.9 8.3*  MG 1.8  --    GFR: Estimated Creatinine Clearance: 60.7 mL/min (by C-G formula based on SCr of 0.88 mg/dL). Liver Function Tests: Recent Labs  Lab 11/23/20 1004  AST 26  ALT 19  ALKPHOS 77  BILITOT 0.6  PROT 8.4*  ALBUMIN 4.0    HbA1C: Recent Labs    11/23/20 1650  HGBA1C 6.3*   CBG: Recent Labs  Lab 11/23/20 2034 11/24/20 0520 11/24/20 0728  GLUCAP 81 83 76    Urine analysis:    Component Value Date/Time   COLORURINE YELLOW 11/23/2020 1004   APPEARANCEUR CLEAR 11/23/2020 1004   LABSPEC 1.025  11/23/2020 1004   PHURINE 5.5 11/23/2020 1004   GLUCOSEU NEGATIVE 11/23/2020 1004   GLUCOSEU NEGATIVE 05/03/2016 1424   HGBUR TRACE (A) 11/23/2020 1004   BILIRUBINUR SMALL (A) 11/23/2020 1004   KETONESUR 15 (A) 11/23/2020 1004   PROTEINUR TRACE (A) 11/23/2020 1004   UROBILINOGEN 0.2 05/03/2016 1424   NITRITE NEGATIVE 11/23/2020 1004   LEUKOCYTESUR SMALL (A) 11/23/2020 1004   Sepsis Labs: @LABRCNTIP (procalcitonin:4,lacticidven:4)  ) Recent Results (from the past 240 hour(s))  C  Difficile Quick Screen w PCR reflex     Status: Abnormal   Collection Time: 11/23/20 10:04 AM   Specimen: STOOL  Result Value Ref Range Status   C Diff antigen POSITIVE (A) NEGATIVE Final   C Diff toxin POSITIVE (A) NEGATIVE Final   C Diff interpretation Toxin producing C. difficile detected.  Final    Comment: CRITICAL RESULT CALLED TO, READ BACK BY AND VERIFIED WITH: DEVOLT, T AT 1113 ON 9.20.22 BY RUCINSKI,B Performed at [facility], [address], [place], [place]   Resp Panel by RT-PCR (Flu A&B, Covid) Nasopharyngeal Swab     Status: None   Collection Time: 11/23/20  3:25 PM   Specimen: Nasopharyngeal Swab; Nasopharyngeal(NP) swabs in vial transport medium  Result Value Ref Range Status   SARS Coronavirus 2 by RT PCR NEGATIVE NEGATIVE Final    Comment: (NOTE) SARS-CoV-2 target nucleic acids are NOT DETECTED.  The SARS-CoV-2 RNA is generally detectable in upper respiratory specimens during the acute phase of infection. The lowest concentration of SARS-CoV-2 viral copies this assay can detect is 138 copies/mL. A negative result does not preclude SARS-Cov-2 infection and should not be used as the sole basis for treatment or other patient management decisions. A negative result may occur with  improper specimen collection/handling, submission of specimen other than nasopharyngeal swab, presence of viral mutation(s) within the areas targeted by this assay, and inadequate number of viral copies(<138 copies/mL). A negative result must be combined with clinical observations, patient history, and epidemiological information. The expected result is Negative.  Fact Sheet for Patients:  11/25/20  Fact Sheet for Healthcare Providers:  BloggerCourse.com  This test is no t yet approved or cleared by the [employer] FDA and  has been authorized for detection and/or diagnosis of SARS-CoV-2 by FDA under an Emergency Use  Authorization (EUA). This EUA will remain  in effect (meaning this test can be used) for the duration of the COVID-19 declaration under Section 564(b)(1) of the Act, 21 U.S.C.section 360bbb-3(b)(1), unless the authorization is terminated  or revoked sooner.       Influenza A by PCR NEGATIVE NEGATIVE Final   Influenza B by PCR NEGATIVE NEGATIVE Final    Comment: (NOTE) The Xpert Xpress SARS-CoV-2/FLU/RSV plus assay is intended as an aid in the diagnosis of influenza from Nasopharyngeal swab specimens and should not be used as a sole basis for treatment. Nasal washings and aspirates are unacceptable for Xpert Xpress SARS-CoV-2/FLU/RSV testing.  Fact Sheet for Patients: [place]  Fact Sheet for Healthcare Providers: BloggerCourse.com  This test is not yet approved or cleared by the [employer]ious[employer]ker.it FDA and has been authorized for detection and/or diagnosis of SARS-CoV-2 by FDA under an Emergency Use Authorization (EUA). This EUA will remain in effect (meaning this test can be used) for the duration of the COVID-19 declaration under Section 564(b)(1) of the Act, 21 U.S.C. section 360bbb-3(b)(1), unless the authorization is terminated or revoked.  Performed at [facility], [address] [place], [place]  Culture, blood (routine x 2)     Status: None (Preliminary result)   Collection Time: 11/23/20  5:00 PM   Specimen: BLOOD RIGHT HAND  Result Value Ref Range Status   Specimen Description BLOOD RIGHT HAND  Final   Special Requests   Final    BOTTLES DRAWN AEROBIC AND ANAEROBIC Blood Culture adequate volume   Culture   Final    NO GROWTH < 24 HOURS Performed at [facility], [address], [place], [place] [address]    Report Status PENDING  Incomplete  Culture, blood (routine x 2)     Status: None (Preliminary result)   Collection Time: 11/23/20  5:00 PM   Specimen: BLOOD LEFT FOREARM  Result Value Ref Range  Status   Specimen Description BLOOD LEFT FOREARM  Final   Special Requests   Final    BOTTLES DRAWN AEROBIC AND ANAEROBIC Blood Culture results may not be optimal due to an inadequate volume of blood received in culture bottles   Culture   Final    NO GROWTH < 24 HOURS Performed at [facility], [address], [place], [place] [address]    Report Status PENDING  Incomplete         Radiology Studies: CT Abdomen Pelvis W Contrast  Result Date: 11/23/2020 CLINICAL DATA:  Infectious gastroenteritis or colitis EXAM: CT ABDOMEN AND PELVIS WITH CONTRAST TECHNIQUE: Multidetector CT imaging of the abdomen and pelvis was performed using the standard protocol following bolus administration of intravenous contrast. CONTRAST:  63mL OMNIPAQUE IOHEXOL 350 MG/ML SOLN COMPARISON:  10/31/2020 FINDINGS: Lower chest: Scattered small calcified granulomas within the lung bases. Lung bases are otherwise clear. Heart size is normal. Hepatobiliary: Mildly decreased attenuation of the hepatic parenchyma. No focal hepatic lesion is identified. Prior cholecystectomy. No intrahepatic biliary dilatation. Pancreas: Unremarkable. No pancreatic ductal dilatation or surrounding inflammatory changes. Spleen: Normal in size without focal abnormality. Adrenals/Urinary Tract: Unremarkable adrenal glands. Subcentimeter low-density lesions within the right kidney, too small to definitively characterize but most likely represent cysts. Kidneys appear otherwise unremarkable with symmetric enhancement. No renal stone or hydronephrosis. Urinary bladder appears unremarkable for the degree of distension. Stomach/Bowel: Small hiatal hernia. Stomach otherwise unremarkable. Oral contrast is present within the distal small bowel and colon. No dilated loops of bowel to suggest obstruction. There is mild diffuse wall thickening involving the colon. There are a few scattered left-sided diverticula. Vascular/Lymphatic: Scattered aortoiliac  atherosclerotic calcifications without aneurysm. No abdominopelvic lymphadenopathy. Reproductive: Uterus and bilateral adnexa are unremarkable. Other: No free fluid. No abdominopelvic fluid collection. No pneumoperitoneum. No abdominal wall hernia. Musculoskeletal: Degenerative disc disease at L5-S1. No acute osseous abnormality. Soft tissues appear within normal limits. IMPRESSION: 1. Mild diffuse wall thickening involving the colon suggestive of a nonspecific infectious or inflammatory colitis. 2. Mild left-sided colonic diverticulosis without evidence to suggest an acute diverticulitis. 3. Small hiatal hernia. 4. Hepatic steatosis. 5. Aortic atherosclerosis (ICD10-I70.0). Electronically Signed   By: Duanne Guess D.O.   On: 11/23/2020 13:41        Scheduled Meds:  enoxaparin (LOVENOX) injection  40 mg Subcutaneous Q24H   insulin aspart  0-9 Units Subcutaneous Q8H   irbesartan  75 mg Oral Daily   vancomycin  125 mg Oral QID   Continuous Infusions:  0.9 % NaCl with KCl 40 mEq / L 100 mL/hr at 11/24/20 0521     LOS: 1 day        Randell Loop, student [facility] Pager [phone]  If  7PM-7AM, please contact night-coverage [email or web address] Password [facility] 11/24/2020, 11:44 AM

## 2020-11-24 NOTE — TOC Benefit Eligibility Note (Signed)
Patient Advocate Encounter  Prior Authorization for Vancomycin 125 mg caps has been approved.    PA# [id number] Effective dates: 11/24/2020 through 02/22/2021  Patients co-pay is $45.00.     Roland Earl, CPhT Pharmacy Patient Advocate Specialist [place] Antimicrobial Stewardship Team Direct Number: [phone]  Fax: [phone]

## 2020-11-25 LAB — BASIC METABOLIC PANEL WITH GFR
Anion gap: 7 (ref 5–15)
BUN: 7 mg/dL (ref 6–20)
CO2: 22 mmol/L (ref 22–32)
Calcium: 8.5 mg/dL — ABNORMAL LOW (ref 8.9–10.3)
Chloride: 111 mmol/L (ref 98–111)
Creatinine, Ser: 0.7 mg/dL (ref 0.44–1.00)
GFR, Estimated: >60 mL/min
Glucose, Bld: 95 mg/dL (ref 70–99)
Potassium: 3.9 mmol/L (ref 3.5–5.1)
Sodium: 140 mmol/L (ref 135–145)

## 2020-11-25 LAB — CBC
HCT: 32 % — ABNORMAL LOW (ref 36.0–46.0)
Hemoglobin: 10.3 g/dL — ABNORMAL LOW (ref 12.0–15.0)
MCH: 30.4 pg (ref 26.0–34.0)
MCHC: 32.2 g/dL (ref 30.0–36.0)
MCV: 94.4 fL (ref 80.0–100.0)
Platelets: 131 10*3/uL — ABNORMAL LOW (ref 150–400)
RBC: 3.39 MIL/uL — ABNORMAL LOW (ref 3.87–5.11)
RDW: 13.3 % (ref 11.5–15.5)
WBC: 3.7 10*3/uL — ABNORMAL LOW (ref 4.0–10.5)
nRBC: 0 % (ref 0.0–0.2)

## 2020-11-25 LAB — GLUCOSE, CAPILLARY: Glucose-Capillary: 93 mg/dL (ref 70–99)

## 2020-11-25 LAB — MAGNESIUM: Magnesium: 2 mg/dL (ref 1.7–2.4)

## 2020-11-25 NOTE — Discharge Summary (Signed)
Physician Discharge Summary  Sheryl Lowe LZJ:[id number] DOB: [birth year] DOA: 11/23/2020  PCP: Pollyann Savoy, NP  Admit date: 11/23/2020  Discharge date: 11/25/2020  Admitted From:Home  Disposition:  Home  Recommendations for Outpatient Follow-up:  Follow up with PCP in 1-2 weeks, repeat CBC in 1 week Finish course of treatment for oral vancomycin as ordered for C. difficile colitis May use probiotics over-the-counter as needed Continue other home medications as previous Referral to hematology sent for follow-up regarding pancytopenia.  Patient will receive a call for follow-up  Home Health: None  Equipment/Devices: None  Discharge Condition:Stable  CODE STATUS: Full  Diet recommendation: Heart Healthy/carb modified  Brief/Interim Summary: Sheryl Lowe is a 60 y.o. female with medical history significant for diabetes mellitus, hypertension, anxiety.  Patient presented to the ED with complaints of multiple loose stools.  She was diagnosed with C. difficile colitis and had some initial SIRS criteria.  She was started on IV fluid hydration as well as potassium supplementation given some hypokalemia related to her diarrhea.  She was started on oral vancomycin with significant improvement over the course of 2 days of hospitalization.  She now states that she has minimal bowel movements and very little abdominal pain.  She is able to tolerate diet and is stable for discharge.  She has been noted to have some pancytopenia after IV fluid hydration which will require outpatient follow-up to hematology.  No other acute events noted.  Discharge Diagnoses:  Active Problems:   Clostridium difficile colitis  Principal discharge diagnosis: Clostridium difficile colitis.  Discharge Instructions  Discharge Instructions     Diet - low sodium heart healthy   Complete by: As directed    Increase activity slowly   Complete by: As directed       Allergies as of 11/25/2020       Reactions    Amitriptyline    Drowsiness in the morning   Topamax [topiramate]    Memory loss, tingling in hands and feet        Medication List     STOP taking these medications    benzonatate 100 MG capsule Commonly known as: TESSALON   candesartan-hydrochlorothiazide 32-12.5 MG tablet Commonly known as: ATACAND HCT   cefpodoxime 200 MG tablet Commonly known as: VANTIN   fluticasone 50 MCG/ACT nasal spray Commonly known as: FLONASE   ondansetron 4 MG disintegrating tablet Commonly known as: Zofran ODT   tamsulosin 0.4 MG Caps capsule Commonly known as: FLOMAX       TAKE these medications    atorvastatin 10 MG tablet Commonly known as: LIPITOR Take 1 tablet (10 mg total) by mouth daily. Need f/u visit for additional refills   citalopram 10 MG tablet Commonly known as: CELEXA TAKE 1 TABLET BY MOUTH EVERY DAY   omeprazole 20 MG capsule Commonly known as: PRILOSEC Take 1 capsule (20 mg total) by mouth daily.   rizatriptan 10 MG tablet Commonly known as: MAXALT TAKE 1 TABLET BY MOUTH AS NEEDED FOR MIGRAINE**MAY REPEAT IN 2 HOURS IF NEEDED**   valsartan-hydrochlorothiazide 80-12.5 MG tablet Commonly known as: DIOVAN-HCT Take 1 tablet by mouth daily.   vancomycin 125 MG capsule Commonly known as: VANCOCIN Take 1 capsule (125 mg total) by mouth 4 (four) times daily.        Follow-up Information     Chapell, Waldron Session, NP. Schedule an appointment as soon as possible for a visit.   Specialty: Family Medicine Contact information: [address] [place] [address] [phone]  Doreatha Massed, MD. Go to.   Specialty: Hematology Contact information: [address] [place] [address] [phone]                Allergies  Allergen Reactions   Amitriptyline     Drowsiness in the morning   Topamax [Topiramate]     Memory loss, tingling in hands and feet    Consultations: None   Procedures/Studies: CT Abdomen Pelvis W  Contrast  Result Date: 11/23/2020 CLINICAL DATA:  Infectious gastroenteritis or colitis EXAM: CT ABDOMEN AND PELVIS WITH CONTRAST TECHNIQUE: Multidetector CT imaging of the abdomen and pelvis was performed using the standard protocol following bolus administration of intravenous contrast. CONTRAST:  72mL OMNIPAQUE IOHEXOL 350 MG/ML SOLN COMPARISON:  10/31/2020 FINDINGS: Lower chest: Scattered small calcified granulomas within the lung bases. Lung bases are otherwise clear. Heart size is normal. Hepatobiliary: Mildly decreased attenuation of the hepatic parenchyma. No focal hepatic lesion is identified. Prior cholecystectomy. No intrahepatic biliary dilatation. Pancreas: Unremarkable. No pancreatic ductal dilatation or surrounding inflammatory changes. Spleen: Normal in size without focal abnormality. Adrenals/Urinary Tract: Unremarkable adrenal glands. Subcentimeter low-density lesions within the right kidney, too small to definitively characterize but most likely represent cysts. Kidneys appear otherwise unremarkable with symmetric enhancement. No renal stone or hydronephrosis. Urinary bladder appears unremarkable for the degree of distension. Stomach/Bowel: Small hiatal hernia. Stomach otherwise unremarkable. Oral contrast is present within the distal small bowel and colon. No dilated loops of bowel to suggest obstruction. There is mild diffuse wall thickening involving the colon. There are a few scattered left-sided diverticula. Vascular/Lymphatic: Scattered aortoiliac atherosclerotic calcifications without aneurysm. No abdominopelvic lymphadenopathy. Reproductive: Uterus and bilateral adnexa are unremarkable. Other: No free fluid. No abdominopelvic fluid collection. No pneumoperitoneum. No abdominal wall hernia. Musculoskeletal: Degenerative disc disease at L5-S1. No acute osseous abnormality. Soft tissues appear within normal limits. IMPRESSION: 1. Mild diffuse wall thickening involving the colon suggestive  of a nonspecific infectious or inflammatory colitis. 2. Mild left-sided colonic diverticulosis without evidence to suggest an acute diverticulitis. 3. Small hiatal hernia. 4. Hepatic steatosis. 5. Aortic atherosclerosis (ICD10-I70.0). Electronically Signed   By: Duanne Guess D.O.   On: 11/23/2020 13:41   CT Renal Stone Study  Result Date: 10/31/2020 CLINICAL DATA:  Left lung pain.  Concern for kidney stone. EXAM: CT ABDOMEN AND PELVIS WITHOUT CONTRAST TECHNIQUE: Multidetector CT imaging of the abdomen and pelvis was performed following the standard protocol without IV contrast. COMPARISON:  CT abdomen pelvis report dated 10/11/2018. The images are not available for direct comparison. FINDINGS: Evaluation of this exam is limited in the absence of intravenous contrast. Lower chest: The visualized lung bases are clear. No intra-abdominal free air or free fluid. Hepatobiliary: Fatty liver. No intrahepatic biliary ductal dilatation. Cholecystectomy. No retained calcified stone noted in the central CBD. Pancreas: Unremarkable. No pancreatic ductal dilatation or surrounding inflammatory changes. Spleen: Normal in size without focal abnormality. Adrenals/Urinary Tract: The adrenal glands are unremarkable. The kidneys, visualized ureters, and urinary bladder appear unremarkable Stomach/Bowel: There is sigmoid diverticulosis. Mild perisigmoid haziness, likely chronic. There is mild thickened appearance of the distal colon likely related to underdistention. Mild colitis is less likely. Clinical correlation is recommended. There is no bowel obstruction. The appendix is normal. Vascular/Lymphatic: Mild aortoiliac atherosclerotic disease. The IVC is unremarkable. The pain venous gas. There is no adenopathy. Reproductive: The uterus is anteverted and grossly unremarkable. No adnexal masses. Other: None Musculoskeletal: No acute or significant osseous findings. IMPRESSION: 1. No acute intra-abdominal or pelvic pathology. No  hydronephrosis or nephrolithiasis. 2. Sigmoid diverticulosis. Underdistention versus less likely mild colitis of the distal colon. Clinical correlation is recommended. No bowel obstruction. Normal appendix. 3. Fatty liver. 4. Aortic Atherosclerosis (ICD10-I70.0). Electronically Signed   By: Elgie Collard M.D.   On: 10/31/2020 19:56     Discharge Exam: Vitals:   11/24/20 2010 11/25/20 0454  BP: 131/72 (!) 92/54  Pulse: 76 62  Resp: 18 18  Temp: 98.5 F (36.9 C) 98 F (36.7 C)  SpO2: 95% 95%   Vitals:   11/24/20 0423 11/24/20 1204 11/24/20 2010 11/25/20 0454  BP: (!) 117/44 114/71 131/72 (!) 92/54  Pulse: 87 85 76 62  Resp: 20 18 18 18   Temp: 98 F (36.7 C) 98.4 F (36.9 C) 98.5 F (36.9 C) 98 F (36.7 C)  TempSrc: Oral Oral Oral Oral  SpO2: 97% 96% 95% 95%  Weight:      Height:        General: Pt is alert, awake, not in acute distress Cardiovascular: RRR, S1/S2 +, no rubs, no gallops Respiratory: CTA bilaterally, no wheezing, no rhonchi Abdominal: Soft, NT, ND, bowel sounds + Extremities: no edema, no cyanosis    The results of significant diagnostics from this hospitalization (including imaging, microbiology, ancillary and laboratory) are listed below for reference.     Microbiology: Recent Results (from the past 240 hour(s))  C Difficile Quick Screen w PCR reflex     Status: Abnormal   Collection Time: 11/23/20 10:04 AM   Specimen: STOOL  Result Value Ref Range Status   C Diff antigen POSITIVE (A) NEGATIVE Final   C Diff toxin POSITIVE (A) NEGATIVE Final   C Diff interpretation Toxin producing C. difficile detected.  Final    Comment: CRITICAL RESULT CALLED TO, READ BACK BY AND VERIFIED WITH: DEVOLT, T AT 1113 ON 9.20.22 BY RUCINSKI,B Performed at [facility], [address], [place], [place]   Resp Panel by RT-PCR (Flu A&B, Covid) Nasopharyngeal Swab     Status: None   Collection Time: 11/23/20  3:25 PM   Specimen: Nasopharyngeal Swab;  Nasopharyngeal(NP) swabs in vial transport medium  Result Value Ref Range Status   SARS Coronavirus 2 by RT PCR NEGATIVE NEGATIVE Final    Comment: (NOTE) SARS-CoV-2 target nucleic acids are NOT DETECTED.  The SARS-CoV-2 RNA is generally detectable in upper respiratory specimens during the acute phase of infection. The lowest concentration of SARS-CoV-2 viral copies this assay can detect is 138 copies/mL. A negative result does not preclude SARS-Cov-2 infection and should not be used as the sole basis for treatment or other patient management decisions. A negative result may occur with  improper specimen collection/handling, submission of specimen other than nasopharyngeal swab, presence of viral mutation(s) within the areas targeted by this assay, and inadequate number of viral copies(<138 copies/mL). A negative result must be combined with clinical observations, patient history, and epidemiological information. The expected result is Negative.  Fact Sheet for Patients:  11/25/20  Fact Sheet for Healthcare Providers:  BloggerCourse.com  This test is no t yet approved or cleared by the [place] FDA and  has been authorized for detection and/or diagnosis of SARS-CoV-2 by FDA under an Emergency Use Authorization (EUA). This EUA will remain  in effect (meaning this test can be used) for the duration of the COVID-19 declaration under Section 564(b)(1) of the Act, 21 U.S.C.section 360bbb-3(b)(1), unless the authorization is terminated  or revoked sooner.       Influenza A by PCR  NEGATIVE NEGATIVE Final   Influenza B by PCR NEGATIVE NEGATIVE Final    Comment: (NOTE) The Xpert Xpress SARS-CoV-2/FLU/RSV plus assay is intended as an aid in the diagnosis of influenza from Nasopharyngeal swab specimens and should not be used as a sole basis for treatment. Nasal washings and aspirates are unacceptable for Xpert Xpress  SARS-CoV-2/FLU/RSV testing.  Fact Sheet for Patients: BloggerCourse.com  Fact Sheet for Healthcare Providers: SeriousBroker.it  This test is not yet approved or cleared by the [place] FDA and has been authorized for detection and/or diagnosis of SARS-CoV-2 by FDA under an Emergency Use Authorization (EUA). This EUA will remain in effect (meaning this test can be used) for the duration of the COVID-19 declaration under Section 564(b)(1) of the Act, 21 U.S.C. section 360bbb-3(b)(1), unless the authorization is terminated or revoked.  Performed at [facility], [address], [place], [place] [address]   Culture, blood (routine x 2)     Status: None (Preliminary result)   Collection Time: 11/23/20  5:00 PM   Specimen: BLOOD RIGHT HAND  Result Value Ref Range Status   Specimen Description BLOOD RIGHT HAND  Final   Special Requests   Final    BOTTLES DRAWN AEROBIC AND ANAEROBIC Blood Culture adequate volume   Culture   Final    NO GROWTH 2 DAYS Performed at [facility], [address], [place], [place] [address]    Report Status PENDING  Incomplete  Culture, blood (routine x 2)     Status: None (Preliminary result)   Collection Time: 11/23/20  5:00 PM   Specimen: BLOOD LEFT FOREARM  Result Value Ref Range Status   Specimen Description BLOOD LEFT FOREARM  Final   Special Requests   Final    BOTTLES DRAWN AEROBIC AND ANAEROBIC Blood Culture results may not be optimal due to an inadequate volume of blood received in culture bottles   Culture   Final    NO GROWTH 2 DAYS Performed at [facility], [address], [place], [place] [address]    Report Status PENDING  Incomplete     Labs: BNP (last 3 results) No results for input(s): BNP in the last 8760 hours. Basic Metabolic Panel: Recent Labs  Lab 11/23/20 1004 11/24/20 0523 11/25/20 0545  NA 136 140 140  K 3.2* 4.1 3.9  CL 103 112* 111  CO2 23 23 22   GLUCOSE 137*  88 95  BUN 18 9 7   CREATININE 1.04* 0.88 0.70  CALCIUM 8.9 8.3* 8.5*  MG 1.8  --  2.0   Liver Function Tests: Recent Labs  Lab 11/23/20 1004  AST 26  ALT 19  ALKPHOS 77  BILITOT 0.6  PROT 8.4*  ALBUMIN 4.0   No results for input(s): LIPASE, AMYLASE in the last 168 hours. No results for input(s): AMMONIA in the last 168 hours. CBC: Recent Labs  Lab 11/23/20 1004 11/24/20 0523 11/25/20 0545  WBC 14.6* 3.4* 3.7*  NEUTROABS 11.8*  --   --   HGB 12.9 9.7* 10.3*  HCT 38.3 31.0* 32.0*  MCV 88.7 92.8 94.4  PLT 277 147* 131*   Cardiac Enzymes: No results for input(s): CKTOTAL, CKMB, CKMBINDEX, TROPONINI in the last 168 hours. BNP: Invalid input(s): POCBNP CBG: Recent Labs  Lab 11/24/20 0520 11/24/20 0728 11/24/20 1421 11/24/20 2121 11/25/20 0517  GLUCAP 83 76 90 99 93   D-Dimer No results for input(s): DDIMER in the last 72 hours. Hgb A1c Recent Labs    11/23/20 1650  HGBA1C  6.3*   Lipid Profile No results for input(s): CHOL, HDL, LDLCALC, TRIG, CHOLHDL, LDLDIRECT in the last 72 hours. Thyroid function studies No results for input(s): TSH, T4TOTAL, T3FREE, THYROIDAB in the last 72 hours.  Invalid input(s): FREET3 Anemia work up No results for input(s): VITAMINB12, FOLATE, FERRITIN, TIBC, IRON, RETICCTPCT in the last 72 hours. Urinalysis    Component Value Date/Time   COLORURINE YELLOW 11/23/2020 1004   APPEARANCEUR CLEAR 11/23/2020 1004   LABSPEC 1.025 11/23/2020 1004   PHURINE 5.5 11/23/2020 1004   GLUCOSEU NEGATIVE 11/23/2020 1004   GLUCOSEU NEGATIVE 05/03/2016 1424   HGBUR TRACE (A) 11/23/2020 1004   BILIRUBINUR SMALL (A) 11/23/2020 1004   KETONESUR 15 (A) 11/23/2020 1004   PROTEINUR TRACE (A) 11/23/2020 1004   UROBILINOGEN 0.2 05/03/2016 1424   NITRITE NEGATIVE 11/23/2020 1004   LEUKOCYTESUR SMALL (A) 11/23/2020 1004   Sepsis Labs Invalid input(s): PROCALCITONIN,  WBC,  LACTICIDVEN Microbiology Recent Results (from the past 240 hour(s))  C  Difficile Quick Screen w PCR reflex     Status: Abnormal   Collection Time: 11/23/20 10:04 AM   Specimen: STOOL  Result Value Ref Range Status   C Diff antigen POSITIVE (A) NEGATIVE Final   C Diff toxin POSITIVE (A) NEGATIVE Final   C Diff interpretation Toxin producing C. difficile detected.  Final    Comment: CRITICAL RESULT CALLED TO, READ BACK BY AND VERIFIED WITH: DEVOLT, T AT 1113 ON 9.20.22 BY RUCINSKI,B Performed at [facility], [address] [place], [place] [address]   Resp Panel by RT-PCR (Flu A&B, Covid) Nasopharyngeal Swab     Status: None   Collection Time: 11/23/20  3:25 PM   Specimen: Nasopharyngeal Swab; Nasopharyngeal(NP) swabs in vial transport medium  Result Value Ref Range Status   SARS Coronavirus 2 by RT PCR NEGATIVE NEGATIVE Final    Comment: (NOTE) SARS-CoV-2 target nucleic acids are NOT DETECTED.  The SARS-CoV-2 RNA is generally detectable in upper respiratory specimens during the acute phase of infection. The lowest concentration of SARS-CoV-2 viral copies this assay can detect is 138 copies/mL. A negative result does not preclude SARS-Cov-2 infection and should not be used as the sole basis for treatment or other patient management decisions. A negative result may occur with  improper specimen collection/handling, submission of specimen other than nasopharyngeal swab, presence of viral mutation(s) within the areas targeted by this assay, and inadequate number of viral copies(<138 copies/mL). A negative result must be combined with clinical observations, patient history, and epidemiological information. The expected result is Negative.  Fact Sheet for Patients:  BloggerCourse.com  Fact Sheet for Healthcare Providers:  [employer]ious[employer]ker[employer]it  This test is no t yet approved or cleared by the [place] FDA and  has been authorized for detection and/or diagnosis of SARS-CoV-2 by FDA under an Emergency Use  Authorization (EUA). This EUA will remain  in effect (meaning this test can be used) for the duration of the COVID-19 declaration under Section 564(b)(1) of the Act, 21 U.S.C.section 360bbb-3(b)(1), unless the authorization is terminated  or revoked sooner.       Influenza A by PCR NEGATIVE NEGATIVE Final   Influenza B by PCR NEGATIVE NEGATIVE Final    Comment: (NOTE) The Xpert Xpress SARS-CoV-2/FLU/RSV plus assay is intended as an aid in the diagnosis of influenza from Nasopharyngeal swab specimens and should not be used as a sole basis for treatment. Nasal washings and aspirates are unacceptable for Xpert Xpress SARS-CoV-2/FLU/RSV testing.  Fact Sheet for Patients: BloggerCourse.com  Fact Sheet for Healthcare Providers: SeriousBroker.it  This test is not yet approved or cleared by the [place] FDA and has been authorized for detection and/or diagnosis of SARS-CoV-2 by FDA under an Emergency Use Authorization (EUA). This EUA will remain in effect (meaning this test can be used) for the duration of the COVID-19 declaration under Section 564(b)(1) of the Act, 21 U.S.C. section 360bbb-3(b)(1), unless the authorization is terminated or revoked.  Performed at [facility], [address] [place], [place] [address]   Culture, blood (routine x 2)     Status: None (Preliminary result)   Collection Time: 11/23/20  5:00 PM   Specimen: BLOOD RIGHT HAND  Result Value Ref Range Status   Specimen Description BLOOD RIGHT HAND  Final   Special Requests   Final    BOTTLES DRAWN AEROBIC AND ANAEROBIC Blood Culture adequate volume   Culture   Final    NO GROWTH 2 DAYS Performed at [facility], [address] [place], [place] [address]    Report Status PENDING  Incomplete  Culture, blood (routine x 2)     Status: None (Preliminary result)   Collection Time: 11/23/20  5:00 PM   Specimen: BLOOD LEFT FOREARM  Result Value Ref Range  Status   Specimen Description BLOOD LEFT FOREARM  Final   Special Requests   Final    BOTTLES DRAWN AEROBIC AND ANAEROBIC Blood Culture results may not be optimal due to an inadequate volume of blood received in culture bottles   Culture   Final    NO GROWTH 2 DAYS Performed at [facility], [address], [place], [place] [address]    Report Status PENDING  Incomplete     Time coordinating discharge: 35 minutes  SIGNED:   Erick Blinks, DO [facility] 11/25/2020, 11:04 AM  If 7PM-7AM, please contact night-coverage [email or web address]

## 2020-11-28 LAB — CULTURE, BLOOD (ROUTINE X 2)
Culture: NO GROWTH
Culture: NO GROWTH
Special Requests: ADEQUATE

## 2020-12-01 ENCOUNTER — Other Ambulatory Visit: Payer: Self-pay | Admitting: Internal Medicine

## 2020-12-09 NOTE — Progress Notes (Signed)
[facility] [address] [place] [address]   CLINIC:  Medical Oncology/Hematology  Patient Care Team: Chapell, Waldron Session, NP as PCP - General (Family Medicine)  CHIEF COMPLAINTS/PURPOSE OF CONSULTATION:  Evaluation of pancytopenia  HISTORY OF PRESENTING ILLNESS:  Sheryl Lowe 60 y.o. female is here because of evaluation of pancytopenia, at the request of Dr. Sherryll Burger.  Today she reports feeling good. She was hospitalized in July for C. Diff arising from a UTI for which she took a course of vancomycin. She denies fevers and unexpected weight loss although he reports night sweats associated with hot flashes. She has never required blood fusions and has not extensively donated blood. She denies history of vitamin B-12 deficiency. Prior to menopause she reports she had very light menses. She lives at home with family, and she works at [employer] in [place]. She denies smoking history. Her mother has a history of MI and CVA. Her father had prostate cancer. She denies family history of anemia.   MEDICAL HISTORY:  Past Medical History:  Diagnosis Date   Anxiety    Hyperlipidemia    Hypertension    Kidney stones    Migraine     SURGICAL HISTORY: Past Surgical History:  Procedure Laterality Date   CHOLECYSTECTOMY  2020    SOCIAL HISTORY: Social History   Socioeconomic History   Marital status: Married    Spouse name: Not on file   Number of children: Not on file   Years of education: Not on file   Highest education level: Not on file  Occupational History   Not on file  Tobacco Use   Smoking status: Never   Smokeless tobacco: Never  Vaping Use   Vaping Use: Never used  Substance and Sexual Activity   Alcohol use: Yes   Drug use: No   Sexual activity: Not on file  Other Topics Concern   Not on file  Social History Narrative   Not on file   Social Determinants of Health   Financial Resource Strain: Not on file  Food Insecurity: Not on file   Transportation Needs: Not on file  Physical Activity: Not on file  Stress: Not on file  Social Connections: Not on file  Intimate Partner Violence: Not on file    FAMILY HISTORY: Family History  Problem Relation Age of Onset   Stroke Mother    Heart disease Mother    Hypertension Father    Cancer Father     ALLERGIES:  is allergic to amitriptyline and topamax [topiramate].  MEDICATIONS:  Current Outpatient Medications  Medication Sig Dispense Refill   atorvastatin (LIPITOR) 10 MG tablet Take 1 tablet (10 mg total) by mouth daily. Need f/u visit for additional refills 30 tablet 0   citalopram (CELEXA) 10 MG tablet TAKE 1 TABLET BY MOUTH EVERY DAY (Patient not taking: Reported on 11/23/2020) 90 tablet 2   omeprazole (PRILOSEC) 20 MG capsule Take 1 capsule (20 mg total) by mouth daily. 30 capsule 3   rizatriptan (MAXALT) 10 MG tablet TAKE 1 TABLET BY MOUTH AS NEEDED FOR MIGRAINE**MAY REPEAT IN 2 HOURS IF NEEDED** 9 tablet 0   valsartan-hydrochlorothiazide (DIOVAN-HCT) 80-12.5 MG tablet Take 1 tablet by mouth daily.     vancomycin (VANCOCIN) 125 MG capsule Take 1 capsule (125 mg total) by mouth 4 (four) times daily. 34 capsule 0   No current facility-administered medications for this visit.    REVIEW OF SYSTEMS:   Review of Systems  Constitutional:  Negative for fever and unexpected weight change.  Endocrine: Positive for hot flashes.  All other systems reviewed and are negative.   PHYSICAL EXAMINATION: ECOG PERFORMANCE STATUS: 0 - Asymptomatic  There were no vitals filed for this visit. There were no vitals filed for this visit. Physical Exam Vitals reviewed.  Constitutional:      Appearance: Normal appearance.  Cardiovascular:     Rate and Rhythm: Normal rate and regular rhythm.     Pulses: Normal pulses.     Heart sounds: Normal heart sounds.  Pulmonary:     Effort: Pulmonary effort is normal.     Breath sounds: Normal breath sounds.  Abdominal:     Palpations:  Abdomen is soft. There is no hepatomegaly, splenomegaly or mass.     Tenderness: There is no abdominal tenderness.  Musculoskeletal:     Right lower leg: No edema.     Left lower leg: No edema.  Lymphadenopathy:     Cervical: No cervical adenopathy.     Right cervical: No superficial cervical adenopathy.    Left cervical: No superficial cervical adenopathy.     Upper Body:     Right upper body: No supraclavicular, axillary or pectoral adenopathy.     Left upper body: No supraclavicular, axillary or pectoral adenopathy.     Lower Body: No right inguinal adenopathy. No left inguinal adenopathy.  Neurological:     General: No focal deficit present.     Mental Status: She is alert and oriented to person, place, and time.  Psychiatric:        Mood and Affect: Mood normal.        Behavior: Behavior normal.     LABORATORY DATA:  I have reviewed the data as listed Recent Results (from the past 2160 hour(s))  Basic metabolic panel     Status: Abnormal   Collection Time: 10/31/20  4:33 PM  Result Value Ref Range   Sodium 138 135 - 145 mmol/L   Potassium 3.2 (L) 3.5 - 5.1 mmol/L   Chloride 101 98 - 111 mmol/L   CO2 30 22 - 32 mmol/L   Glucose, Bld 115 (H) 70 - 99 mg/dL    Comment: Glucose reference range applies only to samples taken after fasting for at least 8 hours.   BUN 14 6 - 20 mg/dL   Creatinine, Ser 0.09 0.44 - 1.00 mg/dL   Calcium 9.2 8.9 - 23.3 mg/dL   GFR, Estimated >00 >76 mL/min    Comment: (NOTE) Calculated using the CKD-EPI Creatinine Equation (2021)    Anion gap 7 5 - 15    Comment: Performed at [facility], [address] [place], [place] [address]  CBC     Status: Abnormal   Collection Time: 10/31/20  4:33 PM  Result Value Ref Range   WBC 16.9 (H) 4.0 - 10.5 K/uL   RBC 4.79 3.87 - 5.11 MIL/uL   Hemoglobin 14.2 12.0 - 15.0 g/dL   HCT 35.4 56.2 - 56.3 %   MCV 89.6 80.0 - 100.0 fL   MCH 29.6 26.0 - 34.0 pg   MCHC 33.1 30.0 - 36.0 g/dL   RDW 89.3 73.4 - 28.7  %   Platelets 270 150 - 400 K/uL   nRBC 0.0 0.0 - 0.2 %    Comment: Performed at [facility], [address] [place], [place] [address]  Lipase, blood     Status: None   Collection Time: 10/31/20  4:33 PM  Result Value  Ref Range   Lipase 28 11 - 51 U/L    Comment: Performed at [facility], [address] [place], [place] [address]  Hepatic function panel     Status: Abnormal   Collection Time: 10/31/20  4:33 PM  Result Value Ref Range   Total Protein 8.6 (H) 6.5 - 8.1 g/dL   Albumin 4.1 3.5 - 5.0 g/dL   AST 19 15 - 41 U/L   ALT 18 0 - 44 U/L   Alkaline Phosphatase 87 38 - 126 U/L   Total Bilirubin 0.4 0.3 - 1.2 mg/dL   Bilirubin, Direct <6.2 0.0 - 0.2 mg/dL   Indirect Bilirubin NOT CALCULATED 0.3 - 0.9 mg/dL    Comment: Performed at [facility], [address] [place], [place] [address]  Urinalysis, Routine w reflex microscopic Urine, Clean Catch     Status: Abnormal   Collection Time: 10/31/20  5:38 PM  Result Value Ref Range   Color, Urine AMBER (A) YELLOW    Comment: BIOCHEMICALS MAY BE AFFECTED BY COLOR   APPearance CLEAR CLEAR   Specific Gravity, Urine 1.020 1.005 - 1.030   pH 6.0 5.0 - 8.0   Glucose, UA NEGATIVE NEGATIVE mg/dL   Hgb urine dipstick NEGATIVE NEGATIVE   Bilirubin Urine NEGATIVE NEGATIVE   Ketones, ur NEGATIVE NEGATIVE mg/dL   Protein, ur NEGATIVE NEGATIVE mg/dL   Nitrite POSITIVE (A) NEGATIVE   Leukocytes,Ua NEGATIVE NEGATIVE   RBC / HPF 0-5 0 - 5 RBC/hpf   WBC, UA 0-5 0 - 5 WBC/hpf   Bacteria, UA RARE (A) NONE SEEN   Squamous Epithelial / LPF 0-5 0 - 5   Mucus PRESENT    Hyaline Casts, UA PRESENT     Comment: Performed at [facility], [address], [place], [place] [address]  Urine Culture     Status: Abnormal   Collection Time: 10/31/20  5:38 PM   Specimen: Urine, Clean Catch  Result Value Ref Range   Specimen Description      URINE, CLEAN CATCH Performed at [facility], [address] [place], [place] [address]    Special Requests       NONE Performed at [facility], [address] [place], [place] [address]    Culture (A)     <10,000 COLONIES/mL INSIGNIFICANT GROWTH Performed at [facility], [address], [place], [place] [address]    Report Status 11/02/2020 FINAL   Blood culture (routine x 2)     Status: None   Collection Time: 10/31/20  7:39 PM   Specimen: BLOOD LEFT WRIST  Result Value Ref Range   Specimen Description BLOOD LEFT WRIST    Special Requests      BOTTLES DRAWN AEROBIC AND ANAEROBIC Blood Culture adequate volume   Culture      NO GROWTH 5 DAYS Performed at [facility], [address] [place], [place] [address]    Report Status 11/05/2020 FINAL   Lactic acid, plasma     Status: None   Collection Time: 10/31/20  7:47 PM  Result Value Ref Range   Lactic Acid, Venous 1.5 0.5 - 1.9 mmol/L    Comment: Performed at [facility], [address], [place], [place] [address]  Blood culture (routine x 2)     Status: None   Collection Time: 10/31/20  7:47 PM   Specimen: BLOOD RIGHT ARM  Result Value Ref Range   Specimen Description BLOOD RIGHT ARM    Special Requests  BOTTLES DRAWN AEROBIC AND ANAEROBIC Blood Culture adequate volume   Culture      NO GROWTH 5 DAYS Performed at [facility], [address], [place], [place] [address]    Report Status 11/05/2020 FINAL   Comprehensive metabolic panel     Status: Abnormal   Collection Time: 11/23/20 10:04 AM  Result Value Ref Range   Sodium 136 135 - 145 mmol/L   Potassium 3.2 (L) 3.5 - 5.1 mmol/L   Chloride 103 98 - 111 mmol/L   CO2 23 22 - 32 mmol/L   Glucose, Bld 137 (H) 70 - 99 mg/dL    Comment: Glucose reference range applies only to samples taken after fasting for at least 8 hours.   BUN 18 6 - 20 mg/dL   Creatinine, Ser 6.04 (H) 0.44 - 1.00 mg/dL   Calcium 8.9 8.9 - 54.0 mg/dL   Total Protein 8.4 (H) 6.5 - 8.1 g/dL   Albumin 4.0 3.5 - 5.0 g/dL   AST 26 15 - 41 U/L   ALT 19 0 - 44 U/L   Alkaline Phosphatase 77 38 - 126 U/L    Total Bilirubin 0.6 0.3 - 1.2 mg/dL   GFR, Estimated >98 >11 mL/min    Comment: (NOTE) Calculated using the CKD-EPI Creatinine Equation (2021)    Anion gap 10 5 - 15    Comment: Performed at [facility], [address] [place], [place] [address]  CBC with Differential     Status: Abnormal   Collection Time: 11/23/20 10:04 AM  Result Value Ref Range   WBC 14.6 (H) 4.0 - 10.5 K/uL   RBC 4.32 3.87 - 5.11 MIL/uL   Hemoglobin 12.9 12.0 - 15.0 g/dL   HCT 29.5 62.1 - 30.8 %   MCV 88.7 80.0 - 100.0 fL   MCH 29.9 26.0 - 34.0 pg   MCHC 33.7 30.0 - 36.0 g/dL   RDW 65.7 84.6 - 96.2 %   Platelets 277 150 - 400 K/uL   nRBC 0.0 0.0 - 0.2 %   Neutrophils Relative % 80 %   Neutro Abs 11.8 (H) 1.7 - 7.7 K/uL   Lymphocytes Relative 9 %   Lymphs Abs 1.3 0.7 - 4.0 K/uL   Monocytes Relative 9 %   Monocytes Absolute 1.3 (H) 0.1 - 1.0 K/uL   Eosinophils Relative 1 %   Eosinophils Absolute 0.2 0.0 - 0.5 K/uL   Basophils Relative 1 %   Basophils Absolute 0.1 0.0 - 0.1 K/uL   Immature Granulocytes 0 %   Abs Immature Granulocytes 0.04 0.00 - 0.07 K/uL    Comment: Performed at [facility], [address] [place], [place] [address]  Urinalysis, Routine w reflex microscopic     Status: Abnormal   Collection Time: 11/23/20 10:04 AM  Result Value Ref Range   Color, Urine YELLOW YELLOW   APPearance CLEAR CLEAR   Specific Gravity, Urine 1.025 1.005 - 1.030   pH 5.5 5.0 - 8.0   Glucose, UA NEGATIVE NEGATIVE mg/dL   Hgb urine dipstick TRACE (A) NEGATIVE   Bilirubin Urine SMALL (A) NEGATIVE   Ketones, ur 15 (A) NEGATIVE mg/dL   Protein, ur TRACE (A) NEGATIVE mg/dL   Nitrite NEGATIVE NEGATIVE   Leukocytes,Ua SMALL (A) NEGATIVE    Comment: Performed at [facility], [address] [place], [place] [address]  C Difficile Quick Screen w PCR reflex     Status: Abnormal   Collection Time: 11/23/20 10:04 AM   Specimen: STOOL  Result  Value Ref Range   C Diff antigen POSITIVE (A) NEGATIVE   C Diff toxin  POSITIVE (A) NEGATIVE   C Diff interpretation Toxin producing C. difficile detected.     Comment: CRITICAL RESULT CALLED TO, READ BACK BY AND VERIFIED WITH: DEVOLT, T AT 1113 ON 9.20.22 BY RUCINSKI,B Performed at [facility], [address] [place], [place] [address]   Urinalysis, Microscopic (reflex)     Status: None   Collection Time: 11/23/20 10:04 AM  Result Value Ref Range   RBC / HPF 0-5 0 - 5 RBC/hpf   WBC, UA 0-5 0 - 5 WBC/hpf   Bacteria, UA NONE SEEN NONE SEEN   Squamous Epithelial / LPF 0-5 0 - 5    Comment: Performed at [facility], [address] [place], [place] [address]  Magnesium     Status: None   Collection Time: 11/23/20 10:04 AM  Result Value Ref Range   Magnesium 1.8 1.7 - 2.4 mg/dL    Comment: Performed at [facility], [address], [place], [place] [address]  Resp Panel by RT-PCR (Flu A&B, Covid) Nasopharyngeal Swab     Status: None   Collection Time: 11/23/20  3:25 PM   Specimen: Nasopharyngeal Swab; Nasopharyngeal(NP) swabs in vial transport medium  Result Value Ref Range   SARS Coronavirus 2 by RT PCR NEGATIVE NEGATIVE    Comment: (NOTE) SARS-CoV-2 target nucleic acids are NOT DETECTED.  The SARS-CoV-2 RNA is generally detectable in upper respiratory specimens during the acute phase of infection. The lowest concentration of SARS-CoV-2 viral copies this assay can detect is 138 copies/mL. A negative result does not preclude SARS-Cov-2 infection and should not be used as the sole basis for treatment or other patient management decisions. A negative result may occur with  improper specimen collection/handling, submission of specimen other than nasopharyngeal swab, presence of viral mutation(s) within the areas targeted by this assay, and inadequate number of viral copies(<138 copies/mL). A negative result must be combined with clinical observations, patient history, and epidemiological information. The expected result is Negative.  Fact Sheet for  Patients:  BloggerCourse.com  Fact Sheet for Healthcare Providers:  SeriousBroker.it  This test is no t yet approved or cleared by the [place] FDA and  has been authorized for detection and/or diagnosis of SARS-CoV-2 by FDA under an Emergency Use Authorization (EUA). This EUA will remain  in effect (meaning this test can be used) for the duration of the COVID-19 declaration under Section 564(b)(1) of the Act, 21 U.S.C.section 360bbb-3(b)(1), unless the authorization is terminated  or revoked sooner.       Influenza A by PCR NEGATIVE NEGATIVE   Influenza B by PCR NEGATIVE NEGATIVE    Comment: (NOTE) The Xpert Xpress SARS-CoV-2/FLU/RSV plus assay is intended as an aid in the diagnosis of influenza from Nasopharyngeal swab specimens and should not be used as a sole basis for treatment. Nasal washings and aspirates are unacceptable for Xpert Xpress SARS-CoV-2/FLU/RSV testing.  Fact Sheet for Patients: BloggerCourse.com  Fact Sheet for Healthcare Providers: SeriousBroker.it  This test is not yet approved or cleared by the [place] FDA and has been authorized for detection and/or diagnosis of SARS-CoV-2 by FDA under an Emergency Use Authorization (EUA). This EUA will remain in effect (meaning this test can be used) for the duration of the COVID-19 declaration under Section 564(b)(1) of the Act, 21 U.S.C. section 360bbb-3(b)(1), unless the authorization is terminated or revoked.  Performed at [facility], [address], [place], [place]  [address]   Hemoglobin A1c     Status: Abnormal   Collection Time: 11/23/20  4:50 PM  Result Value Ref Range   Hgb A1c MFr Bld 6.3 (H) 4.8 - 5.6 %    Comment: (NOTE) Pre diabetes:          5.7%-6.4%  Diabetes:              >6.4%  Glycemic control for   <7.0% adults with diabetes    Mean Plasma Glucose 134.11 mg/dL    Comment:  Performed at [facility], [address] [place], [place] [address]  Lactic acid, plasma     Status: None   Collection Time: 11/23/20  5:00 PM  Result Value Ref Range   Lactic Acid, Venous 1.8 0.5 - 1.9 mmol/L    Comment: Performed at [facility], [address], [place], [place] [address]  Culture, blood (routine x 2)     Status: None   Collection Time: 11/23/20  5:00 PM   Specimen: BLOOD RIGHT HAND  Result Value Ref Range   Specimen Description BLOOD RIGHT HAND    Special Requests      BOTTLES DRAWN AEROBIC AND ANAEROBIC Blood Culture adequate volume   Culture      NO GROWTH 5 DAYS Performed at [facility], [address], [place], [place] [address]    Report Status 11/28/2020 FINAL   Culture, blood (routine x 2)     Status: None   Collection Time: 11/23/20  5:00 PM   Specimen: BLOOD LEFT FOREARM  Result Value Ref Range   Specimen Description BLOOD LEFT FOREARM    Special Requests      BOTTLES DRAWN AEROBIC AND ANAEROBIC Blood Culture results may not be optimal due to an inadequate volume of blood received in culture bottles   Culture      NO GROWTH 5 DAYS Performed at [facility], [address], [place], [place] [address]    Report Status 11/28/2020 FINAL   Glucose, capillary     Status: None   Collection Time: 11/23/20  8:34 PM  Result Value Ref Range   Glucose-Capillary 81 70 - 99 mg/dL    Comment: Glucose reference range applies only to samples taken after fasting for at least 8 hours.  Glucose, capillary     Status: None   Collection Time: 11/24/20  5:20 AM  Result Value Ref Range   Glucose-Capillary 83 70 - 99 mg/dL    Comment: Glucose reference range applies only to samples taken after fasting for at least 8 hours.  HIV Antibody (routine testing w rflx)     Status: None   Collection Time: 11/24/20  5:23 AM  Result Value Ref Range   HIV Screen 4th Generation wRfx Non Reactive Non Reactive    Comment: Performed at [facility], [address],  [place], [place] [address]  Basic metabolic panel     Status: Abnormal   Collection Time: 11/24/20  5:23 AM  Result Value Ref Range   Sodium 140 135 - 145 mmol/L   Potassium 4.1 3.5 - 5.1 mmol/L    Comment: DELTA CHECK NOTED   Chloride 112 (H) 98 - 111 mmol/L   CO2 23 22 - 32 mmol/L   Glucose, Bld 88 70 - 99 mg/dL    Comment: Glucose reference range applies only to samples taken after fasting for at least 8 hours.   BUN 9 6 - 20 mg/dL   Creatinine, Ser 6.78  0.44 - 1.00 mg/dL   Calcium 8.3 (L) 8.9 - 10.3 mg/dL   GFR, Estimated >16 >10 mL/min    Comment: (NOTE) Calculated using the CKD-EPI Creatinine Equation (2021)    Anion gap 5 5 - 15    Comment: Performed at [facility], [address], [place], [place] [address]  CBC     Status: Abnormal   Collection Time: 11/24/20  5:23 AM  Result Value Ref Range   WBC 3.4 (L) 4.0 - 10.5 K/uL   RBC 3.34 (L) 3.87 - 5.11 MIL/uL   Hemoglobin 9.7 (L) 12.0 - 15.0 g/dL   HCT 40.9 (L) 81.1 - 91.4 %   MCV 92.8 80.0 - 100.0 fL   MCH 29.0 26.0 - 34.0 pg   MCHC 31.3 30.0 - 36.0 g/dL   RDW 78.2 95.6 - 21.3 %   Platelets 147 (L) 150 - 400 K/uL   nRBC 0.0 0.0 - 0.2 %    Comment: Performed at [facility], [address] [place], [place] [address]  Glucose, capillary     Status: None   Collection Time: 11/24/20  7:28 AM  Result Value Ref Range   Glucose-Capillary 76 70 - 99 mg/dL    Comment: Glucose reference range applies only to samples taken after fasting for at least 8 hours.  Glucose, capillary     Status: None   Collection Time: 11/24/20  2:21 PM  Result Value Ref Range   Glucose-Capillary 90 70 - 99 mg/dL    Comment: Glucose reference range applies only to samples taken after fasting for at least 8 hours.  Glucose, capillary     Status: None   Collection Time: 11/24/20  9:21 PM  Result Value Ref Range   Glucose-Capillary 99 70 - 99 mg/dL    Comment: Glucose reference range applies only to samples taken after fasting for at least 8 hours.   Glucose, capillary     Status: None   Collection Time: 11/25/20  5:17 AM  Result Value Ref Range   Glucose-Capillary 93 70 - 99 mg/dL    Comment: Glucose reference range applies only to samples taken after fasting for at least 8 hours.  CBC Once     Status: Abnormal   Collection Time: 11/25/20  5:45 AM  Result Value Ref Range   WBC 3.7 (L) 4.0 - 10.5 K/uL   RBC 3.39 (L) 3.87 - 5.11 MIL/uL   Hemoglobin 10.3 (L) 12.0 - 15.0 g/dL   HCT 84.6 (L) 96.2 - 95.2 %   MCV 94.4 80.0 - 100.0 fL   MCH 30.4 26.0 - 34.0 pg   MCHC 32.2 30.0 - 36.0 g/dL   RDW 84.1 32.4 - 40.1 %   Platelets 131 (L) 150 - 400 K/uL   nRBC 0.0 0.0 - 0.2 %    Comment: Performed at [facility], [address] [place], [place] [address]  Basic metabolic panel Once     Status: Abnormal   Collection Time: 11/25/20  5:45 AM  Result Value Ref Range   Sodium 140 135 - 145 mmol/L   Potassium 3.9 3.5 - 5.1 mmol/L   Chloride 111 98 - 111 mmol/L   CO2 22 22 - 32 mmol/L   Glucose, Bld 95 70 - 99 mg/dL    Comment: Glucose reference range applies only to samples taken after fasting for at least 8 hours.   BUN 7 6 - 20 mg/dL   Creatinine, Ser 3.66 0.44 - 1.00 mg/dL   Calcium  8.5 (L) 8.9 - 10.3 mg/dL   GFR, Estimated >19 >14 mL/min    Comment: (NOTE) Calculated using the CKD-EPI Creatinine Equation (2021)    Anion gap 7 5 - 15    Comment: Performed at [facility], [address] [place], [place] [address]  Magnesium     Status: None   Collection Time: 11/25/20  5:45 AM  Result Value Ref Range   Magnesium 2.0 1.7 - 2.4 mg/dL    Comment: Performed at [facility], [address], [place], [place] [address]    RADIOGRAPHIC STUDIES: I have personally reviewed the radiological images as listed and agreed with the findings in the report. CT Abdomen Pelvis W Contrast  Result Date: 11/23/2020 CLINICAL DATA:  Infectious gastroenteritis or colitis EXAM: CT ABDOMEN AND PELVIS WITH CONTRAST TECHNIQUE: Multidetector CT imaging of the  abdomen and pelvis was performed using the standard protocol following bolus administration of intravenous contrast. CONTRAST:  80mL OMNIPAQUE IOHEXOL 350 MG/ML SOLN COMPARISON:  10/31/2020 FINDINGS: Lower chest: Scattered small calcified granulomas within the lung bases. Lung bases are otherwise clear. Heart size is normal. Hepatobiliary: Mildly decreased attenuation of the hepatic parenchyma. No focal hepatic lesion is identified. Prior cholecystectomy. No intrahepatic biliary dilatation. Pancreas: Unremarkable. No pancreatic ductal dilatation or surrounding inflammatory changes. Spleen: Normal in size without focal abnormality. Adrenals/Urinary Tract: Unremarkable adrenal glands. Subcentimeter low-density lesions within the right kidney, too small to definitively characterize but most likely represent cysts. Kidneys appear otherwise unremarkable with symmetric enhancement. No renal stone or hydronephrosis. Urinary bladder appears unremarkable for the degree of distension. Stomach/Bowel: Small hiatal hernia. Stomach otherwise unremarkable. Oral contrast is present within the distal small bowel and colon. No dilated loops of bowel to suggest obstruction. There is mild diffuse wall thickening involving the colon. There are a few scattered left-sided diverticula. Vascular/Lymphatic: Scattered aortoiliac atherosclerotic calcifications without aneurysm. No abdominopelvic lymphadenopathy. Reproductive: Uterus and bilateral adnexa are unremarkable. Other: No free fluid. No abdominopelvic fluid collection. No pneumoperitoneum. No abdominal wall hernia. Musculoskeletal: Degenerative disc disease at L5-S1. No acute osseous abnormality. Soft tissues appear within normal limits. IMPRESSION: 1. Mild diffuse wall thickening involving the colon suggestive of a nonspecific infectious or inflammatory colitis. 2. Mild left-sided colonic diverticulosis without evidence to suggest an acute diverticulitis. 3. Small hiatal hernia. 4.  Hepatic steatosis. 5. Aortic atherosclerosis (ICD10-I70.0). Electronically Signed   By: Duanne Guess D.O.   On: 11/23/2020 13:41    ASSESSMENT:  Pancytopenia: - She reports UTI started on July 6 of this year, required 3 different antibiotics. - She was diagnosed with C. difficile colitis on 11/23/2020 in the ER and was admitted to the hospital for couple of days.  She was treated with 10 days of p.o. vancomycin. - CBC on presentation showed white count 14.6, with elevated neutrophils and monocytes.  Platelet count was normal.  CBC on 11/24/2020 with white count 3.4 and platelet count 147 and hemoglobin 9.7.  CBC on 11/25/2020 with white count 3.7 hemoglobin 10.3 and platelet count 131. - CTAP on 11/23/2020 with colitis, diverticulitis, hepatic steatosis.  Spleen was normal. - She was once told that she was anemic when she was 17.  She has easy bruising all her life.  She never required blood transfusion.  No recent blood donation.  2.  Social/family history: - Lives at home with her husband.  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  No history of smoking. - Father had prostate cancer.   PLAN:  Pancytopenia: - I have reviewed  her CBC over the last several years.  Leukopenia and thrombocytopenia most likely transient from C. difficile infection and previous antibiotic use.  She completed p.o. vancomycin 2 weeks ago. - We will repeat her CBC with differential today and check LDH, reticulocyte count, B12, folic acid and methylmalonic acid levels. - We will schedule her for a phone visit in 1 week. - If she still has persistent pancytopenia, we will do additional work-up.   All questions were answered. The patient knows to call the clinic with any problems, questions or concerns.  Doreatha Massed, MD 12/09/20 6:49 PM  [facility] [phone]   I, Alda Ponder, am acting as a scribe for Dr. Doreatha Massed.  I, Doreatha Massed MD, have reviewed the above  documentation for accuracy and completeness, and I agree with the above.

## 2020-12-10 ENCOUNTER — Other Ambulatory Visit: Payer: Self-pay

## 2020-12-10 ENCOUNTER — Inpatient Hospital Stay (HOSPITAL_COMMUNITY): Payer: BC Managed Care – PPO

## 2020-12-10 ENCOUNTER — Inpatient Hospital Stay (HOSPITAL_COMMUNITY): Payer: BC Managed Care – PPO | Attending: Hematology | Admitting: Hematology

## 2020-12-10 VITALS — BP 159/91 | HR 76 | Temp 98.4°F | Resp 16 | Wt 152.8 lb

## 2020-12-10 DIAGNOSIS — Z79899 Other long term (current) drug therapy: Secondary | ICD-10-CM | POA: Insufficient documentation

## 2020-12-10 DIAGNOSIS — Z8042 Family history of malignant neoplasm of prostate: Secondary | ICD-10-CM | POA: Diagnosis not present

## 2020-12-10 DIAGNOSIS — D61818 Other pancytopenia: Secondary | ICD-10-CM | POA: Diagnosis not present

## 2020-12-10 LAB — CBC WITH DIFFERENTIAL/PLATELET
Abs Immature Granulocytes: 0.05 10*3/uL (ref 0.00–0.07)
Basophils Absolute: 0.1 10*3/uL (ref 0.0–0.1)
Basophils Relative: 1 %
Eosinophils Absolute: 0.2 10*3/uL (ref 0.0–0.5)
Eosinophils Relative: 3 %
HCT: 41.1 % (ref 36.0–46.0)
Hemoglobin: 13.1 g/dL (ref 12.0–15.0)
Immature Granulocytes: 1 %
Lymphocytes Relative: 34 %
Lymphs Abs: 2.2 10*3/uL (ref 0.7–4.0)
MCH: 28.8 pg (ref 26.0–34.0)
MCHC: 31.9 g/dL (ref 30.0–36.0)
MCV: 90.3 fL (ref 80.0–100.0)
Monocytes Absolute: 0.5 10*3/uL (ref 0.1–1.0)
Monocytes Relative: 8 %
Neutro Abs: 3.5 10*3/uL (ref 1.7–7.7)
Neutrophils Relative %: 53 %
Platelets: 284 10*3/uL (ref 150–400)
RBC: 4.55 MIL/uL (ref 3.87–5.11)
RDW: 12.6 % (ref 11.5–15.5)
WBC: 6.5 10*3/uL (ref 4.0–10.5)
nRBC: 0 % (ref 0.0–0.2)

## 2020-12-10 LAB — VITAMIN B12: Vitamin B-12: 382 pg/mL (ref 180–914)

## 2020-12-10 LAB — LACTATE DEHYDROGENASE: LDH: 150 U/L (ref 98–192)

## 2020-12-10 LAB — RETICULOCYTES
Immature Retic Fract: 9 % (ref 2.3–15.9)
RBC.: 4.47 MIL/uL (ref 3.87–5.11)
Retic Count, Absolute: 51 10*3/uL (ref 19.0–186.0)
Retic Ct Pct: 1.1 % (ref 0.4–3.1)

## 2020-12-10 LAB — FOLATE: Folate: 8.5 ng/mL (ref >5.9)

## 2020-12-10 NOTE — Patient Instructions (Addendum)
[facility] at [facility] Discharge Instructions  You were seen and examined today by Dr. Ellin Saba. Dr. Ellin Saba is a hematologist, meaning he specializes in the management of blood disorders. Dr. Ellin Saba discussed your past medical history, family history of cancers/blood disorders, and the events that led to you being here today.  You were referred to Dr. Ellin Saba due to anemia, including decreased hemoglobin, white blood cells and platelets. Dr. Ellin Saba has recommended additional lab work today in an attempt to identify the cause of your anemia.  There is a very high likelihood that this is a very temporary reaction related to your recent infection.  Follow-up as scheduled.   Thank you for choosing [facility] at [facility] to provide your oncology and hematology care.  To afford each patient quality time with our provider, please arrive at least 15 minutes before your scheduled appointment time.   If you have a lab appointment with the Cancer Center please come in thru the Main Entrance and check in at the main information desk.  You need to re-schedule your appointment should you arrive 10 or more minutes late.  We strive to give you quality time with our providers, and arriving late affects you and other patients whose appointments are after yours.  Also, if you no show three or more times for appointments you may be dismissed from the clinic at the providers discretion.     Again, thank you for choosing [facility]  Our hope is that these requests will decrease the amount of time that you wait before being seen by our physicians.       _____________________________________________________________  Should you have questions after your visit to [facility], please contact our office at [phone] and follow the prompts.  Our office hours are 8:00 a.m. and 4:30 p.m. Monday - Friday.  Please note that  voicemails left after 4:00 p.m. may not be returned until the following business day.  We are closed weekends and major holidays.  You do have access to a nurse 24-7, just call the main number to the clinic [phone] and do not press any options, hold on the line and a nurse will answer the phone.    For prescription refill requests, have your pharmacy contact our office and allow 72 hours.    Due to Covid, you will need to wear a mask upon entering the hospital. If you do not have a mask, a mask will be given to you at the Main Entrance upon arrival. For doctor visits, patients may have 1 support person age 59 or older with them. For treatment visits, patients can not have anyone with them due to social distancing guidelines and our immunocompromised population.

## 2020-12-13 ENCOUNTER — Other Ambulatory Visit: Payer: Self-pay

## 2020-12-13 ENCOUNTER — Encounter (HOSPITAL_COMMUNITY): Payer: Self-pay | Admitting: *Deleted

## 2020-12-13 ENCOUNTER — Observation Stay (HOSPITAL_COMMUNITY)
Admission: EM | Admit: 2020-12-13 | Discharge: 2020-12-14 | Disposition: A | Discharge status: Home / Self Care | Payer: BC Managed Care – PPO | Attending: Family Medicine | Admitting: Family Medicine

## 2020-12-13 ENCOUNTER — Emergency Department (HOSPITAL_COMMUNITY): Payer: BC Managed Care – PPO

## 2020-12-13 DIAGNOSIS — Z20822 Contact with and (suspected) exposure to covid-19: Secondary | ICD-10-CM | POA: Diagnosis not present

## 2020-12-13 DIAGNOSIS — Z79899 Other long term (current) drug therapy: Secondary | ICD-10-CM | POA: Insufficient documentation

## 2020-12-13 DIAGNOSIS — R1032 Left lower quadrant pain: Secondary | ICD-10-CM | POA: Diagnosis present

## 2020-12-13 DIAGNOSIS — E119 Type 2 diabetes mellitus without complications: Secondary | ICD-10-CM | POA: Diagnosis not present

## 2020-12-13 DIAGNOSIS — R197 Diarrhea, unspecified: Secondary | ICD-10-CM | POA: Diagnosis not present

## 2020-12-13 DIAGNOSIS — R6510 Systemic inflammatory response syndrome (SIRS) of non-infectious origin without acute organ dysfunction: Secondary | ICD-10-CM | POA: Diagnosis not present

## 2020-12-13 DIAGNOSIS — E876 Hypokalemia: Secondary | ICD-10-CM | POA: Diagnosis not present

## 2020-12-13 DIAGNOSIS — I10 Essential (primary) hypertension: Secondary | ICD-10-CM | POA: Insufficient documentation

## 2020-12-13 DIAGNOSIS — A0472 Enterocolitis due to Clostridium difficile, not specified as recurrent: Principal | ICD-10-CM | POA: Insufficient documentation

## 2020-12-13 HISTORY — DX: Other bacterial infections of unspecified site: A49.8

## 2020-12-13 LAB — CBC WITH DIFFERENTIAL/PLATELET
Abs Immature Granulocytes: 0.06 10*3/uL (ref 0.00–0.07)
Basophils Absolute: 0.1 10*3/uL (ref 0.0–0.1)
Basophils Relative: 1 %
Eosinophils Absolute: 0.2 10*3/uL (ref 0.0–0.5)
Eosinophils Relative: 1 %
HCT: 41.1 % (ref 36.0–46.0)
Hemoglobin: 13.6 g/dL (ref 12.0–15.0)
Immature Granulocytes: 1 %
Lymphocytes Relative: 18 %
Lymphs Abs: 2.3 10*3/uL (ref 0.7–4.0)
MCH: 28.9 pg (ref 26.0–34.0)
MCHC: 33.1 g/dL (ref 30.0–36.0)
MCV: 87.3 fL (ref 80.0–100.0)
Monocytes Absolute: 1.5 10*3/uL — ABNORMAL HIGH (ref 0.1–1.0)
Monocytes Relative: 12 %
Neutro Abs: 8.6 10*3/uL — ABNORMAL HIGH (ref 1.7–7.7)
Neutrophils Relative %: 67 %
Platelets: 281 10*3/uL (ref 150–400)
RBC: 4.71 MIL/uL (ref 3.87–5.11)
RDW: 12.7 % (ref 11.5–15.5)
WBC: 12.7 10*3/uL — ABNORMAL HIGH (ref 4.0–10.5)
nRBC: 0 % (ref 0.0–0.2)

## 2020-12-13 LAB — URINALYSIS, ROUTINE W REFLEX MICROSCOPIC
Bilirubin Urine: NEGATIVE
Glucose, UA: NEGATIVE mg/dL
Hgb urine dipstick: NEGATIVE
Ketones, ur: NEGATIVE mg/dL
Leukocytes,Ua: NEGATIVE
Nitrite: NEGATIVE
Protein, ur: NEGATIVE mg/dL
Specific Gravity, Urine: <1.005 — ABNORMAL LOW (ref 1.005–1.030)
pH: 5.5 (ref 5.0–8.0)

## 2020-12-13 LAB — C DIFFICILE QUICK SCREEN W PCR REFLEX
C Diff antigen: POSITIVE — AB
C Diff toxin: NEGATIVE

## 2020-12-13 LAB — COMPREHENSIVE METABOLIC PANEL
ALT: 18 U/L (ref 0–44)
AST: 21 U/L (ref 15–41)
Albumin: 4 g/dL (ref 3.5–5.0)
Alkaline Phosphatase: 82 U/L (ref 38–126)
Anion gap: 9 (ref 5–15)
BUN: 14 mg/dL (ref 6–20)
CO2: 27 mmol/L (ref 22–32)
Calcium: 9.1 mg/dL (ref 8.9–10.3)
Chloride: 104 mmol/L (ref 98–111)
Creatinine, Ser: 0.99 mg/dL (ref 0.44–1.00)
GFR, Estimated: >60 mL/min (ref >60)
Glucose, Bld: 128 mg/dL — ABNORMAL HIGH (ref 70–99)
Potassium: 2.9 mmol/L — ABNORMAL LOW (ref 3.5–5.1)
Sodium: 140 mmol/L (ref 135–145)
Total Bilirubin: 0.7 mg/dL (ref 0.3–1.2)
Total Protein: 8.2 g/dL — ABNORMAL HIGH (ref 6.5–8.1)

## 2020-12-13 LAB — GLUCOSE, CAPILLARY
Glucose-Capillary: 84 mg/dL (ref 70–99)
Glucose-Capillary: 79 mg/dL (ref 70–99)

## 2020-12-13 LAB — CLOSTRIDIUM DIFFICILE BY PCR, REFLEXED: Toxigenic C. Difficile by PCR: POSITIVE — AB

## 2020-12-13 LAB — LACTIC ACID, PLASMA
Lactic Acid, Venous: 2.6 mmol/L (ref 0.5–1.9)
Lactic Acid, Venous: 1.4 mmol/L (ref 0.5–1.9)

## 2020-12-13 LAB — RESP PANEL BY RT-PCR (FLU A&B, COVID) ARPGX2
Influenza A by PCR: NEGATIVE
Influenza B by PCR: NEGATIVE
SARS Coronavirus 2 by RT PCR: NEGATIVE

## 2020-12-13 LAB — METHYLMALONIC ACID, SERUM: Methylmalonic Acid, Quantitative: 157 nmol/L (ref 0–378)

## 2020-12-13 LAB — MAGNESIUM: Magnesium: 1.4 mg/dL — ABNORMAL LOW (ref 1.7–2.4)

## 2020-12-13 LAB — LIPASE, BLOOD: Lipase: 34 U/L (ref 11–51)

## 2020-12-13 MED ORDER — POTASSIUM CHLORIDE CRYS ER 20 MEQ PO TBCR
40.0000 meq | EXTENDED_RELEASE_TABLET | Freq: Once | ORAL | Status: AC
  Administered 2020-12-13: 40 meq via ORAL
  Filled 2020-12-13: qty 2

## 2020-12-13 MED ORDER — SODIUM CHLORIDE 0.9 % IV BOLUS
1000.0000 mL | Freq: Once | INTRAVENOUS | Status: AC
  Administered 2020-12-13: 1000 mL via INTRAVENOUS

## 2020-12-13 MED ORDER — ACETAMINOPHEN 325 MG PO TABS
650.0000 mg | ORAL_TABLET | Freq: Four times a day (QID) | ORAL | Status: DC | PRN
  Administered 2020-12-13 – 2020-12-14 (×2): 650 mg via ORAL
  Filled 2020-12-13 (×2): qty 2

## 2020-12-13 MED ORDER — MAGNESIUM SULFATE 2 GM/50ML IV SOLN
2.0000 g | Freq: Once | INTRAVENOUS | Status: AC
  Administered 2020-12-13: 2 g via INTRAVENOUS
  Filled 2020-12-13: qty 50

## 2020-12-13 MED ORDER — INSULIN ASPART 100 UNIT/ML IJ SOLN: 0.0000 [IU] | Freq: Every day | INTRAMUSCULAR | Status: DC

## 2020-12-13 MED ORDER — VANCOMYCIN HCL 125 MG PO CAPS: 125.0000 mg | ORAL_CAPSULE | ORAL | Status: DC

## 2020-12-13 MED ORDER — ONDANSETRON HCL 4 MG PO TABS
4.0000 mg | ORAL_TABLET | Freq: Four times a day (QID) | ORAL | Status: DC | PRN
  Administered 2020-12-13: 4 mg via ORAL
  Filled 2020-12-13: qty 1

## 2020-12-13 MED ORDER — TRAZODONE HCL 50 MG PO TABS
50.0000 mg | ORAL_TABLET | Freq: Every evening | ORAL | Status: DC | PRN
  Administered 2020-12-13: 50 mg via ORAL
  Filled 2020-12-13: qty 1

## 2020-12-13 MED ORDER — ONDANSETRON HCL 4 MG/2ML IJ SOLN: 4.0000 mg | Freq: Four times a day (QID) | INTRAMUSCULAR | Status: DC | PRN

## 2020-12-13 MED ORDER — ATORVASTATIN CALCIUM 10 MG PO TABS
10.0000 mg | ORAL_TABLET | Freq: Every day | ORAL | Status: DC
  Administered 2020-12-13: 10 mg via ORAL
  Filled 2020-12-13: qty 1

## 2020-12-13 MED ORDER — SODIUM CHLORIDE 0.9% FLUSH
3.0000 mL | Freq: Two times a day (BID) | INTRAVENOUS | Status: DC
  Administered 2020-12-13: 3 mL via INTRAVENOUS

## 2020-12-13 MED ORDER — VANCOMYCIN HCL 125 MG PO CAPS: 125.0000 mg | ORAL_CAPSULE | Freq: Every day | ORAL | Status: DC

## 2020-12-13 MED ORDER — IOHEXOL 300 MG/ML  SOLN
100.0000 mL | Freq: Once | INTRAMUSCULAR | Status: AC | PRN
  Administered 2020-12-13: 100 mL via INTRAVENOUS

## 2020-12-13 MED ORDER — VANCOMYCIN HCL 125 MG PO CAPS
125.0000 mg | ORAL_CAPSULE | Freq: Four times a day (QID) | ORAL | Status: DC
  Administered 2020-12-13 – 2020-12-14 (×4): 125 mg via ORAL
  Filled 2020-12-13 (×7): qty 1
  Filled 2020-12-13: qty 8
  Filled 2020-12-13 (×2): qty 1

## 2020-12-13 MED ORDER — ACETAMINOPHEN 650 MG RE SUPP: 650.0000 mg | Freq: Four times a day (QID) | RECTAL | Status: DC | PRN

## 2020-12-13 MED ORDER — LABETALOL HCL 5 MG/ML IV SOLN: 10.0000 mg | INTRAVENOUS | Status: DC | PRN

## 2020-12-13 MED ORDER — ACETAMINOPHEN 325 MG PO TABS: 650.0000 mg | ORAL_TABLET | Freq: Four times a day (QID) | ORAL | Status: DC | PRN

## 2020-12-13 MED ORDER — ADULT MULTIVITAMIN W/MINERALS CH
1.0000 | ORAL_TABLET | Freq: Every day | ORAL | Status: DC
  Filled 2020-12-13 (×2): qty 1

## 2020-12-13 MED ORDER — SODIUM CHLORIDE 0.9% FLUSH: 3.0000 mL | INTRAVENOUS | Status: DC | PRN

## 2020-12-13 MED ORDER — HEPARIN SODIUM (PORCINE) 5000 UNIT/ML IJ SOLN
5000.0000 [IU] | Freq: Three times a day (TID) | INTRAMUSCULAR | Status: DC
  Administered 2020-12-13 – 2020-12-14 (×3): 5000 [IU] via SUBCUTANEOUS
  Filled 2020-12-13 (×3): qty 1

## 2020-12-13 MED ORDER — HYDROCODONE-ACETAMINOPHEN 7.5-325 MG PO TABS: 1.0000 | ORAL_TABLET | Freq: Four times a day (QID) | ORAL | Status: DC | PRN

## 2020-12-13 MED ORDER — SODIUM CHLORIDE 0.9 % IV SOLN: 250.0000 mL | INTRAVENOUS | Status: DC | PRN

## 2020-12-13 MED ORDER — POTASSIUM CHLORIDE 10 MEQ/100ML IV SOLN
10.0000 meq | INTRAVENOUS | Status: AC
Start: 2020-12-13 — End: 2020-12-13
  Administered 2020-12-13 (×4): 10 meq via INTRAVENOUS
  Filled 2020-12-13: qty 100

## 2020-12-13 MED ORDER — CITALOPRAM HYDROBROMIDE 20 MG PO TABS
10.0000 mg | ORAL_TABLET | Freq: Every day | ORAL | Status: DC
  Filled 2020-12-13: qty 1

## 2020-12-13 MED ORDER — FENTANYL CITRATE PF 50 MCG/ML IJ SOSY: 25.0000 ug | PREFILLED_SYRINGE | INTRAMUSCULAR | Status: DC | PRN

## 2020-12-13 MED ORDER — INSULIN ASPART 100 UNIT/ML IJ SOLN: 0.0000 [IU] | Freq: Three times a day (TID) | INTRAMUSCULAR | Status: DC

## 2020-12-13 MED ORDER — SODIUM CHLORIDE 0.9 % IV SOLN: INTRAVENOUS | Status: DC

## 2020-12-13 MED ORDER — VANCOMYCIN HCL 125 MG PO CAPS: 125.0000 mg | ORAL_CAPSULE | Freq: Two times a day (BID) | ORAL | Status: DC

## 2020-12-13 NOTE — Consult Note (Signed)
Referring Provider: Dr. Charm Barges, [facility] Primary Care Physician:  Pollyann Savoy, NP Primary Gastroenterologist:  Dr. Marletta Lor (previously unassigned)   Date of Admission: 12/13/20 Date of Consultation: 12/14/20  Reason for Consultation:  Cdiff colitis   HPI:  Sheryl Lowe is a 60 y.o. year old female with a history of Cdiff colitis 11/23/20 requiring admission 9/20-9/22 at [facility]; she was treated with oral vancomycin and sent home to complete a 10 day course. Now presenting with recurrent diarrhea. Cdiff quick screen with positive antigen and negative toxin. PCR reflex is pending. Mild leukocytosis on admission of 12.7. Creatinine normal at 0.99. Notably hypokalemia with potassium 2.9. CT abd/pelvis with contrast today shows colonic wall thickening along much of colon but greater on the left. Possibility of diverticulitis raised.   Baseline BMs once per day prior to acute illness. States she had improvement in frequency of diarrhea as outpatient following last hospitalization but not completely back to baseline. Has had intermittent LLQ pain since prior episode. No pain today. Recurrent diarrhea starting last night. Last stool 2 hours ago watery. Malodorous. No rectal bleeding. No unexplained weight loss. No appetite. Chronic vomiting. Nauseated last night. Drank sprite before coming here and vomited. Able to tolerate a few sips of water recently. Chronic GERD. Takes omeprazole daily, which calms down symptoms. No dysphagia.   No prior colonoscopy.   Past Medical History:  Diagnosis Date   Anxiety    Clostridium difficile infection    Hyperlipidemia    Hypertension    Kidney stones    Migraine     Past Surgical History:  Procedure Laterality Date   CHOLECYSTECTOMY  2020    Prior to Admission medications   Medication Sig Start Date End Date Taking? Authorizing Provider  atorvastatin (LIPITOR) 10 MG tablet Take 1 tablet (10 mg total) by mouth daily. Need f/u visit for  additional refills 11/06/20  Yes Burns, Bobette Mo, MD  valsartan-hydrochlorothiazide (DIOVAN-HCT) 80-12.5 MG tablet Take 1 tablet by mouth daily. 08/16/20  Yes [provider]  citalopram (CELEXA) 10 MG tablet TAKE 1 TABLET BY MOUTH EVERY DAY Patient not taking: No sig reported 02/20/20   Pincus Sanes, MD  omeprazole (PRILOSEC) 20 MG capsule Take 1 capsule (20 mg total) by mouth daily. Patient not taking: Reported on 12/13/2020 03/26/15   Pincus Sanes, MD  rizatriptan (MAXALT) 10 MG tablet TAKE 1 TABLET BY MOUTH AS NEEDED FOR MIGRAINE**MAY REPEAT IN 2 HOURS IF NEEDED** Patient not taking: Reported on 12/13/2020 09/27/20   Pincus Sanes, MD  vancomycin (VANCOCIN) 125 MG capsule Take 1 capsule (125 mg total) by mouth 4 (four) times daily. Patient not taking: Reported on 12/13/2020 11/24/20   Maurilio Lovely D, DO    Current Facility-Administered Medications  Medication Dose Route Frequency Provider Last Rate Last Admin   0.9 %  sodium chloride infusion   Intravenous Continuous Emokpae, Courage, MD       vancomycin (VANCOCIN) capsule 125 mg  125 mg Oral QID Emokpae, Courage, MD       Followed by   Melene Muller ON 12/27/2020] vancomycin (VANCOCIN) capsule 125 mg  125 mg Oral BID Emokpae, Courage, MD       Followed by   Melene Muller ON 01/04/2021] vancomycin (VANCOCIN) capsule 125 mg  125 mg Oral Daily Emokpae, Courage, MD       Followed by   Melene Muller ON 01/11/2021] vancomycin (VANCOCIN) capsule 125 mg  125 mg Oral QODAY Shon Hale, MD  Followed by   Melene Muller ON 01/19/2021] vancomycin (VANCOCIN) capsule 125 mg  125 mg Oral Q3 days Shon Hale, MD       Current Outpatient Medications  Medication Sig Dispense Refill   atorvastatin (LIPITOR) 10 MG tablet Take 1 tablet (10 mg total) by mouth daily. Need f/u visit for additional refills 30 tablet 0   valsartan-hydrochlorothiazide (DIOVAN-HCT) 80-12.5 MG tablet Take 1 tablet by mouth daily.     citalopram (CELEXA) 10 MG tablet TAKE 1 TABLET BY  MOUTH EVERY DAY (Patient not taking: No sig reported) 90 tablet 2   omeprazole (PRILOSEC) 20 MG capsule Take 1 capsule (20 mg total) by mouth daily. (Patient not taking: Reported on 12/13/2020) 30 capsule 3   rizatriptan (MAXALT) 10 MG tablet TAKE 1 TABLET BY MOUTH AS NEEDED FOR MIGRAINE**MAY REPEAT IN 2 HOURS IF NEEDED** (Patient not taking: Reported on 12/13/2020) 9 tablet 0   vancomycin (VANCOCIN) 125 MG capsule Take 1 capsule (125 mg total) by mouth 4 (four) times daily. (Patient not taking: Reported on 12/13/2020) 34 capsule 0    Allergies as of 12/13/2020 - Review Complete 12/13/2020  Allergen Reaction Noted   Amitriptyline  10/18/2015   Topamax [topiramate]  10/18/2015    Family History  Problem Relation Age of Onset   Stroke Mother    Heart disease Mother    Hypertension Father    Cancer Father    Colon cancer Neg Hx    Colon polyps Neg Hx     Social History   Socioeconomic History   Marital status: Married    Spouse name: Not on file   Number of children: Not on file   Years of education: Not on file   Highest education level: Not on file  Occupational History   Not on file  Tobacco Use   Smoking status: Never   Smokeless tobacco: Never  Vaping Use   Vaping Use: Never used  Substance and Sexual Activity   Alcohol use: Not Currently   Drug use: No   Sexual activity: Not on file  Other Topics Concern   Not on file  Social History Narrative   Not on file   Social Determinants of Health   Financial Resource Strain: Not on file  Food Insecurity: Not on file  Transportation Needs: Not on file  Physical Activity: Not on file  Stress: Not on file  Social Connections: Not on file  Intimate Partner Violence: Not on file    Review of Systems: Gen: Denies fever, chills, loss of appetite, change in weight or weight loss CV: Denies chest pain, heart palpitations, syncope, edema  Resp: Denies shortness of breath with rest, cough, wheezing GI: see HPI GU :  Denies urinary burning, urinary frequency, urinary incontinence.  MS: Denies joint pain,swelling, cramping Derm: Denies rash, itching, dry skin Psych: Denies depression, anxiety,confusion, or memory loss Heme: Denies bruising, bleeding, and enlarged lymph nodes.  Physical Exam: Vital signs in last 24 hours: Temp:  [99.5 F (37.5 C)] 99.5 F (37.5 C) (10/10 0736) Pulse Rate:  [113-140] 122 (10/10 1300) Resp:  [14-24] 16 (10/10 1300) BP: (114-137)/(81-96) 132/84 (10/10 1300) SpO2:  [92 %-99 %] 92 % (10/10 1300) Weight:  [69.3 kg] 69.3 kg (10/10 0732)   General:   Alert,  Well-developed, well-nourished, pleasant and cooperative in NAD Head:  Normocephalic and atraumatic. Eyes:  Sclera clear, no icterus.   Conjunctiva pink. Ears:  Normal auditory acuity. Lungs:  Clear throughout to auscultation.   No wheezes, crackles,  or rhonchi. No acute distress. Heart:  Regular rate and rhythm; no murmurs, clicks, rubs,  or gallops. Abdomen:  Soft, nontender and nondistended. No masses, hepatosplenomegaly or hernias noted. Normal bowel sounds, without guarding, and without rebound.   Rectal:  Deferred until time of colonoscopy.   Msk:  Symmetrical without gross deformities. Normal posture. Extremities:  Without edema. Neurologic:  Alert and  oriented x4 Psych:  Alert and cooperative. Normal mood and affect.  Intake/Output from previous day: No intake/output data recorded. Intake/Output this shift: Total I/O In: 1050 [IV Piggyback:1050] Out: -   Lab Results: Recent Labs    12/13/20 0750  WBC 12.7*  HGB 13.6  HCT 41.1  PLT 281   BMET Recent Labs    12/13/20 0750  NA 140  K 2.9*  CL 104  CO2 27  GLUCOSE 128*  BUN 14  CREATININE 0.99  CALCIUM 9.1   LFT Recent Labs    12/13/20 0750  PROT 8.2*  ALBUMIN 4.0  AST 21  ALT 18  ALKPHOS 82  BILITOT 0.7    C-Diff Recent Labs    12/13/20 0750  CDIFFTOX NEGATIVE    Studies/Results: CT Abdomen Pelvis W Contrast  Result  Date: 12/13/2020 CLINICAL DATA:  Diverticulitis suspected; nausea and diarrhea, lower abdominal pain EXAM: CT ABDOMEN AND PELVIS WITH CONTRAST TECHNIQUE: Multidetector CT imaging of the abdomen and pelvis was performed using the standard protocol following bolus administration of intravenous contrast. CONTRAST:  OMNIPAQUE IOHEXOL 300 MG/ML  SOLN COMPARISON:  11/23/2020 FINDINGS: Lower chest: No acute abnormality. Hepatobiliary: No focal liver abnormality is seen. Status post cholecystectomy. No unexpected biliary dilatation. Pancreas: Unremarkable. Spleen: Unremarkable. Adrenals/Urinary Tract: Adrenals, kidneys, and bladder are unremarkable. Stomach/Bowel: Small hiatal hernia. Stomach is otherwise unremarkable. Bowel is normal in caliber and decompressed. Descending colon and sigmoid diverticulosis. Possible minor infiltration adjacent to the proximal sigmoid colon. Wall thickening is noted along much of the colon, greater on the left. Vascular/Lymphatic: Aortic atherosclerosis.  No enlarged lymph Reproductive: Uterus and bilateral adnexa are unremarkable. Other: No free fluid.  Abdominal wall is unremarkable. Musculoskeletal: Lower lumbar degenerative changes. No acute osseous abnormality IMPRESSION: Colonic wall thickening, some which may be related to underdistention. However, infectious/inflammatory colitis is possible. There is also distal colonic diverticulosis with possible minimal infiltration of fat adjacent to the proximal sigmoid raising the possibility diverticulitis. Electronically Signed   By: Guadlupe Spanish M.D.   On: 12/13/2020 10:55    Impression: 60 y.o. year old very pleasant female with a history of Cdiff colitis 11/23/20 requiring admission 9/20-9/22 at [facility] after exposure to antibiotics for UTI, noting some improvement at home during 10 day course of vancomycin. Overall, frequency had improved but now with several days of fatigue and then worsening of frequency of BMs yesterday  evening and characterized as watery and maladorous. Cdiff antigen positive, Cdiff toxin negative, and reflex to PCR is pending. Associated mild leukocytosis noted but renal function stable. Notable hypokalemia in setting of GI losses and dehydration. CT with colonic wall thickenign and unable to rule out sigmoid diverticulitis as well.  Clinically, she seems to have more of a refractory presentation for Cdiff instead of true recurrence. No signs of severe or fulminant CDI at this time. I suspect with hydration and resuming oral vancomycin, that she will be able to be discharged in next 24-48 hours. Will need outpatient colonoscopy for screening purposes once over acute illness.  She also reports chronic vomiting in setting of chronic GERD. No prior  EGD. On PPI as outpatient. Will pursue EGD at time of outpatient colonoscopy. For now, recommending supportive measures.   Will treat with oral vancomycin 125 mg orally QID for 14 days, then recommend taper with 125 mg orally BID for 7 days, then daily for 7 days, then every 2-3 days for 2-8 weeks depending on response. Holding off on Dificid as she responded well to vanc initially and likely has more of a refractory presentation. If no improvement on vanc, will need course of Dificid.     Plan: Vanc 125 mg orally QID for 14 days followed by taper as outlined Once over acute illness, pursue screening colonoscopy and diagnostic EGD Monitor for worsening of presentation Will reassess in am   Gelene Mink, PhD, ANP-BC [facility]     LOS: 0 days    12/13/2020, 1:06 PM

## 2020-12-13 NOTE — H&P (Addendum)
Patient Demographics:    Sheryl Lowe, is a 60 y.o. female  MRN: [id number]   DOB - June 11, 1960  Admit Date - 12/13/2020  Outpatient Primary MD for the patient is Chapell, Waldron Session, NP   Assessment & Plan:    Active Problems:   C. difficile colitis   1)Recurrent C. difficile Colitis----discussed with GI service -Patient was treated with oral vancomycin from 11/23/2020 to December 04, 2020 -She did well initially and now reporting increased frequency of watery stool and nausea with vomiting without bile or blood -C. difficile testing on 11/23/2020 showed positive antigen and positive toxin -C. difficile testing on 12/13/2020 with positive antigen, but Toxin negative PCR pending -GI service recommends slow vancomycin taper for over 6 weeks per protocol -IV fluids as needed antiemetics and pain medications  2)Hypokalemia/Hypomagnesemia----due to GI losses, compounded by HCTZ use -Replace and recheck  3)HTN--hold Diovan HCTZ may use IV labetalol as needed  4)H/o DM2-last A1c 6.3, reflecting excellent DM control PTA -Patient is not on routine diabetic medications -May use sliding scale  5)Sepsis secondary to C. difficile colitis --POA-- -patient with leukocytosis WBC 12.7, tachycardia and tachypnea and lactic acidosis--- lactic acid initially was 2.6 most likely from dehydration improved to 1.4 after IV fluids -Management as above #1  6)HLD----continue Lipitor  Disposition/Need for in-Hospital Stay- patient unable to be discharged at this time due to --- sepsis secondary to C. difficile colitis requiring IV fluids and vancomycin  Status is: Inpatient  Remains inpatient appropriate because: Sepsis pathophysiology requiring hydration and vancomycin  Dispo: The patient is from: Home              Anticipated d/c  is to: Home              Anticipated d/c date is: 2 days              Patient currently is not medically stable to d/c. Barriers: Not Clinically Stable-please see disposition above   With History of - Reviewed by me  Past Medical History:  Diagnosis Date   Anxiety    Clostridium difficile infection    Hyperlipidemia    Hypertension    Kidney stones    Migraine       Past Surgical History:  Procedure Laterality Date   CHOLECYSTECTOMY  2020    Chief Complaint  Patient presents with   Abdominal Pain      HPI:    Sheryl Lowe  is a 61 y.o. female with past medical history relevant for DM2, HTN anxiety disorder who was recently treated for C. difficile colitis -Patient also has a history of C. difficile colitis about 20 years ago -She apparently was treated with 3 different antibiotics back in July for UTI and then in September developed diarrhea and was diagnosed with C. difficile colitis in September 2022 -Patient was treated with oral vancomycin from 11/23/2020 to December 04, 2020 -She did well initially and now reporting  increased frequency of watery stool and nausea with vomiting without bile or blood --C. difficile testing on 11/23/2020 showed positive antigen and positive toxin -C. difficile testing on 12/13/2020 with positive antigen, but Toxin negative PCR pending -GI service recommends slow vancomycin taper for over 6 weeks per protocol -WBC is 12.7, hemoglobin 13.6, platelets 281, lipase 34 -Potassium is down to 2.9 glucose is 128, sodium is 140 creatinine 0.99 with a BUN of 14, LFTs are not elevated -Magnesium is low at 1.4   Review of systems:    In addition to the HPI above,   A full Review of  Systems was done, all other systems reviewed are negative except as noted above in HPI , .    Social History:  Reviewed by me    Social History   Tobacco Use   Smoking status: Never   Smokeless tobacco: Never  Substance Use Topics   Alcohol use: Not Currently     Family History :  Reviewed by me    Family History  Problem Relation Age of Onset   Stroke Mother    Heart disease Mother    Hypertension Father    Cancer Father    Colon cancer Neg Hx    Colon polyps Neg Hx      Home Medications:   Prior to Admission medications   Medication Sig Start Date End Date Taking? Authorizing Provider  atorvastatin (LIPITOR) 10 MG tablet Take 1 tablet (10 mg total) by mouth daily. Need f/u visit for additional refills 11/06/20  Yes Burns, Bobette Mo, MD  valsartan-hydrochlorothiazide (DIOVAN-HCT) 80-12.5 MG tablet Take 1 tablet by mouth daily. 08/16/20  Yes [provider]  citalopram (CELEXA) 10 MG tablet TAKE 1 TABLET BY MOUTH EVERY DAY Patient not taking: No sig reported 02/20/20   Pincus Sanes, MD  omeprazole (PRILOSEC) 20 MG capsule Take 1 capsule (20 mg total) by mouth daily. Patient not taking: Reported on 12/13/2020 03/26/15   Pincus Sanes, MD  rizatriptan (MAXALT) 10 MG tablet TAKE 1 TABLET BY MOUTH AS NEEDED FOR MIGRAINE**MAY REPEAT IN 2 HOURS IF NEEDED** Patient not taking: Reported on 12/13/2020 09/27/20   Pincus Sanes, MD  vancomycin (VANCOCIN) 125 MG capsule Take 1 capsule (125 mg total) by mouth 4 (four) times daily. Patient not taking: Reported on 12/13/2020 11/24/20   Maurilio Lovely D, DO     Allergies:     Allergies  Allergen Reactions   Amitriptyline     Drowsiness in the morning   Topamax [Topiramate]     Memory loss, tingling in hands and feet     Physical Exam:   Vitals  Blood pressure 127/80, pulse 95, temperature 98.8 F (37.1 C), temperature source Oral, resp. rate 18, height 5' (1.524 m), weight 69.2 kg, SpO2 97 %.  Temp:  [98.4 F (36.9 C)-99.5 F (37.5 C)] 98.8 F (37.1 C) (10/10 1707) Pulse Rate:  [95-140] 95 (10/10 1707) Resp:  [14-24] 18 (10/10 1349) BP: (114-139)/(79-96) 127/80 (10/10 1707) SpO2:  [92 %-100 %] 97 % (10/10 1707) Weight:  [69.2 kg-69.3 kg] 69.2 kg (10/10 1349)   Physical  Examination: General appearance - alert,   and in no distress  Mental status - alert, oriented to person, place, and time,  Eyes - sclera anicteric Neck - supple, no JVD elevation , Chest - clear  to auscultation bilaterally, symmetrical air movement,  Heart - S1 and S2 normal, regular  Abdomen - soft, , nondistended, abdominal discomfort on palpation no  rebound or guarding  Neurological - screening mental status exam normal, neck supple without rigidity, cranial nerves II through XII intact, DTR's normal and symmetric Extremities - no pedal edema noted, intact peripheral pulses  Skin - warm, dry     Data Review:    CBC Recent Labs  Lab 12/10/20 1004 12/13/20 0750  WBC 6.5 12.7*  HGB 13.1 13.6  HCT 41.1 41.1  PLT 284 281  MCV 90.3 87.3  MCH 28.8 28.9  MCHC 31.9 33.1  RDW 12.6 12.7  LYMPHSABS 2.2 2.3  MONOABS 0.5 1.5*  EOSABS 0.2 0.2  BASOSABS 0.1 0.1   ------------------------------------------------------------------------------------------------------------------  Chemistries  Recent Labs  Lab 12/13/20 0750  NA 140  K 2.9*  CL 104  CO2 27  GLUCOSE 128*  BUN 14  CREATININE 0.99  CALCIUM 9.1  MG 1.4*  AST 21  ALT 18  ALKPHOS 82  BILITOT 0.7   estimated creatinine clearance is 53.1 mL/min (by C-G formula based on SCr of 0.99 mg/dL).  Urinalysis    Component Value Date/Time   COLORURINE YELLOW 12/13/2020 1013   APPEARANCEUR CLEAR 12/13/2020 1013   LABSPEC <1.005 (L) 12/13/2020 1013   PHURINE 5.5 12/13/2020 1013   GLUCOSEU NEGATIVE 12/13/2020 1013   GLUCOSEU NEGATIVE 05/03/2016 1424   HGBUR NEGATIVE 12/13/2020 1013   BILIRUBINUR NEGATIVE 12/13/2020 1013   KETONESUR NEGATIVE 12/13/2020 1013   PROTEINUR NEGATIVE 12/13/2020 1013   UROBILINOGEN 0.2 05/03/2016 1424   NITRITE NEGATIVE 12/13/2020 1013   LEUKOCYTESUR NEGATIVE 12/13/2020 1013     Imaging Results:    CT Abdomen Pelvis W Contrast  Result Date: 12/13/2020 CLINICAL DATA:  Diverticulitis  suspected; nausea and diarrhea, lower abdominal pain EXAM: CT ABDOMEN AND PELVIS WITH CONTRAST TECHNIQUE: Multidetector CT imaging of the abdomen and pelvis was performed using the standard protocol following bolus administration of intravenous contrast. CONTRAST:  OMNIPAQUE IOHEXOL 300 MG/ML  SOLN COMPARISON:  11/23/2020 FINDINGS: Lower chest: No acute abnormality. Hepatobiliary: No focal liver abnormality is seen. Status post cholecystectomy. No unexpected biliary dilatation. Pancreas: Unremarkable. Spleen: Unremarkable. Adrenals/Urinary Tract: Adrenals, kidneys, and bladder are unremarkable. Stomach/Bowel: Small hiatal hernia. Stomach is otherwise unremarkable. Bowel is normal in caliber and decompressed. Descending colon and sigmoid diverticulosis. Possible minor infiltration adjacent to the proximal sigmoid colon. Wall thickening is noted along much of the colon, greater on the left. Vascular/Lymphatic: Aortic atherosclerosis.  No enlarged lymph Reproductive: Uterus and bilateral adnexa are unremarkable. Other: No free fluid.  Abdominal wall is unremarkable. Musculoskeletal: Lower lumbar degenerative changes. No acute osseous abnormality IMPRESSION: Colonic wall thickening, some which may be related to underdistention. However, infectious/inflammatory colitis is possible. There is also distal colonic diverticulosis with possible minimal infiltration of fat adjacent to the proximal sigmoid raising the possibility diverticulitis. Electronically Signed   By: Guadlupe Spanish M.D.   On: 12/13/2020 10:55    Radiological Exams on Admission: CT Abdomen Pelvis W Contrast  Result Date: 12/13/2020 CLINICAL DATA:  Diverticulitis suspected; nausea and diarrhea, lower abdominal pain EXAM: CT ABDOMEN AND PELVIS WITH CONTRAST TECHNIQUE: Multidetector CT imaging of the abdomen and pelvis was performed using the standard protocol following bolus administration of intravenous contrast. CONTRAST:  OMNIPAQUE  IOHEXOL 300 MG/ML  SOLN COMPARISON:  11/23/2020 FINDINGS: Lower chest: No acute abnormality. Hepatobiliary: No focal liver abnormality is seen. Status post cholecystectomy. No unexpected biliary dilatation. Pancreas: Unremarkable. Spleen: Unremarkable. Adrenals/Urinary Tract: Adrenals, kidneys, and bladder are unremarkable. Stomach/Bowel: Small hiatal hernia. Stomach is otherwise unremarkable. Bowel is normal in caliber  and decompressed. Descending colon and sigmoid diverticulosis. Possible minor infiltration adjacent to the proximal sigmoid colon. Wall thickening is noted along much of the colon, greater on the left. Vascular/Lymphatic: Aortic atherosclerosis.  No enlarged lymph Reproductive: Uterus and bilateral adnexa are unremarkable. Other: No free fluid.  Abdominal wall is unremarkable. Musculoskeletal: Lower lumbar degenerative changes. No acute osseous abnormality IMPRESSION: Colonic wall thickening, some which may be related to underdistention. However, infectious/inflammatory colitis is possible. There is also distal colonic diverticulosis with possible minimal infiltration of fat adjacent to the proximal sigmoid raising the possibility diverticulitis. Electronically Signed   By: Guadlupe Spanish M.D.   On: 12/13/2020 10:55    DVT Prophylaxis -SCD/Heparin AM Labs Ordered, also please review Full Orders  Family Communication: Admission, patients condition and plan of care including tests being ordered have been discussed with the patient  who indicate understanding and agree with the plan   Code Status - Full Code  Likely DC to  home  Condition   stable  Shon Hale M.D on 12/13/2020 at 5:11 PM Go to [email or web address] -  for contact info  [facility] - Office  [phone]

## 2020-12-13 NOTE — ED Notes (Signed)
ED TO INPATIENT HANDOFF REPORT  ED Nurse Name and Phone #: [phone]  S Name/Age/Gender Sheryl Lowe 60 y.o. female Room/Bed: APA03/APA03  Code Status   Code Status: Prior  Home/SNF/Other Home Patient oriented to: self, place, time, and situation Is this baseline? Yes   Triage Complete: Triage complete  Chief Complaint C. difficile colitis [A04.72]  Triage Note Pt c/o worsening lower abdomen, left lower back pain, nausea and diarrhea that started yesterday. Pt recently diagnosed with C. Diff.   Allergies Allergies  Allergen Reactions   Amitriptyline     Drowsiness in the morning   Topamax [Topiramate]     Memory loss, tingling in hands and feet    Level of Care/Admitting Diagnosis ED Disposition     ED Disposition  Admit   Condition  --   Comment  Hospital Area: [facility] [[id number]]  Level of Care: Med-Surg [16]  Covid Evaluation: Asymptomatic Screening Protocol (No Symptoms)  Diagnosis: C. difficile colitis [382505]  Admitting Physician: Marylyn Ishihara  Attending Physician: Marylyn Ishihara  Estimated length of stay: past midnight tomorrow  Certification:: I certify this patient will need inpatient services for at least 2 midnights          B Medical/Surgery History Past Medical History:  Diagnosis Date   Anxiety    Clostridium difficile infection    Hyperlipidemia    Hypertension    Kidney stones    Migraine    Past Surgical History:  Procedure Laterality Date   CHOLECYSTECTOMY  2020     A IV Location/Drains/Wounds Patient Lines/Drains/Airways Status     Active Line/Drains/Airways     Name Placement date Placement time Site Days   Peripheral IV 12/13/20 20 G 1" Right;Upper Forearm 12/13/20  0748  Forearm  less than 1   Closed System Drain 1 Right Abdomen Bulb (JP) 10 Fr. 10/01/18  1200  Abdomen  804            Intake/Output Last 24 hours  Intake/Output Summary (Last 24 hours) at 12/13/2020  1325 Last data filed at 12/13/2020 1051 Gross per 24 hour  Intake 1050 ml  Output --  Net 1050 ml    Labs/Imaging Results for orders placed or performed during the hospital encounter of 12/13/20 (from the past 48 hour(s))  Comprehensive metabolic panel     Status: Abnormal   Collection Time: 12/13/20  7:50 AM  Result Value Ref Range   Sodium 140 135 - 145 mmol/L   Potassium 2.9 (L) 3.5 - 5.1 mmol/L   Chloride 104 98 - 111 mmol/L   CO2 27 22 - 32 mmol/L   Glucose, Bld 128 (H) 70 - 99 mg/dL    Comment: Glucose reference range applies only to samples taken after fasting for at least 8 hours.   BUN 14 6 - 20 mg/dL   Creatinine, Ser 3.97 0.44 - 1.00 mg/dL   Calcium 9.1 8.9 - 67.3 mg/dL   Total Protein 8.2 (H) 6.5 - 8.1 g/dL   Albumin 4.0 3.5 - 5.0 g/dL   AST 21 15 - 41 U/L   ALT 18 0 - 44 U/L   Alkaline Phosphatase 82 38 - 126 U/L   Total Bilirubin 0.7 0.3 - 1.2 mg/dL   GFR, Estimated >41 >93 mL/min    Comment: (NOTE) Calculated using the CKD-EPI Creatinine Equation (2021)    Anion gap 9 5 - 15    Comment: Performed at [facility], [address], [place], [place] [address]  Lipase, blood     Status: None   Collection Time: 12/13/20  7:50 AM  Result Value Ref Range   Lipase 34 11 - 51 U/L    Comment: Performed at [facility], [address], [place], [place] [address]  CBC with Differential     Status: Abnormal   Collection Time: 12/13/20  7:50 AM  Result Value Ref Range   WBC 12.7 (H) 4.0 - 10.5 K/uL   RBC 4.71 3.87 - 5.11 MIL/uL   Hemoglobin 13.6 12.0 - 15.0 g/dL   HCT 02.7 25.3 - 66.4 %   MCV 87.3 80.0 - 100.0 fL   MCH 28.9 26.0 - 34.0 pg   MCHC 33.1 30.0 - 36.0 g/dL   RDW 40.3 47.4 - 25.9 %   Platelets 281 150 - 400 K/uL   nRBC 0.0 0.0 - 0.2 %   Neutrophils Relative % 67 %   Neutro Abs 8.6 (H) 1.7 - 7.7 K/uL   Lymphocytes Relative 18 %   Lymphs Abs 2.3 0.7 - 4.0 K/uL   Monocytes Relative 12 %   Monocytes Absolute 1.5 (H) 0.1 - 1.0 K/uL   Eosinophils Relative 1  %   Eosinophils Absolute 0.2 0.0 - 0.5 K/uL   Basophils Relative 1 %   Basophils Absolute 0.1 0.0 - 0.1 K/uL   Immature Granulocytes 1 %   Abs Immature Granulocytes 0.06 0.00 - 0.07 K/uL    Comment: Performed at [facility], [address] [place], [place] [address]  Lactic acid, plasma     Status: Abnormal   Collection Time: 12/13/20  7:50 AM  Result Value Ref Range   Lactic Acid, Venous 2.6 (HH) 0.5 - 1.9 mmol/L    Comment: CRITICAL RESULT CALLED TO, READ BACK BY AND VERIFIED WITH: MYRICK,B AT 8:15AM ON 12/13/20 BY [facility] Performed at [facility], [address] [place], [place] [address]   C Difficile Quick Screen w PCR reflex     Status: Abnormal   Collection Time: 12/13/20  7:50 AM   Specimen: STOOL  Result Value Ref Range   C Diff antigen POSITIVE (A) NEGATIVE   C Diff toxin NEGATIVE NEGATIVE   C Diff interpretation Results are indeterminate. See PCR results.     Comment: Performed at [facility], [address] [place], [place] [address]  Magnesium     Status: Abnormal   Collection Time: 12/13/20  7:50 AM  Result Value Ref Range   Magnesium 1.4 (L) 1.7 - 2.4 mg/dL    Comment: Performed at [facility], [address] [place], [place] [address]  Resp Panel by RT-PCR (Flu A&B, Covid) Nasopharyngeal Swab     Status: None   Collection Time: 12/13/20  8:32 AM   Specimen: Nasopharyngeal Swab; Nasopharyngeal(NP) swabs in vial transport medium  Result Value Ref Range   SARS Coronavirus 2 by RT PCR NEGATIVE NEGATIVE    Comment: (NOTE) SARS-CoV-2 target nucleic acids are NOT DETECTED.  The SARS-CoV-2 RNA is generally detectable in upper respiratory specimens during the acute phase of infection. The lowest concentration of SARS-CoV-2 viral copies this assay can detect is 138 copies/mL. A negative result does not preclude SARS-Cov-2 infection and should not be used as the sole basis for treatment or other patient management decisions. A negative result may occur with   improper specimen collection/handling, submission of specimen other than nasopharyngeal swab, presence of viral mutation(s) within the areas targeted by this assay, and inadequate number of viral copies(<138 copies/mL). A negative result must  be combined with clinical observations, patient history, and epidemiological information. The expected result is Negative.  Fact Sheet for Patients:  BloggerCourse.com  Fact Sheet for Healthcare Providers:  SeriousBroker.it  This test is no t yet approved or cleared by the [place] FDA and  has been authorized for detection and/or diagnosis of SARS-CoV-2 by FDA under an Emergency Use Authorization (EUA). This EUA will remain  in effect (meaning this test can be used) for the duration of the COVID-19 declaration under Section 564(b)(1) of the Act, 21 U.S.C.section 360bbb-3(b)(1), unless the authorization is terminated  or revoked sooner.       Influenza A by PCR NEGATIVE NEGATIVE   Influenza B by PCR NEGATIVE NEGATIVE    Comment: (NOTE) The Xpert Xpress SARS-CoV-2/FLU/RSV plus assay is intended as an aid in the diagnosis of influenza from Nasopharyngeal swab specimens and should not be used as a sole basis for treatment. Nasal washings and aspirates are unacceptable for Xpert Xpress SARS-CoV-2/FLU/RSV testing.  Fact Sheet for Patients: BloggerCourse.com  Fact Sheet for Healthcare Providers: SeriousBroker.it  This test is not yet approved or cleared by the [place] FDA and has been authorized for detection and/or diagnosis of SARS-CoV-2 by FDA under an Emergency Use Authorization (EUA). This EUA will remain in effect (meaning this test can be used) for the duration of the COVID-19 declaration under Section 564(b)(1) of the Act, 21 U.S.C. section 360bbb-3(b)(1), unless the authorization is terminated or revoked.  Performed at  [facility], [address], [place], [place] [address]   Lactic acid, plasma     Status: None   Collection Time: 12/13/20 10:11 AM  Result Value Ref Range   Lactic Acid, Venous 1.4 0.5 - 1.9 mmol/L    Comment: Performed at [facility], [address], [place], [place] [address]  Urinalysis, Routine w reflex microscopic Urine, Clean Catch     Status: Abnormal   Collection Time: 12/13/20 10:13 AM  Result Value Ref Range   Color, Urine YELLOW YELLOW   APPearance CLEAR CLEAR   Specific Gravity, Urine <1.005 (L) 1.005 - 1.030   pH 5.5 5.0 - 8.0   Glucose, UA NEGATIVE NEGATIVE mg/dL   Hgb urine dipstick NEGATIVE NEGATIVE   Bilirubin Urine NEGATIVE NEGATIVE   Ketones, ur NEGATIVE NEGATIVE mg/dL   Protein, ur NEGATIVE NEGATIVE mg/dL   Nitrite NEGATIVE NEGATIVE   Leukocytes,Ua NEGATIVE NEGATIVE    Comment: Microscopic not done on urines with negative protein, blood, leukocytes, nitrite, or glucose < 500 mg/dL. Performed at [facility], [address], [place], [place] [address]    CT Abdomen Pelvis W Contrast  Result Date: 12/13/2020 CLINICAL DATA:  Diverticulitis suspected; nausea and diarrhea, lower abdominal pain EXAM: CT ABDOMEN AND PELVIS WITH CONTRAST TECHNIQUE: Multidetector CT imaging of the abdomen and pelvis was performed using the standard protocol following bolus administration of intravenous contrast. CONTRAST:  OMNIPAQUE IOHEXOL 300 MG/ML  SOLN COMPARISON:  11/23/2020 FINDINGS: Lower chest: No acute abnormality. Hepatobiliary: No focal liver abnormality is seen. Status post cholecystectomy. No unexpected biliary dilatation. Pancreas: Unremarkable. Spleen: Unremarkable. Adrenals/Urinary Tract: Adrenals, kidneys, and bladder are unremarkable. Stomach/Bowel: Small hiatal hernia. Stomach is otherwise unremarkable. Bowel is normal in caliber and decompressed. Descending colon and sigmoid diverticulosis. Possible minor infiltration adjacent to the proximal sigmoid colon. Wall  thickening is noted along much of the colon, greater on the left. Vascular/Lymphatic: Aortic atherosclerosis.  No enlarged lymph Reproductive: Uterus and bilateral adnexa are unremarkable. Other: No free fluid.  Abdominal wall  is unremarkable. Musculoskeletal: Lower lumbar degenerative changes. No acute osseous abnormality IMPRESSION: Colonic wall thickening, some which may be related to underdistention. However, infectious/inflammatory colitis is possible. There is also distal colonic diverticulosis with possible minimal infiltration of fat adjacent to the proximal sigmoid raising the possibility diverticulitis. Electronically Signed   By: Guadlupe Spanish M.D.   On: 12/13/2020 10:55    Pending Labs Unresulted Labs (From admission, onward)     Start     Ordered   12/13/20 0955  Gastrointestinal Panel by PCR , Stool  Once,   R        12/13/20 0955   12/13/20 0750  C. Diff by PCR, Reflexed  Once,   STAT        12/13/20 0750            Vitals/Pain Today's Vitals   12/13/20 1136 12/13/20 1200 12/13/20 1230 12/13/20 1300  BP:  (!) 114/93 115/84 132/84  Pulse: (!) 125 (!) 119 (!) 118 (!) 122  Resp: 16 19 19 16   Temp:      TempSrc:      SpO2: 96% 98% 96% 92%  Weight:      Height:      PainSc:        Isolation Precautions Enteric precautions (UV disinfection)  Medications Medications  vancomycin (VANCOCIN) capsule 125 mg (has no administration in time range)    Followed by  vancomycin (VANCOCIN) capsule 125 mg (has no administration in time range)    Followed by  vancomycin (VANCOCIN) capsule 125 mg (has no administration in time range)    Followed by  vancomycin (VANCOCIN) capsule 125 mg (has no administration in time range)    Followed by  vancomycin (VANCOCIN) capsule 125 mg (has no administration in time range)  0.9 %  sodium chloride infusion (has no administration in time range)  sodium chloride 0.9 % bolus 1,000 mL (0 mLs Intravenous Stopped 12/13/20 0914)  potassium  chloride SA (KLOR-CON) CR tablet 40 mEq (40 mEq Oral Given 12/13/20 0831)  magnesium sulfate IVPB 2 g 50 mL (0 g Intravenous Stopped 12/13/20 1051)  iohexol (OMNIPAQUE) 300 MG/ML solution 100 mL (100 mLs Intravenous Contrast Given 12/13/20 0950)  sodium chloride 0.9 % bolus 1,000 mL (1,000 mLs Intravenous New Bag/Given 12/13/20 1134)    Mobility walks Low fall risk   Focused Assessments    R Recommendations: See Admitting Provider Note  Report given to:   Additional Notes:

## 2020-12-13 NOTE — ED Notes (Signed)
Pt transported to CT ?

## 2020-12-13 NOTE — ED Notes (Signed)
Date and time results received: 12/13/20 0815  Test: lactic acid Critical Value: 2.6  Name of Provider Notified: [email or web address]  Orders Received? Or Actions Taken?: MD notified

## 2020-12-13 NOTE — ED Triage Notes (Signed)
Pt c/o worsening lower abdomen, left lower back pain, nausea and diarrhea that started yesterday. Pt recently diagnosed with C. Diff.

## 2020-12-13 NOTE — ED Notes (Signed)
Lab called, not enough stool to do GI PCR study, will need to recollect.

## 2020-12-13 NOTE — ED Provider Notes (Signed)
[facility] EMERGENCY DEPARTMENT Provider Note   CSN: [id number] Arrival date & time: 12/13/20  0710     History Chief Complaint  Patient presents with   Abdominal Pain    Sheryl Lowe is a 60 y.o. female.  She was recently admitted about 20 days ago for C. Difficile.  She had been treated with antibiotics for UTIs prior to that.  She was discharged on oral vancomycin and has finished that course.  For the last few days she has had increased and cramping left lower back pain radiating to left lower quadrant along with 24 hours of frequent loose stools.  No blood in the stools.  Subjective fevers and chills.    The history is provided by the patient.  Abdominal Pain Pain location:  LLQ and L flank Pain quality: cramping   Pain severity:  Moderate Onset quality:  Gradual Duration:  3 days Timing:  Intermittent Progression:  Unchanged Chronicity:  Recurrent Relieved by:  None tried Worsened by:  Nothing Ineffective treatments:  None tried Associated symptoms: chills, cough, diarrhea and fever   Associated symptoms: no chest pain, no dysuria, no hematemesis, no hematochezia, no hematuria, no shortness of breath, no sore throat and no vomiting   Diarrhea:    Quality:  Mucous and malodorous   Severity:  Moderate   Timing:  Intermittent   Progression:  Unchanged Risk factors: recent hospitalization       Past Medical History:  Diagnosis Date   Anxiety    Clostridium difficile infection    Hyperlipidemia    Hypertension    Kidney stones    Migraine     Patient Active Problem List   Diagnosis Date Noted   Other pancytopenia (HCC) 12/10/2020   Clostridium difficile colitis 11/23/2020   GAD (generalized anxiety disorder) 02/01/2018   Diabetes mellitus without complication (HCC) 03/05/2017   Overweight 05/03/2016   Chronic headaches 03/27/2015   GERD (gastroesophageal reflux disease) 03/27/2015   Hyperlipidemia 03/26/2015   Essential hypertension, benign 03/26/2015    Migraine 03/26/2015    Past Surgical History:  Procedure Laterality Date   CHOLECYSTECTOMY  2020     OB History   No obstetric history on file.     Family History  Problem Relation Age of Onset   Stroke Mother    Heart disease Mother    Hypertension Father    Cancer Father     Social History   Tobacco Use   Smoking status: Never   Smokeless tobacco: Never  Vaping Use   Vaping Use: Never used  Substance Use Topics   Alcohol use: Not Currently   Drug use: No    Home Medications Prior to Admission medications   Medication Sig Start Date End Date Taking? Authorizing Provider  atorvastatin (LIPITOR) 10 MG tablet Take 1 tablet (10 mg total) by mouth daily. Need f/u visit for additional refills 11/06/20   Pincus Sanes, MD  citalopram (CELEXA) 10 MG tablet TAKE 1 TABLET BY MOUTH EVERY DAY 02/20/20   Pincus Sanes, MD  omeprazole (PRILOSEC) 20 MG capsule Take 1 capsule (20 mg total) by mouth daily. 03/26/15   Burns, Bobette Mo, MD  rizatriptan (MAXALT) 10 MG tablet TAKE 1 TABLET BY MOUTH AS NEEDED FOR MIGRAINE**MAY REPEAT IN 2 HOURS IF NEEDED** 09/27/20   Burns, Bobette Mo, MD  valsartan-hydrochlorothiazide (DIOVAN-HCT) 80-12.5 MG tablet Take 1 tablet by mouth daily. 08/16/20   [provider]  vancomycin (VANCOCIN) 125 MG capsule Take 1 capsule (125 mg  total) by mouth 4 (four) times daily. 11/24/20   Sherryll Burger, Pratik D, DO    Allergies    Amitriptyline and Topamax [topiramate]  Review of Systems   Review of Systems  Constitutional:  Positive for chills and fever.  HENT:  Negative for sore throat.   Eyes:  Negative for visual disturbance.  Respiratory:  Positive for cough. Negative for shortness of breath.   Cardiovascular:  Negative for chest pain.  Gastrointestinal:  Positive for abdominal pain and diarrhea. Negative for hematemesis, hematochezia and vomiting.  Genitourinary:  Negative for dysuria and hematuria.  Musculoskeletal:  Positive for back pain.  Skin:  Negative  for rash.  Neurological:  Negative for headaches.   Physical Exam Updated Vital Signs BP 127/88 (BP Location: Left Arm)   Pulse (!) 140   Temp 99.5 F (37.5 C) (Oral)   Resp 18   Ht 5\' 1"  (1.549 m)   Wt 69.3 kg   SpO2 99%   BMI 28.87 kg/m   Physical Exam Vitals and nursing note reviewed.  Constitutional:      General: She is not in acute distress.    Appearance: Normal appearance. She is well-developed.  HENT:     Head: Normocephalic and atraumatic.  Eyes:     Conjunctiva/sclera: Conjunctivae normal.  Cardiovascular:     Rate and Rhythm: Regular rhythm. Tachycardia present.     Heart sounds: No murmur heard. Pulmonary:     Effort: Pulmonary effort is normal. No respiratory distress.     Breath sounds: Normal breath sounds.  Abdominal:     Palpations: Abdomen is soft.     Tenderness: There is no abdominal tenderness. There is no guarding or rebound.  Musculoskeletal:        General: Normal range of motion.     Cervical back: Neck supple.     Right lower leg: No edema.     Left lower leg: No edema.  Skin:    General: Skin is warm and dry.  Neurological:     General: No focal deficit present.     Mental Status: She is alert.    ED Results / Procedures / Treatments   Labs (all labs ordered are listed, but only abnormal results are displayed) Labs Reviewed  C DIFFICILE QUICK SCREEN W PCR REFLEX   - Abnormal; Notable for the following components:      Result Value   C Diff antigen POSITIVE (*)    All other components within normal limits  CLOSTRIDIUM DIFFICILE BY PCR, REFLEXED - Abnormal; Notable for the following components:   Toxigenic C. Difficile by PCR POSITIVE (*)    All other components within normal limits  COMPREHENSIVE METABOLIC PANEL - Abnormal; Notable for the following components:   Potassium 2.9 (*)    Glucose, Bld 128 (*)    Total Protein 8.2 (*)    All other components within normal limits  CBC WITH DIFFERENTIAL/PLATELET - Abnormal; Notable for  the following components:   WBC 12.7 (*)    Neutro Abs 8.6 (*)    Monocytes Absolute 1.5 (*)    All other components within normal limits  URINALYSIS, ROUTINE W REFLEX MICROSCOPIC - Abnormal; Notable for the following components:   Specific Gravity, Urine <1.005 (*)    All other components within normal limits  LACTIC ACID, PLASMA - Abnormal; Notable for the following components:   Lactic Acid, Venous 2.6 (*)    All other components within normal limits  MAGNESIUM - Abnormal; Notable for the  following components:   Magnesium 1.4 (*)    All other components within normal limits  RESP PANEL BY RT-PCR (FLU A&B, COVID) [id number]  GASTROINTESTINAL PANEL BY PCR, STOOL (REPLACES STOOL CULTURE)  LIPASE, BLOOD  LACTIC ACID, PLASMA  GLUCOSE, CAPILLARY  BASIC METABOLIC PANEL  CBC    EKG EKG Interpretation  Date/Time:  Monday December 13 2020 07:59:09 EDT Ventricular Rate:  135 PR Interval:  122 QRS Duration: 81 QT Interval:  277 QTC Calculation: 416 R Axis:   -40 Text Interpretation: Sinus tachycardia Left axis deviation Low voltage, precordial leads Abnormal R-wave progression, late transition Borderline repolarization abnormality No significant change since prior 9/22 Confirmed by Meridee Score [phone]) on 12/13/2020 8:03:24 AM  Radiology CT Abdomen Pelvis W Contrast  Result Date: 12/13/2020 CLINICAL DATA:  Diverticulitis suspected; nausea and diarrhea, lower abdominal pain EXAM: CT ABDOMEN AND PELVIS WITH CONTRAST TECHNIQUE: Multidetector CT imaging of the abdomen and pelvis was performed using the standard protocol following bolus administration of intravenous contrast. CONTRAST:  OMNIPAQUE IOHEXOL 300 MG/ML  SOLN COMPARISON:  11/23/2020 FINDINGS: Lower chest: No acute abnormality. Hepatobiliary: No focal liver abnormality is seen. Status post cholecystectomy. No unexpected biliary dilatation. Pancreas: Unremarkable. Spleen: Unremarkable. Adrenals/Urinary Tract: Adrenals, kidneys,  and bladder are unremarkable. Stomach/Bowel: Small hiatal hernia. Stomach is otherwise unremarkable. Bowel is normal in caliber and decompressed. Descending colon and sigmoid diverticulosis. Possible minor infiltration adjacent to the proximal sigmoid colon. Wall thickening is noted along much of the colon, greater on the left. Vascular/Lymphatic: Aortic atherosclerosis.  No enlarged lymph Reproductive: Uterus and bilateral adnexa are unremarkable. Other: No free fluid.  Abdominal wall is unremarkable. Musculoskeletal: Lower lumbar degenerative changes. No acute osseous abnormality IMPRESSION: Colonic wall thickening, some which may be related to underdistention. However, infectious/inflammatory colitis is possible. There is also distal colonic diverticulosis with possible minimal infiltration of fat adjacent to the proximal sigmoid raising the possibility diverticulitis. Electronically Signed   By: Guadlupe Spanish M.D.   On: 12/13/2020 10:55    Procedures Procedures   Medications Ordered in ED Medications  vancomycin (VANCOCIN) capsule 125 mg (125 mg Oral Given 12/13/20 2104)    Followed by  vancomycin (VANCOCIN) capsule 125 mg (has no administration in time range)    Followed by  vancomycin (VANCOCIN) capsule 125 mg (has no administration in time range)    Followed by  vancomycin (VANCOCIN) capsule 125 mg (has no administration in time range)    Followed by  vancomycin (VANCOCIN) capsule 125 mg (has no administration in time range)  0.9 %  sodium chloride infusion ( Intravenous Restarted 12/13/20 1644)  acetaminophen (TYLENOL) tablet 650 mg (650 mg Oral Given 12/13/20 1550)  potassium chloride 10 mEq in 100 mL IVPB (10 mEq Intravenous New Bag/Given 12/13/20 2104)  atorvastatin (LIPITOR) tablet 10 mg (10 mg Oral Given 12/13/20 1709)  sodium chloride flush (NS) 0.9 % injection 3 mL (3 mLs Intravenous Given 12/13/20 2106)  sodium chloride flush (NS) 0.9 % injection 3 mL (3 mLs Intravenous Given  12/13/20 2105)  sodium chloride flush (NS) 0.9 % injection 3 mL (has no administration in time range)  0.9 %  sodium chloride infusion (has no administration in time range)  acetaminophen (TYLENOL) tablet 650 mg (has no administration in time range)    Or  acetaminophen (TYLENOL) suppository 650 mg (has no administration in time range)  traZODone (DESYREL) tablet 50 mg (50 mg Oral Given 12/13/20 2104)  ondansetron (ZOFRAN) tablet 4 mg (4 mg Oral Given 12/13/20 1950)  Or  ondansetron [facility] An) injection 4 mg ( Intravenous See Alternative 12/13/20 1950)  heparin injection 5,000 Units (5,000 Units Subcutaneous Given 12/13/20 2105)  multivitamin with minerals tablet 1 tablet (1 tablet Oral Patient Refused/Not Given 12/13/20 1708)  fentaNYL (SUBLIMAZE) injection 25 mcg (has no administration in time range)  labetalol (NORMODYNE) injection 10 mg (has no administration in time range)  HYDROcodone-acetaminophen (NORCO) 7.5-325 MG per tablet 1 tablet (has no administration in time range)  insulin aspart (novoLOG) injection 0-6 Units (has no administration in time range)  insulin aspart (novoLOG) injection 0-5 Units (has no administration in time range)  sodium chloride 0.9 % bolus 1,000 mL (0 mLs Intravenous Stopped 12/13/20 0914)  potassium chloride SA (KLOR-CON) CR tablet 40 mEq (40 mEq Oral Given 12/13/20 0831)  magnesium sulfate IVPB 2 g 50 mL (0 g Intravenous Stopped 12/13/20 1051)  iohexol (OMNIPAQUE) 300 MG/ML solution 100 mL (100 mLs Intravenous Contrast Given 12/13/20 0950)  sodium chloride 0.9 % bolus 1,000 mL (0 mLs Intravenous Stopped 12/13/20 1335)    ED Course  I have reviewed the triage vital signs and the nursing notes.  Pertinent labs & imaging results that were available during my care of the patient were reviewed by me and considered in my medical decision making (see chart for details).  Clinical Course as of 12/13/20 2120  Mon Dec 13, 2020  1152 Discussed with Dr. Marletta Lor  GI.  He said that she may just have a recurrence of her C. difficile.  Recommended continuing oral Vanco and may need a 6 to 8-week prolonged course.  He will see the patient in consult. [MB]    Clinical Course User Index [MB] Terrilee Files, MD   MDM Rules/Calculators/A&P                          Najmah Carradine was evaluated in Emergency Department on 12/13/2020 for the symptoms described in the history of present illness. She was evaluated in the context of the global COVID-19 pandemic, which necessitated consideration that the patient might be at risk for infection with the SARS-CoV-2 virus that causes COVID-19. Institutional protocols and algorithms that pertain to the evaluation of patients at risk for COVID-19 are in a state of rapid change based on information released by regulatory bodies including the CDC and federal and state organizations. These policies and algorithms were followed during the patient's care in the ED.  This patient complains of left lower quadrant abdominal pain radiating to back, frequent loose stools in the setting of recent C. difficile; this involves an extensive number of treatment Options and is a complaint that carries with it a high risk of complications and Morbidity. The differential includes C. difficile, colitis, diverticulitis, sepsis, Sirs, dehydration, metabolic derangement, perforation  I ordered, reviewed and interpreted labs, which included CBC with elevated white count, hemoglobin stable, chemistries with low potassium low magnesium, C. difficile positive, lactate elevated and cleared with fluids, GI panel pending at time of admission, urinalysis without signs of infection I ordered medication IV fluids I ordered imaging studies which included CT abdomen pelvis and I independently    visualized and interpreted imaging which showed colitis possible diverticulitis Additional history obtained from patient's husband Previous records obtained and  reviewed in epic including prior admission I consulted Dr. Marletta Lor GI and Dr. Mariea Clonts I tried hospitalist and discussed lab and imaging findings  Critical Interventions: None  After the interventions stated above, I reevaluated the patient and  found patient still to be tachycardic.  Due to her poor intake and stool output feel she would benefit from admission to the hospital for continued hydration and management of her possible recurrence of C. difficile.   Final Clinical Impression(s) / ED Diagnoses Final diagnoses:  Enteritis due to Clostridium difficile  Hypokalemia  Hypomagnesemia  SIRS (systemic inflammatory response syndrome) (HCC)    Rx / DC Orders ED Discharge Orders     None        Terrilee Files, MD 12/13/20 2124

## 2020-12-14 ENCOUNTER — Telehealth: Payer: Self-pay | Admitting: Gastroenterology

## 2020-12-14 ENCOUNTER — Other Ambulatory Visit (HOSPITAL_COMMUNITY): Payer: Self-pay

## 2020-12-14 DIAGNOSIS — R197 Diarrhea, unspecified: Secondary | ICD-10-CM | POA: Diagnosis not present

## 2020-12-14 DIAGNOSIS — A0472 Enterocolitis due to Clostridium difficile, not specified as recurrent: Secondary | ICD-10-CM | POA: Diagnosis not present

## 2020-12-14 LAB — BASIC METABOLIC PANEL
Anion gap: 6 (ref 5–15)
BUN: 7 mg/dL (ref 6–20)
CO2: 22 mmol/L (ref 22–32)
Calcium: 8.5 mg/dL — ABNORMAL LOW (ref 8.9–10.3)
Chloride: 114 mmol/L — ABNORMAL HIGH (ref 98–111)
Creatinine, Ser: 0.83 mg/dL (ref 0.44–1.00)
GFR, Estimated: >60 mL/min (ref >60)
Glucose, Bld: 100 mg/dL — ABNORMAL HIGH (ref 70–99)
Potassium: 3.7 mmol/L (ref 3.5–5.1)
Sodium: 142 mmol/L (ref 135–145)

## 2020-12-14 LAB — GASTROINTESTINAL PANEL BY PCR, STOOL (REPLACES STOOL CULTURE)

## 2020-12-14 LAB — CBC
HCT: 31.8 % — ABNORMAL LOW (ref 36.0–46.0)
Hemoglobin: 10.4 g/dL — ABNORMAL LOW (ref 12.0–15.0)
MCH: 29.9 pg (ref 26.0–34.0)
MCHC: 32.7 g/dL (ref 30.0–36.0)
MCV: 91.4 fL (ref 80.0–100.0)
Platelets: 175 10*3/uL (ref 150–400)
RBC: 3.48 MIL/uL — ABNORMAL LOW (ref 3.87–5.11)
RDW: 13.1 % (ref 11.5–15.5)
WBC: 5.2 10*3/uL (ref 4.0–10.5)
nRBC: 0 % (ref 0.0–0.2)

## 2020-12-14 LAB — GLUCOSE, CAPILLARY
Glucose-Capillary: 117 mg/dL — ABNORMAL HIGH (ref 70–99)
Glucose-Capillary: 85 mg/dL (ref 70–99)
Glucose-Capillary: 91 mg/dL (ref 70–99)

## 2020-12-14 MED ORDER — POTASSIUM CHLORIDE CRYS ER 20 MEQ PO TBCR
40.0000 meq | EXTENDED_RELEASE_TABLET | Freq: Once | ORAL | Status: AC
  Administered 2020-12-14: 40 meq via ORAL
  Filled 2020-12-14: qty 2

## 2020-12-14 MED ORDER — VANCOMYCIN HCL 125 MG PO CAPS
125.0000 mg | ORAL_CAPSULE | ORAL | 0 refills | Status: DC
  Filled 2020-12-14: qty 32, 28d supply, fill #0
  Filled 2020-12-14: qty 56, 14d supply, fill #0

## 2020-12-14 MED ORDER — VALSARTAN-HYDROCHLOROTHIAZIDE 80-12.5 MG PO TABS
0.5000 | ORAL_TABLET | Freq: Every day | ORAL | 1 refills | Status: DC
  Filled 2020-12-14: qty 15, 30d supply, fill #0

## 2020-12-14 MED ORDER — ONDANSETRON HCL 4 MG PO TABS
4.0000 mg | ORAL_TABLET | Freq: Four times a day (QID) | ORAL | 0 refills | Status: DC | PRN
  Filled 2020-12-14: qty 20, 13d supply, fill #0

## 2020-12-14 NOTE — Discharge Summary (Signed)
Sheryl Lowe, is a 60 y.o. female  DOB 1960-10-13  MRN [id number].  Admission date:  12/13/2020  Admitting Physician  Shon Hale, MD  Discharge Date:  12/14/2020   Primary MD  Chapell, Waldron Session, NP  Recommendations for primary care physician for things to follow:   1)Take oral Vancomycin 1 cap 4 times daily for 14 days, then 1 Cap twice daily for 7 days, then 1 cap Daily for 7 days, then 1 cap daily every other day for 1 week, then 1 cap 3 times a week for another week, then STOP  2)Follow-up Gastroenterologist Dr. Earnest Bailey with [facility]--- in 2 to 3 weeks for evaluation  -address: [address], [place], [place] [address], Phone: [phone]  Admission Diagnosis  C. difficile colitis [A04.72]   Discharge Diagnosis  C. difficile colitis [A04.72]    Active Problems:   C. difficile colitis   Diarrhea of presumed infectious origin      Past Medical History:  Diagnosis Date   Anxiety    Clostridium difficile infection    Hyperlipidemia    Hypertension    Kidney stones    Migraine     Past Surgical History:  Procedure Laterality Date   CHOLECYSTECTOMY  2020       HPI  from the history and physical done on the day of admission:    Sheryl Lowe  is a 60 y.o. female with past medical history relevant for DM2, HTN anxiety disorder who was recently treated for C. difficile colitis -Patient also has a history of C. difficile colitis about 20 years ago -She apparently was treated with 3 different antibiotics back in July for UTI and then in September developed diarrhea and was diagnosed with C. difficile colitis in September 2022 -Patient was treated with oral vancomycin from 11/23/2020 to December 04, 2020 -She did well initially and now reporting increased frequency of watery stool and nausea with vomiting without bile or blood --C. difficile testing on  11/23/2020 showed positive antigen and positive toxin -C. difficile testing on 12/13/2020 with positive antigen, but Toxin negative PCR pending -GI service recommends slow vancomycin taper for over 6 weeks per protocol -WBC is 12.7, hemoglobin 13.6, platelets 281, lipase 34 -Potassium is down to 2.9 glucose is 128, sodium is 140 creatinine 0.99 with a BUN of 14, LFTs are not elevated -Magnesium is low at 1.4     Hospital Course:      1)Recurrent C. difficile Colitis----discussed with GI service -Patient was treated with oral vancomycin from 11/23/2020 to December 04, 2020 -She did well initially and now reporting increased frequency of watery stool and nausea with vomiting without bile or blood -C. difficile testing on 11/23/2020 showed positive antigen and positive toxin -C. difficile testing on 12/13/2020 with positive antigen, but Toxin negative PCR is Positive -GI service recommends slow vancomycin taper for over 6 weeks per protocol -Discharge on 6 week oral vancomycin taper please see discharge instructions for taper schedule   2)Hypokalemia/Hypomagnesemia----due to GI losses, compounded by  HCTZ use Normalized with replacement   3)HTN--okay to decrease Diovan HCTZ to half tablet   4)H/o DM2-last A1c 6.3, reflecting excellent DM control PTA -Dietary modifications encouraged   5)Sepsis secondary to C. difficile colitis --POA-- -patient with leukocytosis WBC 12.7 >> 5.2,  -Sepsis pathophysiology appears to have resolved   6)HLD----continue Lipitor   Disposition--home  Dispo: The patient is from: Home              Anticipated d/c is to: Home              Discharge Condition: stable  Follow UP   Follow-up Information     Lanelle Bal, DO. Schedule an appointment as soon as possible for a visit in 3 week(s).   Specialty: Gastroenterology Contact information: [address] [place] [address] [phone]                  Diet and Activity recommendation:   As advised  Discharge Instructions    Discharge Instructions     Call MD for:  difficulty breathing, headache or visual disturbances   Complete by: As directed    Call MD for:  persistant dizziness or light-headedness   Complete by: As directed    Call MD for:  persistant nausea and vomiting   Complete by: As directed    Call MD for:  severe uncontrolled pain   Complete by: As directed    Call MD for:  temperature >100.4   Complete by: As directed    Diet - low sodium heart healthy   Complete by: As directed    Discharge instructions   Complete by: As directed    1)Take oral Vancomycin 1 cap 4 times daily for 14 days, then 1 Cap twice daily for 7 days, then 1 cap Daily for 7 days, then 1 cap daily every other day for 1 week, then 1 cap 3 times a week for another week, then STOP  2)Follow-up Gastroenterologist Dr. Earnest Bailey with [facility]--- in 2 to 3 weeks for evaluation  -address: [address], [place], [place] [address], Phone: [phone]   Increase activity slowly   Complete by: As directed        Discharge Medications     Allergies as of 12/14/2020       Reactions   Amitriptyline    Drowsiness in the morning   Topamax [topiramate]    Memory loss, tingling in hands and feet        Medication List     STOP taking these medications    citalopram 10 MG tablet Commonly known as: CELEXA   omeprazole 20 MG capsule Commonly known as: PRILOSEC   rizatriptan 10 MG tablet Commonly known as: MAXALT       TAKE these medications    atorvastatin 10 MG tablet Commonly known as: LIPITOR Take 1 tablet (10 mg total) by mouth daily. Need f/u visit for additional refills   ondansetron 4 MG tablet Commonly known as: ZOFRAN Take 1 tablet (4 mg total) by mouth every 6 (six) hours as needed for nausea.   valsartan-hydrochlorothiazide 80-12.5 MG tablet Commonly known as: DIOVAN-HCT Take 0.5 tablets by mouth daily. For BP What  changed:  how much to take additional instructions   vancomycin 125 MG capsule Commonly known as: VANCOCIN Take 1 capsule (125 mg total) by mouth See admin instructions. Take oral Vancomycin 1 cap 4 times daily for 14 days, then 1 Cap twice daily for 7 days,  then 1 cap Daily for 7 days, then 1 cap daily every other day for 1 week, then 1 cap 3 times a week for another week, then STOP What changed:  when to take this additional instructions        Major procedures and Radiology Reports - PLEASE review detailed and final reports for all details, in brief -   CT Abdomen Pelvis W Contrast  Result Date: 12/13/2020 CLINICAL DATA:  Diverticulitis suspected; nausea and diarrhea, lower abdominal pain EXAM: CT ABDOMEN AND PELVIS WITH CONTRAST TECHNIQUE: Multidetector CT imaging of the abdomen and pelvis was performed using the standard protocol following bolus administration of intravenous contrast. CONTRAST:  OMNIPAQUE IOHEXOL 300 MG/ML  SOLN COMPARISON:  11/23/2020 FINDINGS: Lower chest: No acute abnormality. Hepatobiliary: No focal liver abnormality is seen. Status post cholecystectomy. No unexpected biliary dilatation. Pancreas: Unremarkable. Spleen: Unremarkable. Adrenals/Urinary Tract: Adrenals, kidneys, and bladder are unremarkable. Stomach/Bowel: Small hiatal hernia. Stomach is otherwise unremarkable. Bowel is normal in caliber and decompressed. Descending colon and sigmoid diverticulosis. Possible minor infiltration adjacent to the proximal sigmoid colon. Wall thickening is noted along much of the colon, greater on the left. Vascular/Lymphatic: Aortic atherosclerosis.  No enlarged lymph Reproductive: Uterus and bilateral adnexa are unremarkable. Other: No free fluid.  Abdominal wall is unremarkable. Musculoskeletal: Lower lumbar degenerative changes. No acute osseous abnormality IMPRESSION: Colonic wall thickening, some which may be related to underdistention. However,  infectious/inflammatory colitis is possible. There is also distal colonic diverticulosis with possible minimal infiltration of fat adjacent to the proximal sigmoid raising the possibility diverticulitis. Electronically Signed   By: Guadlupe Spanish M.D.   On: 12/13/2020 10:55   CT Abdomen Pelvis W Contrast  Result Date: 11/23/2020 CLINICAL DATA:  Infectious gastroenteritis or colitis EXAM: CT ABDOMEN AND PELVIS WITH CONTRAST TECHNIQUE: Multidetector CT imaging of the abdomen and pelvis was performed using the standard protocol following bolus administration of intravenous contrast. CONTRAST:  23mL OMNIPAQUE IOHEXOL 350 MG/ML SOLN COMPARISON:  10/31/2020 FINDINGS: Lower chest: Scattered small calcified granulomas within the lung bases. Lung bases are otherwise clear. Heart size is normal. Hepatobiliary: Mildly decreased attenuation of the hepatic parenchyma. No focal hepatic lesion is identified. Prior cholecystectomy. No intrahepatic biliary dilatation. Pancreas: Unremarkable. No pancreatic ductal dilatation or surrounding inflammatory changes. Spleen: Normal in size without focal abnormality. Adrenals/Urinary Tract: Unremarkable adrenal glands. Subcentimeter low-density lesions within the right kidney, too small to definitively characterize but most likely represent cysts. Kidneys appear otherwise unremarkable with symmetric enhancement. No renal stone or hydronephrosis. Urinary bladder appears unremarkable for the degree of distension. Stomach/Bowel: Small hiatal hernia. Stomach otherwise unremarkable. Oral contrast is present within the distal small bowel and colon. No dilated loops of bowel to suggest obstruction. There is mild diffuse wall thickening involving the colon. There are a few scattered left-sided diverticula. Vascular/Lymphatic: Scattered aortoiliac atherosclerotic calcifications without aneurysm. No abdominopelvic lymphadenopathy. Reproductive: Uterus and bilateral adnexa are unremarkable. Other:  No free fluid. No abdominopelvic fluid collection. No pneumoperitoneum. No abdominal wall hernia. Musculoskeletal: Degenerative disc disease at L5-S1. No acute osseous abnormality. Soft tissues appear within normal limits. IMPRESSION: 1. Mild diffuse wall thickening involving the colon suggestive of a nonspecific infectious or inflammatory colitis. 2. Mild left-sided colonic diverticulosis without evidence to suggest an acute diverticulitis. 3. Small hiatal hernia. 4. Hepatic steatosis. 5. Aortic atherosclerosis (ICD10-I70.0). Electronically Signed   By: Duanne Guess D.O.   On: 11/23/2020 13:41    Micro Results   Recent Results (from the past 240 hour(s))  C  Difficile Quick Screen w PCR reflex     Status: Abnormal   Collection Time: 12/13/20  7:50 AM   Specimen: STOOL  Result Value Ref Range Status   C Diff antigen POSITIVE (A) NEGATIVE Final   C Diff toxin NEGATIVE NEGATIVE Final   C Diff interpretation Results are indeterminate. See PCR results.  Final    Comment: Performed at [facility], [address], [place], [place] [address]  C. Diff by PCR, Reflexed     Status: Abnormal   Collection Time: 12/13/20  7:50 AM  Result Value Ref Range Status   Toxigenic C. Difficile by PCR POSITIVE (A) NEGATIVE Final    Comment: Positive for toxigenic C. difficile with little to no toxin production. Only treat if clinical presentation suggests symptomatic illness. Performed at [facility], [address], [place], [place] [address]   Resp Panel by RT-PCR (Flu A&B, Covid) Nasopharyngeal Swab     Status: None   Collection Time: 12/13/20  8:32 AM   Specimen: Nasopharyngeal Swab; Nasopharyngeal(NP) swabs in vial transport medium  Result Value Ref Range Status   SARS Coronavirus 2 by RT PCR NEGATIVE NEGATIVE Final    Comment: (NOTE) SARS-CoV-2 target nucleic acids are NOT DETECTED.  The SARS-CoV-2 RNA is generally detectable in upper respiratory specimens during the acute phase of infection.  The lowest concentration of SARS-CoV-2 viral copies this assay can detect is 138 copies/mL. A negative result does not preclude SARS-Cov-2 infection and should not be used as the sole basis for treatment or other patient management decisions. A negative result may occur with  improper specimen collection/handling, submission of specimen other than nasopharyngeal swab, presence of viral mutation(s) within the areas targeted by this assay, and inadequate number of viral copies(<138 copies/mL). A negative result must be combined with clinical observations, patient history, and epidemiological information. The expected result is Negative.  Fact Sheet for Patients:  BloggerCourse.com  Fact Sheet for Healthcare Providers:  SeriousBroker.it  This test is no t yet approved or cleared by the [place] FDA and  has been authorized for detection and/or diagnosis of SARS-CoV-2 by FDA under an Emergency Use Authorization (EUA). This EUA will remain  in effect (meaning this test can be used) for the duration of the COVID-19 declaration under Section 564(b)(1) of the Act, 21 U.S.C.section 360bbb-3(b)(1), unless the authorization is terminated  or revoked sooner.       Influenza A by PCR NEGATIVE NEGATIVE Final   Influenza B by PCR NEGATIVE NEGATIVE Final    Comment: (NOTE) The Xpert Xpress SARS-CoV-2/FLU/RSV plus assay is intended as an aid in the diagnosis of influenza from Nasopharyngeal swab specimens and should not be used as a sole basis for treatment. Nasal washings and aspirates are unacceptable for Xpert Xpress SARS-CoV-2/FLU/RSV testing.  Fact Sheet for Patients: BloggerCourse.com  Fact Sheet for Healthcare Providers: SeriousBroker.it  This test is not yet approved or cleared by the [place] FDA and has been authorized for detection and/or diagnosis of SARS-CoV-2 by FDA  under an Emergency Use Authorization (EUA). This EUA will remain in effect (meaning this test can be used) for the duration of the COVID-19 declaration under Section 564(b)(1) of the Act, 21 U.S.C. section 360bbb-3(b)(1), unless the authorization is terminated or revoked.  Performed at [facility], [address], [place], [place] [address]     Today   Subjective    Sheryl Lowe today has no complaints No fever  Or chills   No Nausea, Vomiting  or Diarrhea -Frequency and volume of stools have improved significantly          Patient has been seen and examined prior to discharge   Objective   Blood pressure (!) 91/53, pulse 97, temperature 98.8 F (37.1 C), temperature source Oral, resp. rate 18, height 5' (1.524 m), weight 69.2 kg, SpO2 93 %.   Intake/Output Summary (Last 24 hours) at 12/14/2020 1030 Last data filed at 12/14/2020 0600 Gross per 24 hour  Intake 5824.73 ml  Output --  Net 5824.73 ml    Exam Gen:- Awake Alert, no acute distress  HEENT:- [place].AT, No sclera icterus Neck-Supple Neck,No JVD,.  Lungs-  CTAB , good air movement bilaterally  CV- S1, S2 normal, regular Abd-  +ve B.Sounds, Abd Soft, No tenderness,    Extremity/Skin:- No  edema,   good pulses Psych-affect is appropriate, oriented x3 Neuro-no new focal deficits, no tremors    Data Review   CBC w Diff:  Lab Results  Component Value Date   WBC 5.2 12/14/2020   HGB 10.4 (L) 12/14/2020   HCT 31.8 (L) 12/14/2020   PLT 175 12/14/2020   LYMPHOPCT 18 12/13/2020   MONOPCT 12 12/13/2020   EOSPCT 1 12/13/2020   BASOPCT 1 12/13/2020    CMP:  Lab Results  Component Value Date   NA 142 12/14/2020   K 3.7 12/14/2020   CL 114 (H) 12/14/2020   CO2 22 12/14/2020   BUN 7 12/14/2020   CREATININE 0.83 12/14/2020   PROT 8.2 (H) 12/13/2020   ALBUMIN 4.0 12/13/2020   BILITOT 0.7 12/13/2020   ALKPHOS 82 12/13/2020   AST 21 12/13/2020   ALT 18 12/13/2020  .   Total Discharge time is about 33  minutes  Shon Hale M.D on 12/14/2020 at 10:30 AM  Go to [email or web address] -  for contact info  [facility] - Office  [phone]

## 2020-12-14 NOTE — Progress Notes (Signed)
Subjective: Abdominal pain improved. Stool frequency improving. Patient eager to go home.   Objective: Vital signs in last 24 hours: Temp:  [98.4 F (36.9 C)-100.7 F (38.2 C)] 98.8 F (37.1 C) (10/11 0419) Pulse Rate:  [95-131] 97 (10/11 0419) Resp:  [15-22] 18 (10/11 0207) BP: (91-139)/(53-93) 91/53 (10/11 0419) SpO2:  [92 %-100 %] 93 % (10/11 0419) Weight:  [69.2 kg] 69.2 kg (10/10 1349) Last BM Date: 12/14/20 General:   Alert and oriented, pleasant Head:  Normocephalic and atraumatic. Abdomen:  Bowel sounds present, soft, non-tender, non-distended. No HSM or hernias noted. No rebound or guarding. No masses appreciated  Msk:  Symmetrical without gross deformities. Normal posture. Extremities:  Without edema. Neurologic:  Alert and  oriented x4   Intake/Output from previous day: 10/10 0701 - 10/11 0700 In: 6824.7 [P.O.:560; I.V.:3780; IV Piggyback:2484.7] Out: -  Intake/Output this shift: No intake/output data recorded.  Lab Results: Recent Labs    12/13/20 0750 12/14/20 0548  WBC 12.7* 5.2  HGB 13.6 10.4*  HCT 41.1 31.8*  PLT 281 175   BMET Recent Labs    12/13/20 0750 12/14/20 0548  NA 140 142  K 2.9* 3.7  CL 104 114*  CO2 27 22  GLUCOSE 128* 100*  BUN 14 7  CREATININE 0.99 0.83  CALCIUM 9.1 8.5*   LFT Recent Labs    12/13/20 0750  PROT 8.2*  ALBUMIN 4.0  AST 21  ALT 18  ALKPHOS 82  BILITOT 0.7     Studies/Results: CT Abdomen Pelvis W Contrast  Result Date: 12/13/2020 CLINICAL DATA:  Diverticulitis suspected; nausea and diarrhea, lower abdominal pain EXAM: CT ABDOMEN AND PELVIS WITH CONTRAST TECHNIQUE: Multidetector CT imaging of the abdomen and pelvis was performed using the standard protocol following bolus administration of intravenous contrast. CONTRAST:  OMNIPAQUE IOHEXOL 300 MG/ML  SOLN COMPARISON:  11/23/2020 FINDINGS: Lower chest: No acute abnormality. Hepatobiliary: No focal liver abnormality is seen. Status post  cholecystectomy. No unexpected biliary dilatation. Pancreas: Unremarkable. Spleen: Unremarkable. Adrenals/Urinary Tract: Adrenals, kidneys, and bladder are unremarkable. Stomach/Bowel: Small hiatal hernia. Stomach is otherwise unremarkable. Bowel is normal in caliber and decompressed. Descending colon and sigmoid diverticulosis. Possible minor infiltration adjacent to the proximal sigmoid colon. Wall thickening is noted along much of the colon, greater on the left. Vascular/Lymphatic: Aortic atherosclerosis.  No enlarged lymph Reproductive: Uterus and bilateral adnexa are unremarkable. Other: No free fluid.  Abdominal wall is unremarkable. Musculoskeletal: Lower lumbar degenerative changes. No acute osseous abnormality IMPRESSION: Colonic wall thickening, some which may be related to underdistention. However, infectious/inflammatory colitis is possible. There is also distal colonic diverticulosis with possible minimal infiltration of fat adjacent to the proximal sigmoid raising the possibility diverticulitis. Electronically Signed   By: Guadlupe Spanish M.D.   On: 12/13/2020 10:55    Assessment/Plan: 60 y.o. year old very pleasant female with a history of Cdiff colitis 11/23/20 requiring admission 9/20-9/22 at [facility] after exposure to antibiotics for UTI, noting some improvement at home during 10 day course of vancomycin. Overall, frequency had improved but returned with recurrent malodorous diarrhea and fatigue with  Cdiff antigen positive, Cdiff toxin negative, and reflex to PCR positive. Non-severe presentation. CT with colonic wall thickening and unable to rule out sigmoid diverticulitis as well.  She has had improvement overnight with supportive measures and resuming oral vancomycin QID. Appropriate for discharge. Recommending oral vancomycin 125 mg QID for 14 days, then recommend taper with 125 mg orally BID for 7 days, then daily  for 7 days, then every 2-3 days for 2-8 weeks depending on response.  Holding off on Dificid as she responded well to vanc initially and likely has more of a refractory presentation. If no improvement on vanc, would need course of Dificid.   Long-standing history of chronic vomiting in setting of chronic GERD. Doing well while inpatient. PPI as outpatient.  Plans on EGD as outpatient.  She will need initial screening colonoscopy as outpatient once over acute illness.   Will arrange outpatient follow-up. Will schedule colonoscopy/EGD at that time.   Patient for discharge today.   Gelene Mink, PhD, ANP-BC [facility]        LOS: 1 day    12/14/2020, 9:34 AM

## 2020-12-14 NOTE — Telephone Encounter (Signed)
Sheryl Lowe, needs 4 week follow-up with Dr. Marletta Lor or me due to history of Cdiff.

## 2020-12-14 NOTE — Discharge Instructions (Signed)
1)Take oral Vancomycin 1 cap 4 times daily for 14 days, then 1 Cap twice daily for 7 days, then 1 cap Daily for 7 days, then 1 cap daily every other day for 1 week, then 1 cap 3 times a week for another week, then STOP  2)Follow-up Gastroenterologist Dr. Earnest Bailey with [facility]--- in 2 to 3 weeks for evaluation  -address: [address], [place], [place] [address], Phone: [phone]

## 2020-12-15 ENCOUNTER — Other Ambulatory Visit (HOSPITAL_COMMUNITY): Payer: Self-pay

## 2020-12-20 ENCOUNTER — Inpatient Hospital Stay (HOSPITAL_COMMUNITY): Payer: BC Managed Care – PPO | Admitting: Hematology

## 2020-12-20 ENCOUNTER — Other Ambulatory Visit (HOSPITAL_COMMUNITY): Payer: Self-pay

## 2020-12-22 ENCOUNTER — Telehealth: Payer: Self-pay | Admitting: Internal Medicine

## 2020-12-22 NOTE — Telephone Encounter (Signed)
Pt called again to follow up with Dr Queen Blossom nurse. I told her she would call as soon as she heard back from Dr Marletta Lor. [phone]

## 2020-12-22 NOTE — Telephone Encounter (Signed)
Patient saw dr Marletta Lor in the hospital and was put on an antibiotic.  She believes it has given her a uti, does she need to go have labd taken in order to have something sent in?  Please call her at [phone]

## 2020-12-22 NOTE — Telephone Encounter (Signed)
Phoned and advised the pt of recommendations to reach out to her PCP for follow-up regarding this matter. Pt expressed understanding

## 2020-12-22 NOTE — Telephone Encounter (Signed)
I would recommend that she reach out to her PCP in regards to this.  She likely needs urinalysis performed to rule out UTI.  Thank

## 2020-12-22 NOTE — Telephone Encounter (Signed)
noted 

## 2020-12-22 NOTE — Telephone Encounter (Signed)
Phoned and spoke with the pt and was advised she had the following symptoms to start about 4 days ago: urine that smells like ammonia, and it is cloudy. (Especially first thing in the morning). She has already urinated x4 already this morning. Symptoms are worse this am with lower back pain. Pt wants to know do you want her to go take urine specimen to PCP or what do you recommend she do. Please advise.

## 2020-12-23 ENCOUNTER — Telehealth: Payer: Self-pay

## 2020-12-23 ENCOUNTER — Telehealth: Payer: Self-pay | Admitting: Internal Medicine

## 2020-12-23 NOTE — Telephone Encounter (Signed)
Pt called again asking about her lab results and her vancomycin. I told her that I gave the nurse her lab results earlier and she is waiting on Dr Marletta Lor to address it. Meanwhile her PCP is on the other line being told the same.

## 2020-12-23 NOTE — Telephone Encounter (Signed)
Pt called back this morning , PCP stated she had a UTI and how to go by handling this because of her being on vancomycin. Please advise

## 2020-12-23 NOTE — Telephone Encounter (Signed)
Pt called again asking about her lab results and her vancomycin. I told her that I gave the nurse her lab results earlier and she is waiting on Dr Carver to address it. Meanwhile her PCP is on the other line being told the same.  

## 2020-12-23 NOTE — Telephone Encounter (Signed)
Already sent to [facility] x 3

## 2020-12-23 NOTE — Telephone Encounter (Signed)
Phoned Dr. Marletta Lor to advise of the situation

## 2020-12-23 NOTE — Telephone Encounter (Signed)
error 

## 2020-12-23 NOTE — Telephone Encounter (Signed)
Sent already.

## 2020-12-23 NOTE — Telephone Encounter (Signed)
I called and spoke with patient.  She reportedly has a urinary tract infection.  I counseled her that as long as she is on the vancomycin, okay for her primary care physician to put her on antibiotic for UTI.  Ideally we would like to avoid clindamycin going forward as this is what likely led to her C. difficile infection to begin with.  I offered prescription of Macrobid to patient.  I do not have her most recent culture data.  She does have a urine culture from 2020 that I can see that was sensitive to Macrobid.  She would like to hold off for now and states she will contact her PCP in the morning and let them know that I counseled her that it is okay to start antibiotic for UTI.  She will follow-up in our clinic as previously scheduled.

## 2020-12-23 NOTE — Telephone Encounter (Signed)
The pt's urine results are on the left side of your desk in the tray

## 2020-12-24 NOTE — Telephone Encounter (Signed)
noted 

## 2020-12-27 ENCOUNTER — Ambulatory Visit (HOSPITAL_BASED_OUTPATIENT_CLINIC_OR_DEPARTMENT_OTHER): Payer: BC Managed Care – PPO | Admitting: Hematology

## 2020-12-27 ENCOUNTER — Encounter (HOSPITAL_COMMUNITY): Payer: Self-pay

## 2020-12-27 DIAGNOSIS — D61818 Other pancytopenia: Secondary | ICD-10-CM | POA: Diagnosis not present

## 2020-12-27 NOTE — Progress Notes (Signed)
Virtual Visit via Telephone Note  I connected with Sheryl Lowe on 12/27/20 at  4:00 PM EDT by telephone and verified that I am speaking with the correct person using two identifiers.  Location: Patient: At home Provider: In the office   I discussed the limitations, risks, security and privacy concerns of performing an evaluation and management service by telephone and the availability of in person appointments. I also discussed with the patient that there may be a patient responsible charge related to this service. The patient expressed understanding and agreed to proceed.   History of Present Illness: She was evaluated by me for pancytopenia on 12/10/2020.  Since then she was again admitted to the hospital from 12/13/2020 through 12/14/2020 with recurrent C. difficile.   Observations/Objective: She reports that she is on a long course of oral vancomycin for C. difficile.  Denies any other fevers, night sweats or weight loss.  Assessment and Plan:  1.  Pancytopenia: - We have reviewed labs from 12/10/2020 which showed normal B12, folic acid, methylmalonic acid, LDH.  CBC completely improved to normal. - She again had some anemia with hemoglobin between 10 and 11 on 12/14/2020 at the time of discharge from the hospital.  Likely antibiotic and hydration induced. - As her pancytopenia has resolved, and she does not have any nutritional deficiencies, I have not given her follow-up visit.   Follow Up Instructions: RTC as needed.   I discussed the assessment and treatment plan with the patient. The patient was provided an opportunity to ask questions and all were answered. The patient agreed with the plan and demonstrated an understanding of the instructions.   The patient was advised to call back or seek an in-person evaluation if the symptoms worsen or if the condition fails to improve as anticipated.  I provided 11 minutes of non-face-to-face time during this encounter.   Doreatha Massed, MD

## 2021-01-05 ENCOUNTER — Encounter: Payer: Self-pay | Admitting: Internal Medicine

## 2021-01-05 ENCOUNTER — Other Ambulatory Visit: Payer: Self-pay

## 2021-01-05 ENCOUNTER — Ambulatory Visit: Payer: BC Managed Care – PPO | Admitting: Internal Medicine

## 2021-01-05 VITALS — BP 136/93 | HR 108 | Temp 97.5°F | Ht 61.0 in | Wt 152.2 lb

## 2021-01-05 DIAGNOSIS — K219 Gastro-esophageal reflux disease without esophagitis: Secondary | ICD-10-CM | POA: Diagnosis not present

## 2021-01-05 DIAGNOSIS — A0472 Enterocolitis due to Clostridium difficile, not specified as recurrent: Secondary | ICD-10-CM | POA: Diagnosis not present

## 2021-01-05 DIAGNOSIS — R112 Nausea with vomiting, unspecified: Secondary | ICD-10-CM | POA: Diagnosis not present

## 2021-01-05 DIAGNOSIS — K5732 Diverticulitis of large intestine without perforation or abscess without bleeding: Secondary | ICD-10-CM | POA: Diagnosis not present

## 2021-01-05 NOTE — Progress Notes (Signed)
Referring Provider: Pollyann Savoy, NP Primary Care Physician:  Pollyann Savoy, NP Primary GI:  Dr. Marletta Lor  Chief Complaint  Patient presents with   C. Diff    Started vancomycin daily yesterday. Stool issues are better. Having some fatigue and back pain    HPI:   Sheryl Lowe is a 60 y.o. female who presents to clinic today for hospital follow-up visit.  She was admitted to [facility] 12/13/2020 for abdominal pain and diarrhea.  CT imaging at that time showed diffuse colitis as well as sigmoid diverticulitis.  She had just completed a course of 10 days of p.o. vancomycin for C. difficile with improvement in her symptoms and then they returned.  C. difficile likely induced from antibiotics for recurrent UTIs, most notably clindamycin.  She was started on an extended vancomycin taper.  Today she states she continues to take the vancomycin is feeling much better.  Having formed stools.  No abdominal pain.  No previous colonoscopy.  Does note chronic indigestion as well as nausea with occasional vomiting and GERD.  No dysphagia odynophagia.  Past Medical History:  Diagnosis Date   Anxiety    Clostridium difficile infection    Hyperlipidemia    Hypertension    Kidney stones    Migraine     Past Surgical History:  Procedure Laterality Date   CHOLECYSTECTOMY  2020    Current Outpatient Medications  Medication Sig Dispense Refill   atorvastatin (LIPITOR) 10 MG tablet Take 1 tablet (10 mg total) by mouth daily. Need f/u visit for additional refills 30 tablet 0   omeprazole (PRILOSEC) 20 MG capsule Take 20 mg by mouth daily.     valsartan-hydrochlorothiazide (DIOVAN-HCT) 80-12.5 MG tablet Take 1/2 tablet by mouth daily for blood pressure 15 tablet 1   vancomycin (VANCOCIN) 125 MG capsule Take 1 capsule 4 times daily for 14 days, 1 cap twice daily x 7 days, 1 cap daily x 7 days, 1 cap every other day x 1 week, 1 cap 3 times a week for another week, then STOP 88 capsule 0    ondansetron (ZOFRAN) 4 MG tablet Take 1 tablet by mouth every 6 (six) hours as needed for nausea. (Patient not taking: No sig reported) 20 tablet 0   No current facility-administered medications for this visit.    Allergies as of 01/05/2021 - Review Complete 01/05/2021  Allergen Reaction Noted   Amitriptyline  10/18/2015   Topamax [topiramate]  10/18/2015    Family History  Problem Relation Age of Onset   Stroke Mother    Heart disease Mother    Hypertension Father    Cancer Father    Colon cancer Neg Hx    Colon polyps Neg Hx     Social History   Socioeconomic History   Marital status: Married    Spouse name: Not on file   Number of children: Not on file   Years of education: Not on file   Highest education level: Not on file  Occupational History   Not on file  Tobacco Use   Smoking status: Never   Smokeless tobacco: Never  Vaping Use   Vaping Use: Never used  Substance and Sexual Activity   Alcohol use: Not Currently   Drug use: No   Sexual activity: Not on file  Other Topics Concern   Not on file  Social History Narrative   Not on file   Social Determinants of Health   Financial Resource Strain: Not  on file  Food Insecurity: Not on file  Transportation Needs: Not on file  Physical Activity: Not on file  Stress: Not on file  Social Connections: Not on file    Subjective: Review of Systems  Constitutional:  Negative for chills and fever.  HENT:  Negative for congestion and hearing loss.   Eyes:  Negative for blurred vision and double vision.  Respiratory:  Negative for cough and shortness of breath.   Cardiovascular:  Negative for chest pain and palpitations.  Gastrointestinal:  Negative for abdominal pain, blood in stool, constipation, diarrhea, heartburn, melena and vomiting.  Genitourinary:  Negative for dysuria and urgency.  Musculoskeletal:  Negative for joint pain and myalgias.  Skin:  Negative for itching and rash.  Neurological:  Negative  for dizziness and headaches.  Psychiatric/Behavioral:  Negative for depression. The patient is not nervous/anxious.     Objective: BP (!) 136/93   Pulse (!) 108   Temp (!) 97.5 F (36.4 C) (Temporal)   Ht 5\' 1"  (1.549 m)   Wt 152 lb 3.2 oz (69 kg)   BMI 28.76 kg/m  Physical Exam Constitutional:      Appearance: Normal appearance.  HENT:     Head: Normocephalic and atraumatic.  Eyes:     Extraocular Movements: Extraocular movements intact.     Conjunctiva/sclera: Conjunctivae normal.  Cardiovascular:     Rate and Rhythm: Normal rate and regular rhythm.  Pulmonary:     Effort: Pulmonary effort is normal.     Breath sounds: Normal breath sounds.  Abdominal:     General: Bowel sounds are normal.     Palpations: Abdomen is soft.  Musculoskeletal:        General: No swelling. Normal range of motion.     Cervical back: Normal range of motion and neck supple.  Skin:    General: Skin is warm and dry.     Coloration: Skin is not jaundiced.  Neurological:     General: No focal deficit present.     Mental Status: She is alert and oriented to person, place, and time.  Psychiatric:        Mood and Affect: Mood normal.        Behavior: Behavior normal.     Assessment: *C. difficile colitis *Diarrhea-resolved *GERD *Nausea with occasional vomiting  Plan: From a GI perspective, patient appears to be much improved today.  No longer having diarrhea.  Continue on extended vancomycin taper to completion.  Given the refractory nature of her C. difficile infection, she is at high risk for recurrence.  If she needs antibiotics in the future for other infections, I have recommended that she take prophylactic vancomycin 125 daily up until 5 days after completion of antibiotic.  We will schedule her for colonoscopy given her colitis and diverticulitis on CT imaging during hospitalization.  At the same time we will perform EGD for her chronic indigestion and nausea with occasional  vomiting.  The risks including infection, bleed, or perforation as well as benefits, limitations, alternatives and imponderables have been reviewed with the patient. Potential for esophageal dilation, biopsy, etc. have also been reviewed.  Questions have been answered. All parties agreeable.     01/05/2021 11:19 AM   Disclaimer: This note was dictated with voice recognition software. Similar sounding words can inadvertently be transcribed and may not be corrected upon review.

## 2021-01-05 NOTE — H&P (View-Only) (Signed)
Referring Provider: Pollyann Savoy, NP Primary Care Physician:  Pollyann Savoy, NP Primary GI:  Dr. Marletta Lor  Chief Complaint  Patient presents with   C. Diff    Started vancomycin daily yesterday. Stool issues are better. Having some fatigue and back pain    HPI:   Sheryl Lowe is a 60 y.o. female who presents to clinic today for hospital follow-up visit.  She was admitted to [facility] 12/13/2020 for abdominal pain and diarrhea.  CT imaging at that time showed diffuse colitis as well as sigmoid diverticulitis.  She had just completed a course of 10 days of p.o. vancomycin for C. difficile with improvement in her symptoms and then they returned.  C. difficile likely induced from antibiotics for recurrent UTIs, most notably clindamycin.  She was started on an extended vancomycin taper.  Today she states she continues to take the vancomycin is feeling much better.  Having formed stools.  No abdominal pain.  No previous colonoscopy.  Does note chronic indigestion as well as nausea with occasional vomiting and GERD.  No dysphagia odynophagia.  Past Medical History:  Diagnosis Date   Anxiety    Clostridium difficile infection    Hyperlipidemia    Hypertension    Kidney stones    Migraine     Past Surgical History:  Procedure Laterality Date   CHOLECYSTECTOMY  2020    Current Outpatient Medications  Medication Sig Dispense Refill   atorvastatin (LIPITOR) 10 MG tablet Take 1 tablet (10 mg total) by mouth daily. Need f/u visit for additional refills 30 tablet 0   omeprazole (PRILOSEC) 20 MG capsule Take 20 mg by mouth daily.     valsartan-hydrochlorothiazide (DIOVAN-HCT) 80-12.5 MG tablet Take 1/2 tablet by mouth daily for blood pressure 15 tablet 1   vancomycin (VANCOCIN) 125 MG capsule Take 1 capsule 4 times daily for 14 days, 1 cap twice daily x 7 days, 1 cap daily x 7 days, 1 cap every other day x 1 week, 1 cap 3 times a week for another week, then STOP 88 capsule 0    ondansetron (ZOFRAN) 4 MG tablet Take 1 tablet by mouth every 6 (six) hours as needed for nausea. (Patient not taking: No sig reported) 20 tablet 0   No current facility-administered medications for this visit.    Allergies as of 01/05/2021 - Review Complete 01/05/2021  Allergen Reaction Noted   Amitriptyline  10/18/2015   Topamax [topiramate]  10/18/2015    Family History  Problem Relation Age of Onset   Stroke Mother    Heart disease Mother    Hypertension Father    Cancer Father    Colon cancer Neg Hx    Colon polyps Neg Hx     Social History   Socioeconomic History   Marital status: Married    Spouse name: Not on file   Number of children: Not on file   Years of education: Not on file   Highest education level: Not on file  Occupational History   Not on file  Tobacco Use   Smoking status: Never   Smokeless tobacco: Never  Vaping Use   Vaping Use: Never used  Substance and Sexual Activity   Alcohol use: Not Currently   Drug use: No   Sexual activity: Not on file  Other Topics Concern   Not on file  Social History Narrative   Not on file   Social Determinants of Health   Financial Resource Strain: Not  on file  Food Insecurity: Not on file  Transportation Needs: Not on file  Physical Activity: Not on file  Stress: Not on file  Social Connections: Not on file    Subjective: Review of Systems  Constitutional:  Negative for chills and fever.  HENT:  Negative for congestion and hearing loss.   Eyes:  Negative for blurred vision and double vision.  Respiratory:  Negative for cough and shortness of breath.   Cardiovascular:  Negative for chest pain and palpitations.  Gastrointestinal:  Negative for abdominal pain, blood in stool, constipation, diarrhea, heartburn, melena and vomiting.  Genitourinary:  Negative for dysuria and urgency.  Musculoskeletal:  Negative for joint pain and myalgias.  Skin:  Negative for itching and rash.  Neurological:  Negative  for dizziness and headaches.  Psychiatric/Behavioral:  Negative for depression. The patient is not nervous/anxious.     Objective: BP (!) 136/93   Pulse (!) 108   Temp (!) 97.5 F (36.4 C) (Temporal)   Ht 5\' 1"  (1.549 m)   Wt 152 lb 3.2 oz (69 kg)   BMI 28.76 kg/m  Physical Exam Constitutional:      Appearance: Normal appearance.  HENT:     Head: Normocephalic and atraumatic.  Eyes:     Extraocular Movements: Extraocular movements intact.     Conjunctiva/sclera: Conjunctivae normal.  Cardiovascular:     Rate and Rhythm: Normal rate and regular rhythm.  Pulmonary:     Effort: Pulmonary effort is normal.     Breath sounds: Normal breath sounds.  Abdominal:     General: Bowel sounds are normal.     Palpations: Abdomen is soft.  Musculoskeletal:        General: No swelling. Normal range of motion.     Cervical back: Normal range of motion and neck supple.  Skin:    General: Skin is warm and dry.     Coloration: Skin is not jaundiced.  Neurological:     General: No focal deficit present.     Mental Status: She is alert and oriented to person, place, and time.  Psychiatric:        Mood and Affect: Mood normal.        Behavior: Behavior normal.     Assessment: *C. difficile colitis *Diarrhea-resolved *GERD *Nausea with occasional vomiting  Plan: From a GI perspective, patient appears to be much improved today.  No longer having diarrhea.  Continue on extended vancomycin taper to completion.  Given the refractory nature of her C. difficile infection, she is at high risk for recurrence.  If she needs antibiotics in the future for other infections, I have recommended that she take prophylactic vancomycin 125 daily up until 5 days after completion of antibiotic.  We will schedule her for colonoscopy given her colitis and diverticulitis on CT imaging during hospitalization.  At the same time we will perform EGD for her chronic indigestion and nausea with occasional  vomiting.  The risks including infection, bleed, or perforation as well as benefits, limitations, alternatives and imponderables have been reviewed with the patient. Potential for esophageal dilation, biopsy, etc. have also been reviewed.  Questions have been answered. All parties agreeable.     01/05/2021 11:19 AM   Disclaimer: This note was dictated with voice recognition software. Similar sounding words can inadvertently be transcribed and may not be corrected upon review.

## 2021-01-05 NOTE — Patient Instructions (Signed)
I am happy to hear that you are doing better from a C. difficile perspective.  Continue oral vancomycin until completion.  If you need antibiotics in the future for other infections, I would recommend that you also go on prophylactic vancomycin dosing.  The prophylactic dosing is 125 mg daily oral vancomycin while on other antibiotic extended to 5 days after completion of antibiotic course.    We will schedule you for colonoscopy to evaluate your most recent episode of diverticulitis.  At the same time we will perform upper endoscopy for your chronic indigestion and nausea.  Is great seeing you again today.  Dr. Marletta Lor  At [facility] we value your feedback. You may receive a survey about your visit today. Please share your experience as we strive to create trusting relationships with our patients to provide genuine, compassionate, quality care.  We appreciate your understanding and patience as we review any laboratory studies, imaging, and other diagnostic tests that are ordered as we care for you. Our office policy is 5 business days for review of these results, and any emergent or urgent results are addressed in a timely manner for your best interest. If you do not hear from our office in 1 week, please contact us.   We also encourage the use of MyChart, which contains your medical information for your review as well. If you are not enrolled in this feature, an access code is on this after visit summary for your convenience. Thank you for allowing [place] to be involved in your care.  It was great to see you today!  I hope you have a great rest of your Fall!    Hennie Duos. Marletta Lor, D.O. Gastroenterology and Hepatology [facility]

## 2021-01-07 ENCOUNTER — Telehealth: Payer: Self-pay

## 2021-01-07 NOTE — Telephone Encounter (Signed)
Called pt to schedule TCS/EGD w/Propofol ASA 2 w/Dr. Marletta Lor. She wants to wait until 2nd week of January to have procedure. Informed her we will call back to schedule when January schedule is available.

## 2021-01-12 ENCOUNTER — Telehealth: Payer: Self-pay

## 2021-01-12 NOTE — Telephone Encounter (Signed)
Received patient records requesting a transfer of care. Called patient to ask if she has a preferred provider but she was at work, she said she would call back.

## 2021-01-17 ENCOUNTER — Telehealth: Payer: Self-pay | Admitting: Internal Medicine

## 2021-01-17 DIAGNOSIS — K5732 Diverticulitis of large intestine without perforation or abscess without bleeding: Secondary | ICD-10-CM

## 2021-01-17 DIAGNOSIS — R112 Nausea with vomiting, unspecified: Secondary | ICD-10-CM

## 2021-01-17 DIAGNOSIS — K219 Gastro-esophageal reflux disease without esophagitis: Secondary | ICD-10-CM

## 2021-01-17 DIAGNOSIS — A0472 Enterocolitis due to Clostridium difficile, not specified as recurrent: Secondary | ICD-10-CM

## 2021-01-17 MED ORDER — PEG 3350-KCL-NA BICARB-NACL 420 G PO SOLR: ORAL | 0 refills | Status: DC

## 2021-01-17 NOTE — Telephone Encounter (Signed)
Called pt. She wanted to proceed with scheduling TCS/EGD with Dr. Marletta Lor. Reports she did not request to transfer care.  Patient scheduled for 12/2 at 7:30am. Aware will mail prep instructions and will send Rx to pharmacy. Confirmed address.   PA submitted via [employer] for TCS. Auth approved. [id number]  , DOS: 01/17/2021 - 03/17/2021 PA submitted via [employer]IM for EGD. Auth approved. [id number], DOS 01/17/2021 - 03/17/2021

## 2021-01-17 NOTE — Addendum Note (Signed)
Addended by: Armstead Peaks on: 01/17/2021 09:11 AM   Modules accepted: Orders

## 2021-01-17 NOTE — Telephone Encounter (Signed)
PATIENT CALLED AND SAID SHE WOULD LIKE TO GO AHEAD WITH HER COLONOSCOPY SCHEDULING

## 2021-02-02 ENCOUNTER — Other Ambulatory Visit (HOSPITAL_COMMUNITY)
Admission: RE | Admit: 2021-02-02 | Discharge: 2021-02-02 | Disposition: A | Discharge status: Home / Self Care | Payer: BC Managed Care – PPO | Source: Ambulatory Visit | Attending: Internal Medicine | Admitting: Internal Medicine

## 2021-02-02 DIAGNOSIS — A0472 Enterocolitis due to Clostridium difficile, not specified as recurrent: Secondary | ICD-10-CM | POA: Insufficient documentation

## 2021-02-02 DIAGNOSIS — I10 Essential (primary) hypertension: Secondary | ICD-10-CM | POA: Diagnosis not present

## 2021-02-02 DIAGNOSIS — Z09 Encounter for follow-up examination after completed treatment for conditions other than malignant neoplasm: Secondary | ICD-10-CM | POA: Diagnosis present

## 2021-02-02 DIAGNOSIS — K5732 Diverticulitis of large intestine without perforation or abscess without bleeding: Secondary | ICD-10-CM

## 2021-02-02 DIAGNOSIS — K30 Functional dyspepsia: Secondary | ICD-10-CM | POA: Diagnosis not present

## 2021-02-02 DIAGNOSIS — R112 Nausea with vomiting, unspecified: Secondary | ICD-10-CM

## 2021-02-02 DIAGNOSIS — K2970 Gastritis, unspecified, without bleeding: Secondary | ICD-10-CM | POA: Diagnosis not present

## 2021-02-02 DIAGNOSIS — K648 Other hemorrhoids: Secondary | ICD-10-CM | POA: Diagnosis not present

## 2021-02-02 DIAGNOSIS — D759 Disease of blood and blood-forming organs, unspecified: Secondary | ICD-10-CM | POA: Diagnosis not present

## 2021-02-02 DIAGNOSIS — K219 Gastro-esophageal reflux disease without esophagitis: Secondary | ICD-10-CM | POA: Insufficient documentation

## 2021-02-02 DIAGNOSIS — R519 Headache, unspecified: Secondary | ICD-10-CM | POA: Diagnosis not present

## 2021-02-02 DIAGNOSIS — D649 Anemia, unspecified: Secondary | ICD-10-CM | POA: Diagnosis not present

## 2021-02-02 DIAGNOSIS — K3189 Other diseases of stomach and duodenum: Secondary | ICD-10-CM | POA: Diagnosis not present

## 2021-02-02 DIAGNOSIS — E119 Type 2 diabetes mellitus without complications: Secondary | ICD-10-CM | POA: Diagnosis not present

## 2021-02-02 DIAGNOSIS — K2289 Other specified disease of esophagus: Secondary | ICD-10-CM | POA: Diagnosis not present

## 2021-02-02 DIAGNOSIS — Z792 Long term (current) use of antibiotics: Secondary | ICD-10-CM | POA: Diagnosis not present

## 2021-02-02 DIAGNOSIS — K449 Diaphragmatic hernia without obstruction or gangrene: Secondary | ICD-10-CM | POA: Diagnosis not present

## 2021-02-02 LAB — BASIC METABOLIC PANEL
Anion gap: 8 (ref 5–15)
BUN: 21 mg/dL — ABNORMAL HIGH (ref 6–20)
CO2: 28 mmol/L (ref 22–32)
Calcium: 9.7 mg/dL (ref 8.9–10.3)
Chloride: 104 mmol/L (ref 98–111)
Creatinine, Ser: 0.84 mg/dL (ref 0.44–1.00)
GFR, Estimated: >60 mL/min (ref >60)
Glucose, Bld: 110 mg/dL — ABNORMAL HIGH (ref 70–99)
Potassium: 3.2 mmol/L — ABNORMAL LOW (ref 3.5–5.1)
Sodium: 140 mmol/L (ref 135–145)

## 2021-02-03 NOTE — Progress Notes (Signed)
Dr. Johnnette Litter notified of potassium of 3.2. Ok to proceed with procedure. Dr. Marletta Lor made aware.

## 2021-02-04 ENCOUNTER — Ambulatory Visit (HOSPITAL_COMMUNITY): Payer: BC Managed Care – PPO | Admitting: Certified Registered Nurse Anesthetist

## 2021-02-04 ENCOUNTER — Other Ambulatory Visit: Payer: Self-pay

## 2021-02-04 ENCOUNTER — Encounter (HOSPITAL_COMMUNITY): Payer: Self-pay

## 2021-02-04 ENCOUNTER — Encounter (HOSPITAL_COMMUNITY)
Admission: RE | Disposition: A | Discharge status: Home / Self Care | Payer: Self-pay | Source: Home / Self Care | Attending: Internal Medicine

## 2021-02-04 ENCOUNTER — Ambulatory Visit (HOSPITAL_COMMUNITY)
Admission: RE | Admit: 2021-02-04 | Discharge: 2021-02-04 | Disposition: A | Discharge status: Home / Self Care | Payer: BC Managed Care – PPO | Attending: Internal Medicine | Admitting: Internal Medicine

## 2021-02-04 DIAGNOSIS — K219 Gastro-esophageal reflux disease without esophagitis: Secondary | ICD-10-CM | POA: Insufficient documentation

## 2021-02-04 DIAGNOSIS — K2970 Gastritis, unspecified, without bleeding: Secondary | ICD-10-CM | POA: Diagnosis not present

## 2021-02-04 DIAGNOSIS — K648 Other hemorrhoids: Secondary | ICD-10-CM | POA: Insufficient documentation

## 2021-02-04 DIAGNOSIS — K30 Functional dyspepsia: Secondary | ICD-10-CM | POA: Insufficient documentation

## 2021-02-04 DIAGNOSIS — R112 Nausea with vomiting, unspecified: Secondary | ICD-10-CM | POA: Insufficient documentation

## 2021-02-04 DIAGNOSIS — K3189 Other diseases of stomach and duodenum: Secondary | ICD-10-CM | POA: Insufficient documentation

## 2021-02-04 DIAGNOSIS — D759 Disease of blood and blood-forming organs, unspecified: Secondary | ICD-10-CM | POA: Insufficient documentation

## 2021-02-04 DIAGNOSIS — A0472 Enterocolitis due to Clostridium difficile, not specified as recurrent: Secondary | ICD-10-CM | POA: Insufficient documentation

## 2021-02-04 DIAGNOSIS — K449 Diaphragmatic hernia without obstruction or gangrene: Secondary | ICD-10-CM | POA: Insufficient documentation

## 2021-02-04 DIAGNOSIS — R519 Headache, unspecified: Secondary | ICD-10-CM | POA: Insufficient documentation

## 2021-02-04 DIAGNOSIS — D649 Anemia, unspecified: Secondary | ICD-10-CM | POA: Insufficient documentation

## 2021-02-04 DIAGNOSIS — I10 Essential (primary) hypertension: Secondary | ICD-10-CM | POA: Insufficient documentation

## 2021-02-04 DIAGNOSIS — K2289 Other specified disease of esophagus: Secondary | ICD-10-CM | POA: Insufficient documentation

## 2021-02-04 DIAGNOSIS — Z792 Long term (current) use of antibiotics: Secondary | ICD-10-CM | POA: Insufficient documentation

## 2021-02-04 DIAGNOSIS — K5732 Diverticulitis of large intestine without perforation or abscess without bleeding: Secondary | ICD-10-CM | POA: Diagnosis not present

## 2021-02-04 DIAGNOSIS — E119 Type 2 diabetes mellitus without complications: Secondary | ICD-10-CM | POA: Insufficient documentation

## 2021-02-04 DIAGNOSIS — Z09 Encounter for follow-up examination after completed treatment for conditions other than malignant neoplasm: Secondary | ICD-10-CM | POA: Insufficient documentation

## 2021-02-04 HISTORY — PX: ESOPHAGOGASTRODUODENOSCOPY (EGD) WITH PROPOFOL: SHX5813

## 2021-02-04 HISTORY — PX: BIOPSY: SHX5522

## 2021-02-04 HISTORY — PX: COLONOSCOPY WITH PROPOFOL: SHX5780

## 2021-02-04 SURGERY — COLONOSCOPY WITH PROPOFOL
Anesthesia: General

## 2021-02-04 MED ORDER — PROPOFOL 500 MG/50ML IV EMUL
INTRAVENOUS | Status: DC | PRN
  Administered 2021-02-04: 100 mg via INTRAVENOUS
  Administered 2021-02-04: 150 ug/kg/min via INTRAVENOUS

## 2021-02-04 MED ORDER — LIDOCAINE HCL (PF) 2 % IJ SOLN
INTRAMUSCULAR | Status: AC
  Filled 2021-02-04: qty 20

## 2021-02-04 MED ORDER — OMEPRAZOLE 20 MG PO CPDR: 20.0000 mg | DELAYED_RELEASE_CAPSULE | Freq: Two times a day (BID) | ORAL | 5 refills | Status: DC

## 2021-02-04 MED ORDER — EPHEDRINE 5 MG/ML INJ
INTRAVENOUS | Status: AC
  Filled 2021-02-04: qty 5

## 2021-02-04 MED ORDER — LIDOCAINE HCL (CARDIAC) PF 100 MG/5ML IV SOSY
PREFILLED_SYRINGE | INTRAVENOUS | Status: DC | PRN
  Administered 2021-02-04: 50 mg via INTRAVENOUS

## 2021-02-04 MED ORDER — LACTATED RINGERS IV SOLN: INTRAVENOUS | Status: DC

## 2021-02-04 NOTE — Op Note (Addendum)
[facility] Patient Name: Sheryl Lowe Procedure Date: 02/04/2021 7:12 AM MRN: [id number] Date of Birth: Oct 02, 1960 Attending MD: Elon Alas. Abbey Chatters DO CSN: [id number] Age: 60 Admit Type: Outpatient Procedure:                Upper GI endoscopy Indications:              Heartburn, Abdominal bloating, Nausea with vomiting Providers:                Elon Alas. Abbey Chatters, DO, Jessica Boudreaux, Randa Spike, Technician Referring MD:              Medicines:                See the Anesthesia note for documentation of the                            administered medications Complications:            No immediate complications. Estimated Blood Loss:     Estimated blood loss was minimal. Procedure:                Pre-Anesthesia Assessment:                           - The anesthesia plan was to use monitored                            anesthesia care (MAC).                           After obtaining informed consent, the endoscope was                            passed under direct vision. Throughout the                            procedure, the patient's blood pressure, pulse, and                            oxygen saturations were monitored continuously. The                            [id number]-H190 ([id number]) scope was introduced through the                            mouth, and advanced to the second part of duodenum.                            The upper GI endoscopy was accomplished without                            difficulty. The patient tolerated the procedure                            well. Scope In:  7:42:04 AM Scope Out: 7:46:45 AM Total Procedure Duration: 0 hours 4 minutes 41 seconds  Findings:      The esophagus and gastroesophageal junction were examined with white       light and narrow band imaging (NBI) from a forward view and retroflexed       position. There were esophageal mucosal changes suspicious for       short-segment Barrett's esophagus. These  changes involved the mucosa at       the upper extent of the gastric folds (33 cm from the incisors)       extending to the Z-line (30 cm from the incisors). Circumferential       salmon-colored mucosa was present. The maximum longitudinal extent of       these esophageal mucosal changes was 3 cm in length. This was biopsied       with a cold forceps for histology.      Localized mild inflammation characterized by erosions and erythema was       found in the gastric antrum. Biopsies were taken with a cold forceps for       Helicobacter pylori testing.      The duodenal bulb, first portion of the duodenum and second portion of       the duodenum were normal.      A small hiatal hernia was present. Impression:               - Esophageal mucosal changes suspicious for                            short-segment Barrett's esophagus. Biopsied.                           - Gastritis. Biopsied.                           - Normal duodenal bulb, first portion of the                            duodenum and second portion of the duodenum.                           - Small hiatal hernia. Moderate Sedation:      Per Anesthesia Care Recommendation:           - Patient has a contact number available for                            emergencies. The signs and symptoms of potential                            delayed complications were discussed with the                            patient. Return to normal activities tomorrow.                            Written discharge instructions were provided to the  patient.                           - Resume previous diet.                           - Continue present medications.                           - Await pathology results.                           - Use a proton pump inhibitor PO BID for 10-12                            weeks then decrease down to once daily.                           - No ibuprofen, naproxen, or other non-steroidal                             anti-inflammatory drugs. Procedure Code(s):        --- Professional ---                           [phone], Esophagogastroduodenoscopy, flexible,                            transoral; with biopsy, single or multiple Diagnosis Code(s):        --- Professional ---                           K22.8, Other specified diseases of esophagus                           K29.70, Gastritis, unspecified, without bleeding                           R12, Heartburn                           R14.0, Abdominal distension (gaseous)                           R11.2, Nausea with vomiting, unspecified CPT copyright 2019 American Medical Association. All rights reserved. The codes documented in this report are preliminary and upon coder review may  be revised to meet current compliance requirements. Elon Alas. Abbey Chatters, DO [place] Abbey Chatters, DO 02/04/2021 7:50:07 AM This report has been signed electronically. Number of Addenda: 0

## 2021-02-04 NOTE — Anesthesia Preprocedure Evaluation (Signed)
Anesthesia Evaluation  Patient identified by MRN, date of birth, ID band Patient awake    Reviewed: Allergy & Precautions, H&P , NPO status , Patient's Chart, lab work & pertinent test results, reviewed documented beta blocker date and time   Airway Mallampati: II  TM Distance: >3 FB Neck ROM: full    Dental no notable dental hx.    Pulmonary neg pulmonary ROS,    Pulmonary exam normal breath sounds clear to auscultation       Cardiovascular Exercise Tolerance: Good hypertension, negative cardio ROS   Rhythm:regular Rate:Normal     Neuro/Psych  Headaches, PSYCHIATRIC DISORDERS Anxiety    GI/Hepatic Neg liver ROS, GERD  Medicated,  Endo/Other  diabetes, Type 2  Renal/GU Renal disease  negative genitourinary   Musculoskeletal   Abdominal   Peds  Hematology  (+) Blood dyscrasia, anemia ,   Anesthesia Other Findings   Reproductive/Obstetrics negative OB ROS                             Anesthesia Physical Anesthesia Plan  ASA: 2  Anesthesia Plan: General   Post-op Pain Management:    Induction:   PONV Risk Score and Plan: Propofol infusion  Airway Management Planned:   Additional Equipment:   Intra-op Plan:   Post-operative Plan:   Informed Consent: I have reviewed the patients History and Physical, chart, labs and discussed the procedure including the risks, benefits and alternatives for the proposed anesthesia with the patient or authorized representative who has indicated his/her understanding and acceptance.     Dental Advisory Given  Plan Discussed with: CRNA  Anesthesia Plan Comments:         Anesthesia Quick Evaluation

## 2021-02-04 NOTE — Op Note (Signed)
[facility] Patient Name: Sheryl Lowe Procedure Date: 02/04/2021 7:49 AM MRN: [id number] Date of Birth: [birth year] Attending MD: Elon Alas. Abbey Chatters DO CSN: [id number] Age: 60 Admit Type: Outpatient Procedure:                Colonoscopy Indications:              Follow-up of diverticulitis Providers:                Elon Alas. Abbey Chatters, DO, Jessica Boudreaux, Randa Spike, Technician Referring MD:              Medicines:                See the Anesthesia note for documentation of the                            administered medications Complications:            No immediate complications. Estimated Blood Loss:     Estimated blood loss: none. Procedure:                Pre-Anesthesia Assessment:                           - The anesthesia plan was to use monitored                            anesthesia care (MAC).                           After obtaining informed consent, the colonoscope                            was passed under direct vision. Throughout the                            procedure, the patient's blood pressure, pulse, and                            oxygen saturations were monitored continuously. The                            [id number]-HQ190L ([id number]) scope was introduced through                            the anus and advanced to the the cecum, identified                            by appendiceal orifice and ileocecal valve. The                            colonoscopy was performed without difficulty. The                            patient tolerated the procedure well. The quality  of the bowel preparation was evaluated using the                            BBPS [facility] Bowel Preparation Scale) with scores                            of: Right Colon = 2 (minor amount of residual                            staining, small fragments of stool and/or opaque                            liquid, but mucosa seen well), Transverse  Colon = 3                            (entire mucosa seen well with no residual staining,                            small fragments of stool or opaque liquid) and Left                            Colon = 3 (entire mucosa seen well with no residual                            staining, small fragments of stool or opaque                            liquid). The total BBPS score equals 8. The quality                            of the bowel preparation was good. Scope In: 7:50:46 AM Scope Out: 8:04:23 AM Scope Withdrawal Time: 0 hours 8 minutes 55 seconds  Total Procedure Duration: 0 hours 13 minutes 37 seconds  Findings:      The perianal and digital rectal examinations were normal.      Non-bleeding internal hemorrhoids were found during endoscopy.      The exam was otherwise without abnormality. Impression:               - Non-bleeding internal hemorrhoids.                           - The examination was otherwise normal.                           - No specimens collected. Moderate Sedation:      Per Anesthesia Care Recommendation:           - Patient has a contact number available for                            emergencies. The signs and symptoms of potential                            delayed complications were discussed  with the                            patient. Return to normal activities tomorrow.                            Written discharge instructions were provided to the                            patient.                           - Resume previous diet.                           - Continue present medications.                           - Repeat colonoscopy in 10 years for screening                            purposes.                           - Return to GI clinic in 4 months. Procedure Code(s):        --- Professional ---                           [phone], Colonoscopy, flexible; diagnostic, including                            collection of specimen(s) by brushing or  washing,                            when performed (separate procedure) Diagnosis Code(s):        --- Professional ---                           K64.8, Other hemorrhoids                           K57.32, Diverticulitis of large intestine without                            perforation or abscess without bleeding CPT copyright 2019 American Medical Association. All rights reserved. The codes documented in this report are preliminary and upon coder review may  be revised to meet current compliance requirements. Elon Alas. Abbey Chatters, DO [place] Abbey Chatters, DO 02/04/2021 8:07:50 AM This report has been signed electronically. Number of Addenda: 0

## 2021-02-04 NOTE — Interval H&P Note (Signed)
History and Physical Interval Note:  02/04/2021 7:29 AM  Sheryl Lowe  has presented today for surgery, with the diagnosis of C DIFF, NAUSEA, VOMITING, DIVERTICULITIS, GERD.  The various methods of treatment have been discussed with the patient and family. After consideration of risks, benefits and other options for treatment, the patient has consented to  Procedure(s) with comments: COLONOSCOPY WITH PROPOFOL (N/A) - 7:30am ESOPHAGOGASTRODUODENOSCOPY (EGD) WITH PROPOFOL (N/A) as a surgical intervention.  The patient's history has been reviewed, patient examined, no change in status, stable for surgery.  I have reviewed the patient's chart and labs.  Questions were answered to the patient's satisfaction.     Lanelle Bal

## 2021-02-04 NOTE — Transfer of Care (Signed)
Immediate Anesthesia Transfer of Care Note  Patient: Sheryl Lowe  Procedure(s) Performed: COLONOSCOPY WITH PROPOFOL ESOPHAGOGASTRODUODENOSCOPY (EGD) WITH PROPOFOL BIOPSY  Patient Location: Endoscopy Unit  Anesthesia Type:General  Level of Consciousness: awake  Airway & Oxygen Therapy: Patient Spontanous Breathing  Post-op Assessment: Report given to RN and Post -op Vital signs reviewed and stable  Post vital signs: Reviewed and stable  Last Vitals:  Vitals Value Taken Time  BP    Temp    Pulse 84 02/04/21 0807  Resp    SpO2 98%     Last Pain:  Vitals:   02/04/21 0807  TempSrc: Oral  PainSc:       Patients Stated Pain Goal: 5 (02/04/21 4114)  Complications: No notable events documented.

## 2021-02-04 NOTE — Discharge Instructions (Addendum)
EGD Discharge instructions Please read the instructions outlined below and refer to this sheet in the next few weeks. These discharge instructions provide you with general information on caring for yourself after you leave the hospital. Your doctor may also give you specific instructions. While your treatment has been planned according to the most current medical practices available, unavoidable complications occasionally occur. If you have any problems or questions after discharge, please call your doctor. ACTIVITY You may resume your regular activity but move at a slower pace for the next 24 hours.  Take frequent rest periods for the next 24 hours.  Walking will help expel (get rid of) the air and reduce the bloated feeling in your abdomen.  No driving for 24 hours (because of the anesthesia (medicine) used during the test).  You may shower.  Do not sign any important legal documents or operate any machinery for 24 hours (because of the anesthesia used during the test).  NUTRITION Drink plenty of fluids.  You may resume your normal diet.  Begin with a light meal and progress to your normal diet.  Avoid alcoholic beverages for 24 hours or as instructed by your caregiver.  MEDICATIONS You may resume your normal medications unless your caregiver tells you otherwise.  WHAT YOU CAN EXPECT TODAY You may experience abdominal discomfort such as a feeling of fullness or "gas" pains.  FOLLOW-UP Your doctor will discuss the results of your test with you.  SEEK IMMEDIATE MEDICAL ATTENTION IF ANY OF THE FOLLOWING OCCUR: Excessive nausea (feeling sick to your stomach) and/or vomiting.  Severe abdominal pain and distention (swelling).  Trouble swallowing.  Temperature over 101 F (37.8 C).  Rectal bleeding or vomiting of blood.     Colonoscopy Discharge Instructions  Read the instructions outlined below and refer to this sheet in the next few weeks. These discharge instructions provide you with  general information on caring for yourself after you leave the hospital. Your doctor may also give you specific instructions. While your treatment has been planned according to the most current medical practices available, unavoidable complications occasionally occur.   ACTIVITY You may resume your regular activity, but move at a slower pace for the next 24 hours.  Take frequent rest periods for the next 24 hours.  Walking will help get rid of the air and reduce the bloated feeling in your belly (abdomen).  No driving for 24 hours (because of the medicine (anesthesia) used during the test).   Do not sign any important legal documents or operate any machinery for 24 hours (because of the anesthesia used during the test).  NUTRITION Drink plenty of fluids.  You may resume your normal diet as instructed by your doctor.  Begin with a light meal and progress to your normal diet. Heavy or fried foods are harder to digest and may make you feel sick to your stomach (nauseated).  Avoid alcoholic beverages for 24 hours or as instructed.  MEDICATIONS You may resume your normal medications unless your doctor tells you otherwise.  WHAT YOU CAN EXPECT TODAY Some feelings of bloating in the abdomen.  Passage of more gas than usual.  Spotting of blood in your stool or on the toilet paper.  IF YOU HAD POLYPS REMOVED DURING THE COLONOSCOPY: No aspirin products for 7 days or as instructed.  No alcohol for 7 days or as instructed.  Eat a soft diet for the next 24 hours.  FINDING OUT THE RESULTS OF YOUR TEST Not all test results are  available during your visit. If your test results are not back during the visit, make an appointment with your caregiver to find out the results. Do not assume everything is normal if you have not heard from your caregiver or the medical facility. It is important for you to follow up on all of your test results.  SEEK IMMEDIATE MEDICAL ATTENTION IF: You have more than a spotting of  blood in your stool.  Your belly is swollen (abdominal distention).  You are nauseated or vomiting.  You have a temperature over 101.  You have abdominal pain or discomfort that is severe or gets worse throughout the day.   Your EGD revealed mild amount inflammation in your stomach.  I took biopsies of this to rule out infection with a bacteria called H. pylori.  You also had findings suspicious for Barrett's esophagus. Await pathology results, my office will contact you.  I am going to increase your omeprazole to twice daily for the next 12 weeks at which point you can go down to once daily thereafter.  Your colonoscopy was relatively unremarkable.  I did not find any polyps or evidence of colon cancer.  I recommend repeating colonoscopy in 10 years for colon cancer screening purposes.  You do have diverticulosis and internal hemorrhoids. I would recommend increasing fiber in your diet or adding OTC Benefiber/Metamucil. Be sure to drink at least 4 to 6 glasses of water daily. Follow-up with GI in 4 months.    I hope you have a great rest of your week!  Hennie Duos. Marletta Lor, D.O. Gastroenterology and Hepatology [facility]

## 2021-02-04 NOTE — Anesthesia Postprocedure Evaluation (Signed)
Anesthesia Post Note  Patient: Desera Sugg  Procedure(s) Performed: COLONOSCOPY WITH PROPOFOL ESOPHAGOGASTRODUODENOSCOPY (EGD) WITH PROPOFOL BIOPSY  Patient location during evaluation: Phase II Anesthesia Type: General Level of consciousness: awake Pain management: pain level controlled Vital Signs Assessment: post-procedure vital signs reviewed and stable Respiratory status: spontaneous breathing and respiratory function stable Cardiovascular status: blood pressure returned to baseline and stable Postop Assessment: no headache and no apparent nausea or vomiting Anesthetic complications: no Comments: Late entry   No notable events documented.   Last Vitals:  Vitals:   02/04/21 0640 02/04/21 0807  BP: 129/80 (!) 94/56  Pulse:  84  Resp: 20 16  Temp: 36.4 C 36.7 C  SpO2: 98% 97%    Last Pain:  Vitals:   02/04/21 0807  TempSrc: Oral  PainSc: 0-No pain                 Windell Norfolk

## 2021-02-07 LAB — SURGICAL PATHOLOGY

## 2021-02-09 ENCOUNTER — Encounter (HOSPITAL_COMMUNITY): Payer: Self-pay | Admitting: Internal Medicine

## 2021-06-07 ENCOUNTER — Encounter: Payer: Self-pay | Admitting: Gastroenterology

## 2021-06-07 ENCOUNTER — Ambulatory Visit: Payer: BC Managed Care – PPO | Admitting: Gastroenterology

## 2021-06-07 VITALS — BP 142/80 | HR 76 | Temp 97.7°F | Ht 60.0 in | Wt 149.4 lb

## 2021-06-07 DIAGNOSIS — Z8619 Personal history of other infectious and parasitic diseases: Secondary | ICD-10-CM | POA: Diagnosis not present

## 2021-06-07 DIAGNOSIS — K219 Gastro-esophageal reflux disease without esophagitis: Secondary | ICD-10-CM | POA: Diagnosis not present

## 2021-06-07 NOTE — Progress Notes (Signed)
? ?GI Office Note   ? ?Referring Provider: Georgann Housekeeper, NP ?Primary Care Physician:  Georgann Housekeeper, NP ?Primary GI: Dr. Abbey Chatters ? ?Date:  06/07/2021  ?ID:  Sheryl Lowe, DOB 04-15-1960, MRN [id number] ? ? ?Chief Complaint  ? ?Chief Complaint  ?Patient presents with  ? Follow-up  ? ? ? ?History of Present Illness  ?Sheryl Lowe is a 61 y.o. female with a history of anxiety, HLD, HTN, migraines, and C. difficile infection presenting today for follow up post procedure. ? ?Colonoscopy 02/04/21 -nonbleeding internal hemorrhoids, exam otherwise normal.  Repeat in 10 years ? ?EGD 02/04/21 -esophageal mucosal changes suspicious for short segment Barrett's esophagus (intestinal metaplasia), gastritis (mild nonspecific reactive gastropathy), normal duodenum, small hiatal hernia. Repeat EGD in 3 years.  ? ?Last office visit 01/05/21 - She had recently been hospitalized in October 2022 for abdominal pain and diarrhea. She had CT imaging of diffuse colitis and sigmoid diverticulitis. During hospitalization she had recently finished vancomycin course but symptoms had returned. She was started on an extended vancomycin taper and was feeling better at this visit. Dr. Abbey Chatters discussed with her at last visit that she should get prophylactic vancomycin for up to 5 days after antibiotics if they were needed for future infections.  ? ?Today: ?Patient reports has been well overall.  She is taking omeprazole daily as directed after she took 3 months of twice daily.  She has noted some twinges of pain in her right upper quadrant and epigastrium only occasionally and feels like its been related to certain foods that she is eating. She reports that she tried to take [facility] for weight loss a couple months ago without extreme nausea with a little bit of vomiting.  Otherwise she has denied any frequent nausea, vomiting, early satiety, lack of appetite, dysphagia, abdominal pain, melena, hematochezia, diarrhea, or constipation.  ? ?She reports  she will be following up with her PCP regarding her slightly elevated blood pressure and to discuss further weight loss efforts. ? ? ?Past Medical History:  ?Diagnosis Date  ? Anxiety   ? Clostridium difficile infection   ? Hyperlipidemia   ? Hypertension   ? Kidney stones   ? Migraine   ? ? ?Past Surgical History:  ?Procedure Laterality Date  ? BIOPSY  02/04/2021  ? Procedure: BIOPSY;  Surgeon: Eloise Harman, DO;  [facility]: [facility];  Service: Endoscopy;;  ? CHOLECYSTECTOMY  2020  ? COLONOSCOPY WITH PROPOFOL N/A 02/04/2021  ? Procedure: COLONOSCOPY WITH PROPOFOL;  Surgeon: Eloise Harman, DO;  Location: [facility];  Service: Endoscopy;  Laterality: N/A;  7:30am  ? ESOPHAGOGASTRODUODENOSCOPY (EGD) WITH PROPOFOL N/A 02/04/2021  ? Procedure: ESOPHAGOGASTRODUODENOSCOPY (EGD) WITH PROPOFOL;  Surgeon: Eloise Harman, DO;  Location: [facility];  Service: Endoscopy;  Laterality: N/A;  ? ? ?Current Outpatient Medications  ?Medication Sig Dispense Refill  ? atorvastatin (LIPITOR) 10 MG tablet Take 1 tablet (10 mg total) by mouth daily. Need f/u visit for additional refills 30 tablet 0  ? omeprazole (PRILOSEC) 20 MG capsule Take 1 capsule (20 mg total) by mouth 2 (two) times daily before a meal. (Patient taking differently: Take 20 mg by mouth daily.) 60 capsule 5  ? valsartan-hydrochlorothiazide (DIOVAN-HCT) 80-12.5 MG tablet Take 1/2 tablet by mouth daily for blood pressure (Patient taking differently: Take 1 tablet by mouth daily.) 15 tablet 1  ? ?No current facility-administered medications for this visit.  ? ? ?Allergies as of 06/07/2021 - Review Complete 06/07/2021  ?Allergen  Reaction Noted  ? Amitriptyline  10/18/2015  ? Topamax [topiramate]  10/18/2015  ? ? ?Family History  ?Problem Relation Age of Onset  ? Stroke Mother   ? Heart disease Mother   ? Hypertension Father   ? Cancer Father   ? Colon cancer Neg Hx   ? Colon polyps Neg Hx   ? ? ?Social History  ? ?Socioeconomic History  ? Marital status:  Married  ?  Spouse name: Not on file  ? Number of children: Not on file  ? Years of education: Not on file  ? Highest education level: Not on file  ?Occupational History  ? Not on file  ?Tobacco Use  ? Smoking status: Never  ? Smokeless tobacco: Never  ?Vaping Use  ? Vaping Use: Never used  ?Substance and Sexual Activity  ? Alcohol use: Not Currently  ? Drug use: No  ? Sexual activity: Not on file  ?Other Topics Concern  ? Not on file  ?Social History Narrative  ? Not on file  ? ?Social Determinants of Health  ? ?Financial Resource Strain: Not on file  ?Food Insecurity: Not on file  ?Transportation Needs: Not on file  ?Physical Activity: Not on file  ?Stress: Not on file  ?Social Connections: Not on file  ? ? ? ?Review of Systems  ? ?Gen: Denies fever, chills, anorexia. Denies fatigue, weakness, weight loss.  ?CV: Denies chest pain, palpitations, syncope, peripheral edema, and claudication. ?Resp: Denies dyspnea at rest, cough, wheezing, coughing up blood, and pleurisy. ?GI: Denies vomiting blood, jaundice, and fecal incontinence.   Denies dysphagia or odynophagia. ?Derm: Denies rash, itching, dry skin ?Psych: Denies depression, anxiety, memory loss, confusion. No homicidal or suicidal ideation.  ?Heme: Denies bruising, bleeding, and enlarged lymph nodes. ? ? ?Physical Exam  ? ?BP (!) 142/80   Pulse 76   Temp 97.7 ?F (36.5 ?C) (Temporal)   Ht 5' (1.524 m)   Wt 149 lb 6.4 oz (67.8 kg)   BMI 29.18 kg/m?  ? ?General:   Alert and oriented. No distress noted. Pleasant and cooperative.  ?Head:  Normocephalic and atraumatic. ?Eyes:  Conjuctiva clear without scleral icterus. ?Lungs:  Clear to auscultation bilaterally. No wheezes, rales, or rhonchi. No distress.  ?Heart:  S1, S2 present without murmurs appreciated.  ?Abdomen:  +BS, soft, non-tender and non-distended. No rebound or guarding. No HSM or masses noted. ?Rectal: deferred ?Msk:  Symmetrical without gross deformities. Normal posture. ?Extremities:  Without  edema. ?Neurologic:  Alert and  oriented x4 ?Psych:  Alert and cooperative. Normal mood and affect. ? ? ?Assessment  ?Sheryl Lowe is a 61 y.o. female with a history of anxiety, HLD, HTN, migraines, and C. difficile infection presenting today doing well post procedure. ? ?GERD: Most recent EGD 02/04/21 with small hiatal hernia, gastritis, and short segment Barrett's esophagus.  She was given omeprazole 20 mg twice daily to take for 12 weeks.  She has subsequently reduced to once a day as directed.  She is doing well overall with very rare breakthrough symptoms.  She continues to follow with her PCP regarding weight loss efforts.  We will continue omeprazole 20 mg once a day. ? ?History of C. Difficile colitis: She finished her vancomycin taper shortly after her last office visit.  Most recent colonoscopy 02/04/21 with no evidence of colitis, only internal hemorrhoids.  She has not had a recent infection requiring antibiotics.  She states that she is aware to contact the office for vancomycin prophylaxis if she  were to require antibiotics in the future.  Doing well overall without diarrhea or abdominal pain. ? ? ?PLAN  ? ? ?Omeprazole 20 mg once daily. ?Continue weight loss efforts and avoid food triggers. ?Reinforced need for prophylactic vancomycin if requires antibiotics for any reason in the  future. ?Follow-up with PCP regarding slightly elevated blood pressure. ?Follow up OV in 6 months.  ? ? ? ?Venetia Night, MSN, FNP-BC, AGACNP-BC ?[facility] ? ?

## 2021-06-07 NOTE — Patient Instructions (Signed)
It was a pleasure to meet you today. ? ?He needs to take your omeprazole 1 tablet daily 30 minutes before breakfast.  Glad to hear you are doing well. ? ?We will see you back in about 6 months or sooner if needed.  Feel free to reach out to [place] in the future if you have been prescribed antibiotics any the vancomycin. ? ?It was a pleasure to see you today. I want to create trusting relationships with patients. If you receive a survey regarding your visit,  I greatly appreciate you taking time to fill this out on paper or through your MyChart. I value your feedback. ? ?Venetia Night, MSN, FNP-BC, AGACNP-BC ?[facility] ? ? ?

## 2021-08-24 ENCOUNTER — Telehealth: Payer: Self-pay

## 2021-08-24 NOTE — Telephone Encounter (Signed)
Pt LMOVM for a call back. Seen Sheryl Lowe last

## 2021-08-24 NOTE — Telephone Encounter (Signed)
LMOM for pt to call office back 

## 2021-08-24 NOTE — Telephone Encounter (Signed)
error 

## 2021-08-30 NOTE — Telephone Encounter (Signed)
Tried several times to reach pt. Left voicemail again to call office.

## 2021-09-13 ENCOUNTER — Ambulatory Visit (INDEPENDENT_AMBULATORY_CARE_PROVIDER_SITE_OTHER): Payer: BC Managed Care – PPO | Admitting: Gastroenterology

## 2021-09-13 ENCOUNTER — Encounter: Payer: Self-pay | Admitting: Gastroenterology

## 2021-09-13 ENCOUNTER — Encounter: Payer: Self-pay | Admitting: *Deleted

## 2021-09-13 DIAGNOSIS — R1011 Right upper quadrant pain: Secondary | ICD-10-CM | POA: Insufficient documentation

## 2021-09-13 NOTE — Patient Instructions (Signed)
Please have blood work done today.  I have ordered an ultrasound to be done as soon as possible.  Further recommendations to follow!  I enjoyed seeing you again today! As you know, I value our relationship and want to provide genuine, compassionate, and quality care. I welcome your feedback. If you receive a survey regarding your visit,  I greatly appreciate you taking time to fill this out. See you next time!  Gelene Mink, PhD, ANP-BC [facility]

## 2021-09-13 NOTE — Progress Notes (Signed)
Gastroenterology Office Note     Primary Care Physician:  Pollyann Savoy, NP  Primary Gastroenterologist: Dr. Marletta Lor   Chief Complaint   Chief Complaint  Patient presents with   Abdominal Pain    Right upper abdominal pain. Coughing more than usual. Swallowing problems      History of Present Illness   Sheryl Lowe is a 61 y.o. female presenting today in follow-up with a history of refractory Cdiff in Sept/Oct 2022 s/p vanc taper, Barrett's noting on EGD in 2022 (surveillance in 2025), returning in follow-up today with RUQ pain.   Sometimes RUQ Postprandially. Occasional nausea after eating. No vomiting. Present for about 2-3 months. Squeezing or jabbing in RUQ worsening over past few weeks. Thought she had a UTI because urine dark. Klebseilla pneumonia in urine culture. Urine comes and goes dark. History of chronic itching entire life. No jaundice. Cholecystectomy in 2020.   Omeprazole once daily.   Past Medical History:  Diagnosis Date   Anxiety    Clostridium difficile infection    Hyperlipidemia    Hypertension    Kidney stones    Migraine     Past Surgical History:  Procedure Laterality Date   BIOPSY  02/04/2021   Procedure: BIOPSY;  Surgeon: Lanelle Bal, DO;  Location: [facility];  Service: Endoscopy;;   CHOLECYSTECTOMY  2020   COLONOSCOPY WITH PROPOFOL N/A 02/04/2021   nonbleeding internal hemorrhoids, exam otherwise normal.  Repeat in 10 years   ESOPHAGOGASTRODUODENOSCOPY (EGD) WITH PROPOFOL N/A 02/04/2021   esophageal mucosal changes suspicious for short segment Barrett's esophagus (intestinal metaplasia), gastritis (mild nonspecific reactive gastropathy), normal duodenum, small hiatal hernia. Repeat EGD in 3 years.    Current Outpatient Medications  Medication Sig Dispense Refill   atorvastatin (LIPITOR) 10 MG tablet Take 1 tablet (10 mg total) by mouth daily. Need f/u visit for additional refills 30 tablet 0   omeprazole (PRILOSEC) 20  MG capsule Take 1 capsule (20 mg total) by mouth 2 (two) times daily before a meal. (Patient taking differently: Take 20 mg by mouth daily.) 60 capsule 5   valsartan-hydrochlorothiazide (DIOVAN-HCT) 80-12.5 MG tablet Take 1/2 tablet by mouth daily for blood pressure (Patient taking differently: Take 1 tablet by mouth daily.) 15 tablet 1   No current facility-administered medications for this visit.    Allergies as of 09/13/2021 - Review Complete 09/13/2021  Allergen Reaction Noted   Amitriptyline  10/18/2015   Topamax [topiramate]  10/18/2015    Family History  Problem Relation Age of Onset   Stroke Mother    Heart disease Mother    Hypertension Father    Cancer Father    Colon cancer Neg Hx    Colon polyps Neg Hx     Social History   Socioeconomic History   Marital status: Married    Spouse name: Not on file   Number of children: Not on file   Years of education: Not on file   Highest education level: Not on file  Occupational History   Not on file  Tobacco Use   Smoking status: Never   Smokeless tobacco: Never  Vaping Use   Vaping Use: Never used  Substance and Sexual Activity   Alcohol use: Not Currently   Drug use: No   Sexual activity: Not on file  Other Topics Concern   Not on file  Social History Narrative   Not on file   Social Determinants of Health   Financial Resource  Strain: Not on file  Food Insecurity: Not on file  Transportation Needs: Not on file  Physical Activity: Not on file  Stress: Not on file  Social Connections: Not on file  Intimate Partner Violence: Not on file     Review of Systems   Gen: Denies any fever, chills, fatigue, weight loss, lack of appetite.  CV: Denies chest pain, heart palpitations, peripheral edema, syncope.  Resp: Denies shortness of breath at rest or with exertion. Denies wheezing or cough.  GI: see HPI GU : Denies urinary burning, urinary frequency, urinary hesitancy MS: Denies joint pain, muscle weakness,  cramps, or limitation of movement.  Derm: Denies rash, itching, dry skin Psych: Denies depression, anxiety, memory loss, and confusion Heme: Denies bruising, bleeding, and enlarged lymph nodes.   Physical Exam   BP 136/78   Pulse 85   Temp 98.3 F (36.8 C) (Temporal)   Ht 5' (1.524 m)   Wt 150 lb (68 kg)   BMI 29.29 kg/m  General:   Alert and oriented. Pleasant and cooperative. Well-nourished and well-developed.  Head:  Normocephalic and atraumatic. Eyes:  Without icterus Abdomen:  +BS, soft, mild TTP epigastric and non-distended. No HSM noted. No guarding or rebound. No masses appreciated.  Rectal:  Deferred  Msk:  Symmetrical without gross deformities. Normal posture. Extremities:  Without edema. Neurologic:  Alert and  oriented x4;  grossly normal neurologically. Skin:  Intact without significant lesions or rashes. Psych:  Alert and cooperative. Normal mood and affect.   Assessment   Sheryl Lowe is a 61 y.o. female presenting today in follow-up with a history of  refractory Cdiff in Sept/Oct 2022 s/p vanc taper, Barrett's noting on EGD in 2022 (surveillance in 2025), returning in follow-up today with RUQ pain.   Cdiff: resolved. Doing well from this standpoint.  GERD/Barrett's: doing well on PPI daily  RUQ pain: unclear etiology. Gallbladder absent. Occasionally exacerbated postprandially and associated nausea. Will check labs and RUQ [place].     PLAN    RUQ [place], CBC, HFP Continue PPI daily EGD in 2025 Further recommendations to follow    Gelene Mink, PhD, ANP-BC [facility]

## 2021-09-14 ENCOUNTER — Ambulatory Visit (HOSPITAL_COMMUNITY)
Admission: RE | Admit: 2021-09-14 | Discharge: 2021-09-14 | Disposition: A | Discharge status: Home / Self Care | Payer: BC Managed Care – PPO | Source: Ambulatory Visit | Attending: Gastroenterology | Admitting: Gastroenterology

## 2021-09-14 DIAGNOSIS — R1011 Right upper quadrant pain: Secondary | ICD-10-CM | POA: Insufficient documentation

## 2021-09-14 LAB — CBC WITH DIFFERENTIAL/PLATELET
Absolute Monocytes: 456 cells/uL (ref 200–950)
Basophils Absolute: 56 cells/uL (ref 0–200)
Basophils Relative: 0.7 %
Eosinophils Absolute: 112 cells/uL (ref 15–500)
Eosinophils Relative: 1.4 %
HCT: 41.2 % (ref 35.0–45.0)
Hemoglobin: 13.8 g/dL (ref 11.7–15.5)
Lymphs Abs: 2144 cells/uL (ref 850–3900)
MCH: 29.2 pg (ref 27.0–33.0)
MCHC: 33.5 g/dL (ref 32.0–36.0)
MCV: 87.1 fL (ref 80.0–100.0)
MPV: 10.7 fL (ref 7.5–12.5)
Monocytes Relative: 5.7 %
Neutro Abs: 5232 cells/uL (ref 1500–7800)
Neutrophils Relative %: 65.4 %
Platelets: 264 10*3/uL (ref 140–400)
RBC: 4.73 10*6/uL (ref 3.80–5.10)
RDW: 13.4 % (ref 11.0–15.0)
Total Lymphocyte: 26.8 %
WBC: 8 10*3/uL (ref 3.8–10.8)

## 2021-09-14 LAB — HEPATIC FUNCTION PANEL
AG Ratio: 1.4 (calc) (ref 1.0–2.5)
ALT: 15 U/L (ref 6–29)
AST: 16 U/L (ref 10–35)
Albumin: 4.4 g/dL (ref 3.6–5.1)
Alkaline phosphatase (APISO): 118 U/L (ref 37–153)
Bilirubin, Direct: 0.1 mg/dL (ref 0.0–0.2)
Globulin: 3.2 g/dL (calc) (ref 1.9–3.7)
Indirect Bilirubin: 0.3 mg/dL (calc) (ref 0.2–1.2)
Total Bilirubin: 0.4 mg/dL (ref 0.2–1.2)
Total Protein: 7.6 g/dL (ref 6.1–8.1)

## 2021-10-15 IMAGING — CT CT RENAL STONE PROTOCOL
2 of 4 series · 17 of 46 positions shown, 19 images · non-contrast
Comparison: CT abdomen pelvis report dated 10/11/2018. The images
are not available for direct comparison.

CLINICAL DATA: Left lung pain.  Concern for kidney stone.

EXAM:
CT ABDOMEN AND PELVIS WITHOUT CONTRAST
TECHNIQUE: Multidetector CT imaging of the abdomen and pelvis was performed
following the standard protocol without IV contrast.

[Series 2: axial st · axial · 0.76mm/px · z∈[+1064,+1420]mm · 14 of 83 slices shown, 16 images]
[im 6/83  soft-tissue]
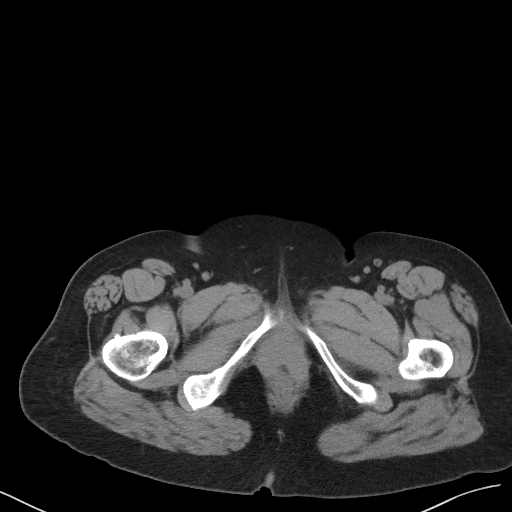
[im 6/83  bone]
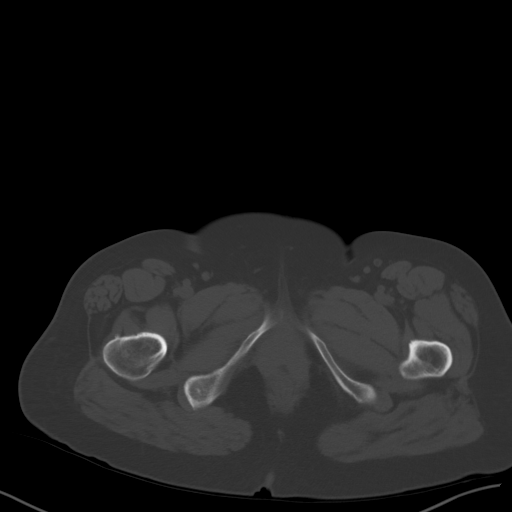
[im 11/83  soft-tissue]
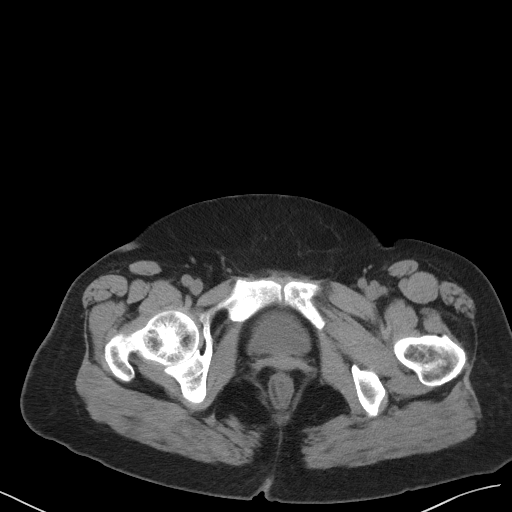
[im 16/83  soft-tissue]
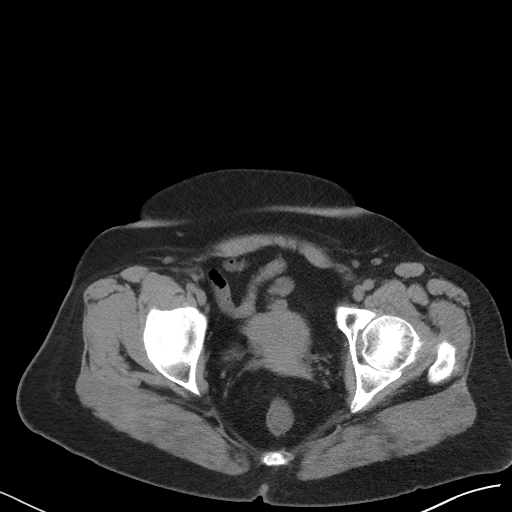
[im 21/83  soft-tissue]
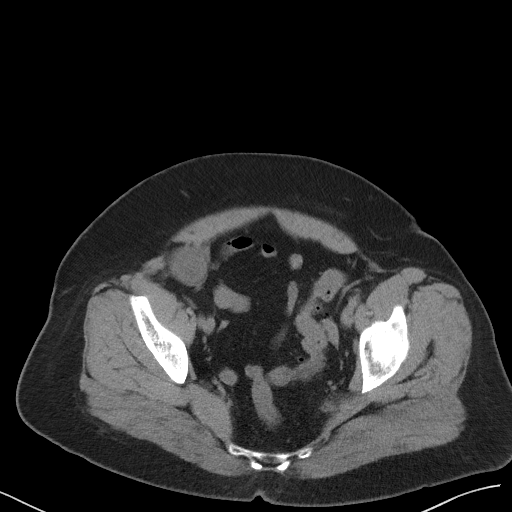
[im 26/83  soft-tissue]
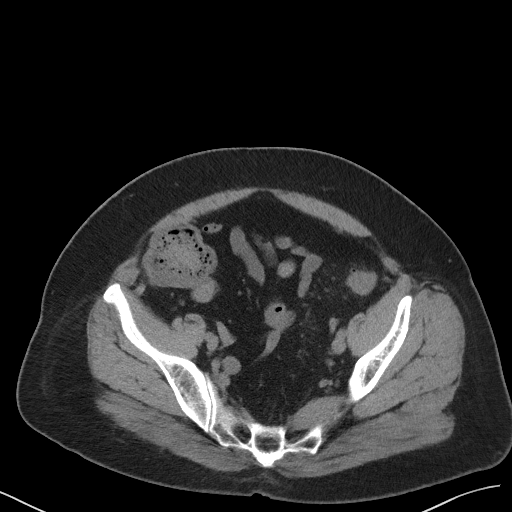
[im 31/83  soft-tissue]
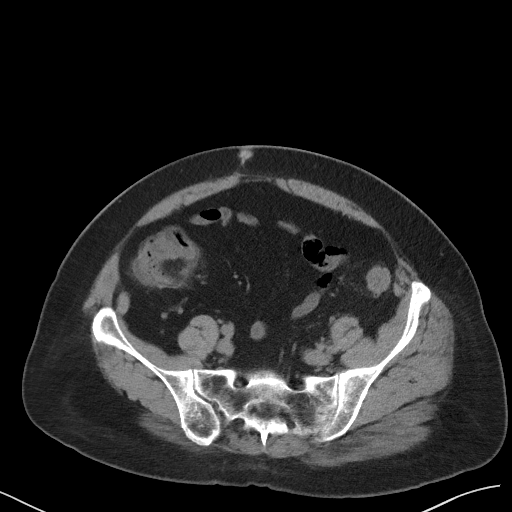
[im 36/83  soft-tissue]
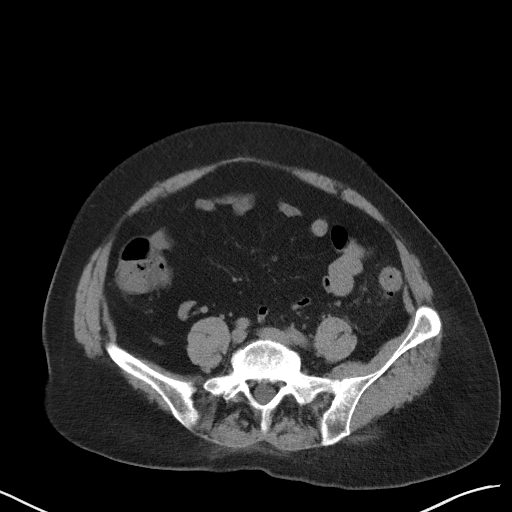
[im 47/83  soft-tissue]
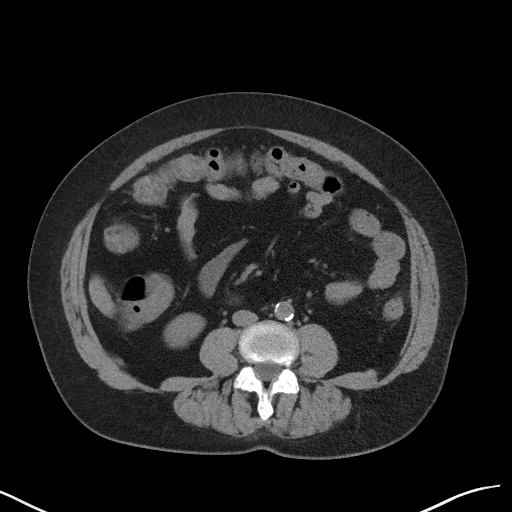
[im 52/83  soft-tissue]
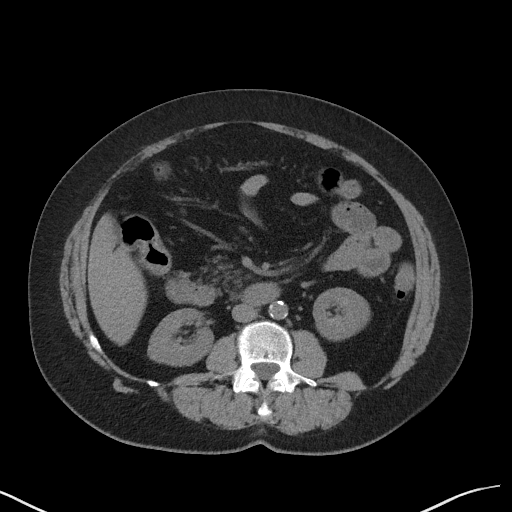
[im 52/83  bone]
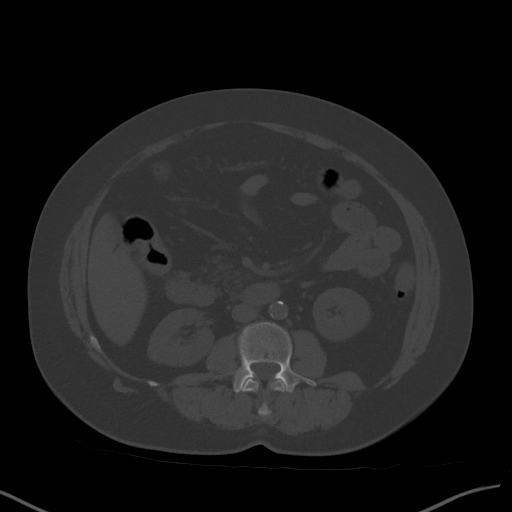
[im 57/83  soft-tissue]
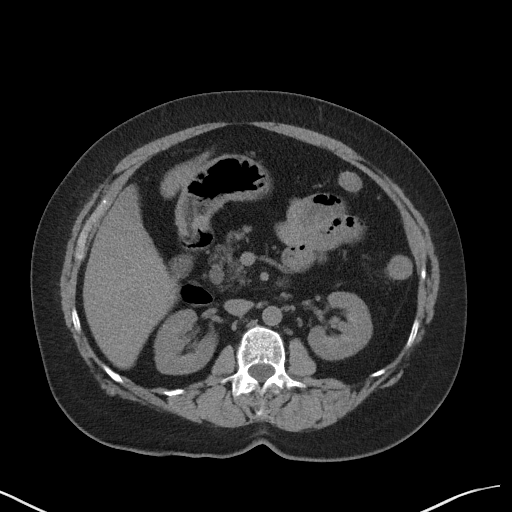
[im 62/83  soft-tissue]
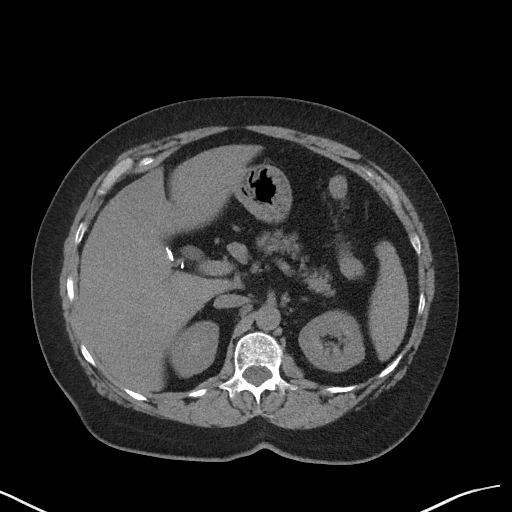
[im 67/83  soft-tissue]
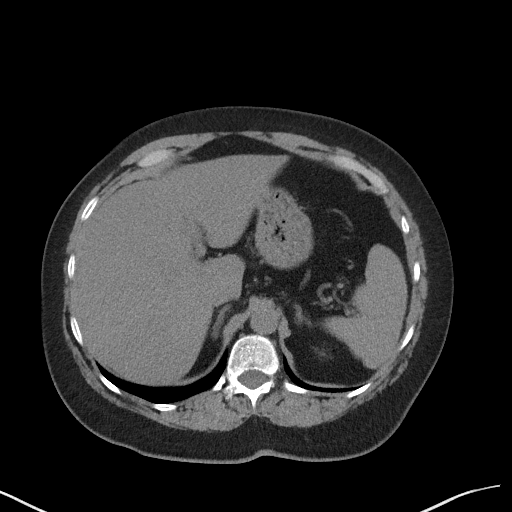
[im 72/83  soft-tissue]
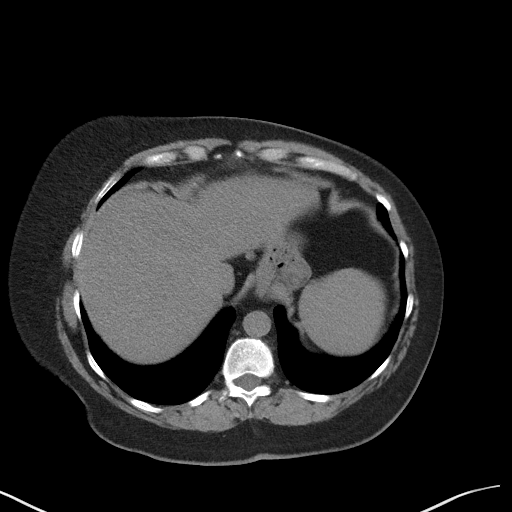
[im 77/83  soft-tissue]
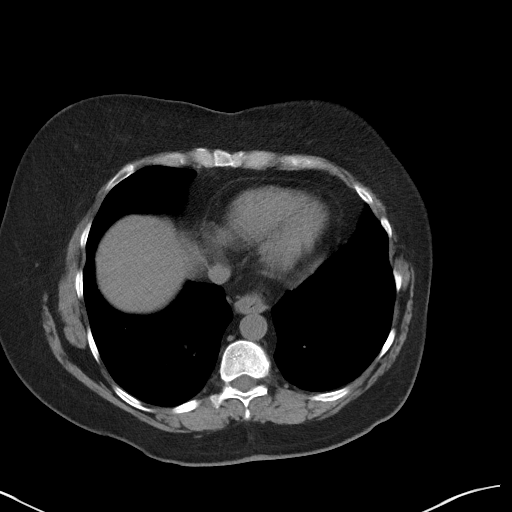

[Series 5: coronal st · coronal · 0.79mm/px · 3 of 97 slices shown]
[im 33/97  soft-tissue]
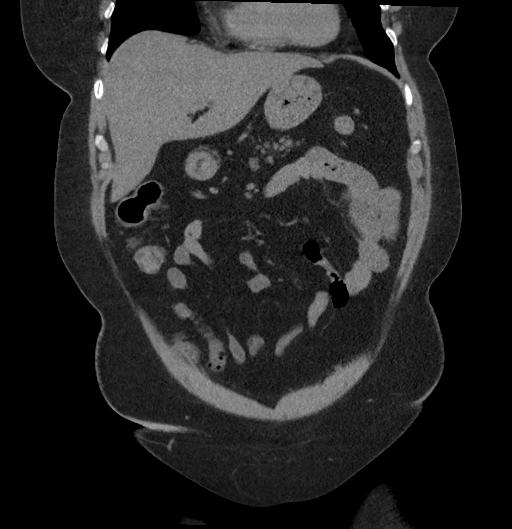
[im 43/97  soft-tissue]
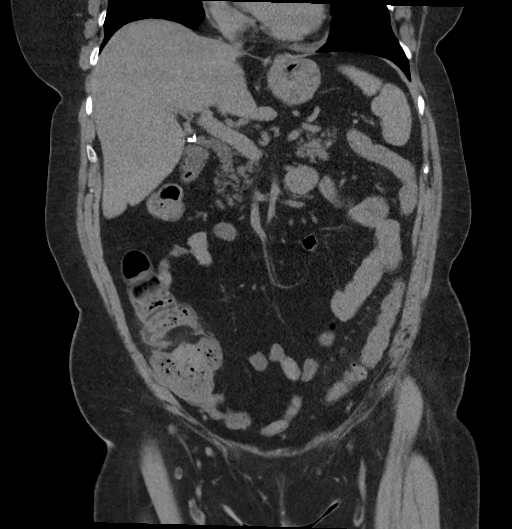
[im 54/97  soft-tissue]
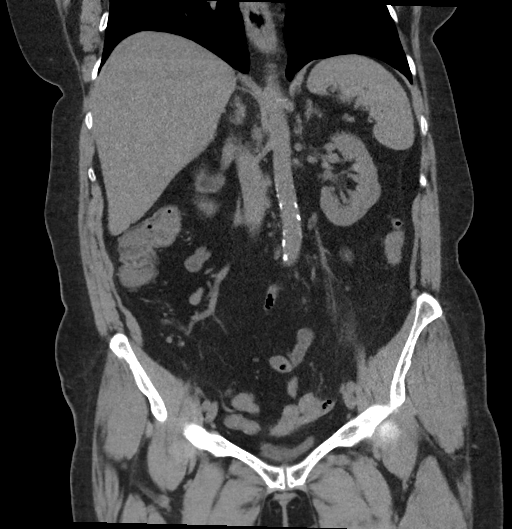

[17 of 46 positions shown; findings below may reference images not displayed]

FINDINGS: Evaluation of this exam is limited in the absence of intravenous
contrast.

Lower chest: The visualized lung bases are clear.

No intra-abdominal free air or free fluid.

Hepatobiliary: Fatty liver. No intrahepatic biliary ductal
dilatation. Cholecystectomy. No retained calcified stone noted in
the central CBD.

Pancreas: Unremarkable. No pancreatic ductal dilatation or
surrounding inflammatory changes.

Spleen: Normal in size without focal abnormality.

Adrenals/Urinary Tract: The adrenal glands are unremarkable. The
kidneys, visualized ureters, and urinary bladder appear unremarkable

Stomach/Bowel: There is sigmoid diverticulosis. Mild perisigmoid
haziness, likely chronic. There is mild thickened appearance of the
distal colon likely related to underdistention. Mild colitis is less
likely. Clinical correlation is recommended. There is no bowel
obstruction. The appendix is normal.

Vascular/Lymphatic: Mild aortoiliac atherosclerotic disease. The IVC
is unremarkable. The pain venous gas. There is no adenopathy.

Reproductive: The uterus is anteverted and grossly unremarkable. No
adnexal masses.

Other: None

Musculoskeletal: No acute or significant osseous findings.
IMPRESSION: 1. No acute intra-abdominal or pelvic pathology. No hydronephrosis
or nephrolithiasis.
2. Sigmoid diverticulosis. Underdistention versus less likely mild
colitis of the distal colon. Clinical correlation is recommended. No
bowel obstruction. Normal appendix.
3. Fatty liver.
4. Aortic Atherosclerosis ([id number]).

## 2021-11-23 ENCOUNTER — Encounter: Payer: Self-pay | Admitting: *Deleted

## 2021-11-27 IMAGING — CT CT ABD-PELV W/ CM
2 of 5 series · 16 of 46 positions shown, 18 images · IV contrast (Omnipaque or Isovue)
Comparison: 11/23/2020

CLINICAL DATA: Diverticulitis suspected; nausea and diarrhea, lower
abdominal pain

EXAM:
CT ABDOMEN AND PELVIS WITH CONTRAST
TECHNIQUE: Multidetector CT imaging of the abdomen and pelvis was performed
using the standard protocol following bolus administration of
intravenous contrast.
CONTRAST:  100mL OMNIPAQUE IOHEXOL 300 MG/ML  SOLN

[Series 2: axial st · axial · 0.68mm/px · z∈[+1041,+1451]mm · 13 of 94 slices shown, 15 images]
[im 6/94  soft-tissue]
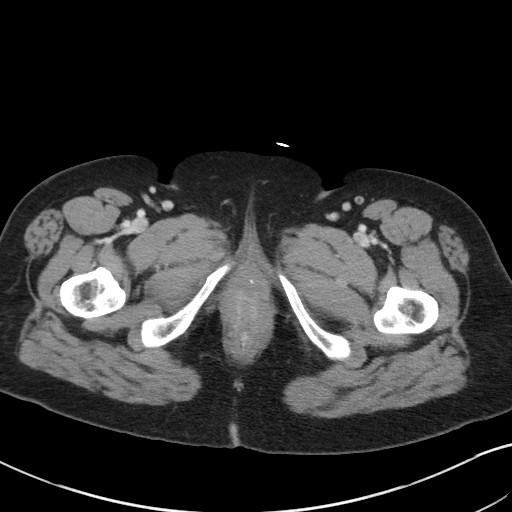
[im 6/94  bone]
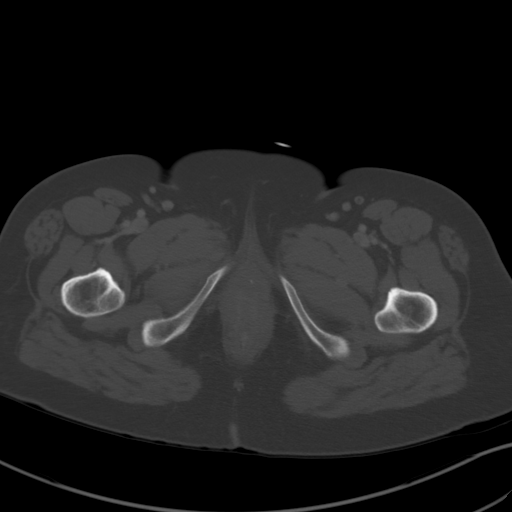
[im 12/94  soft-tissue]
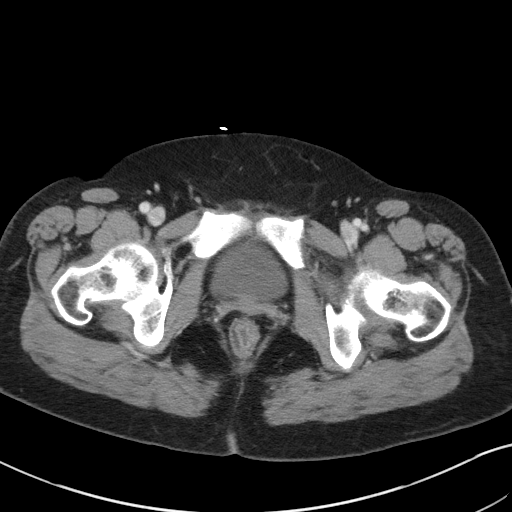
[im 18/94  soft-tissue]
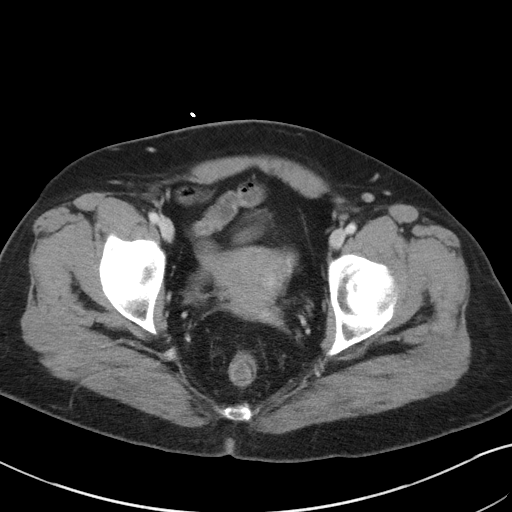
[im 30/94  soft-tissue]
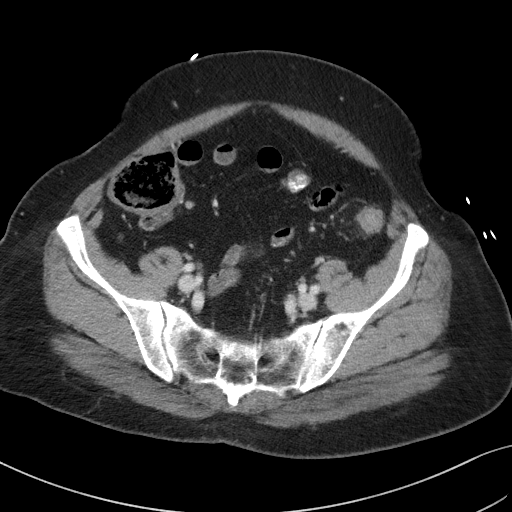
[im 35/94  soft-tissue]
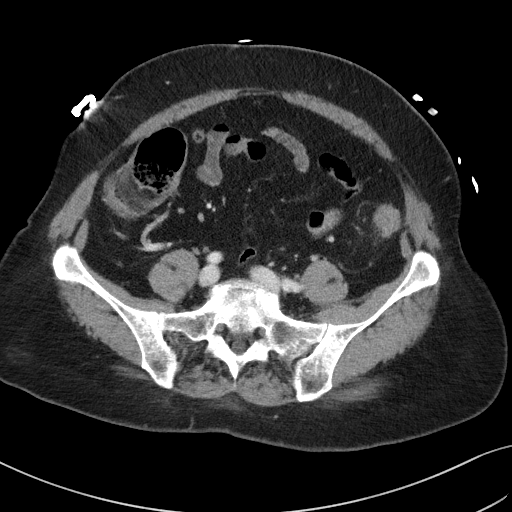
[im 41/94  soft-tissue]
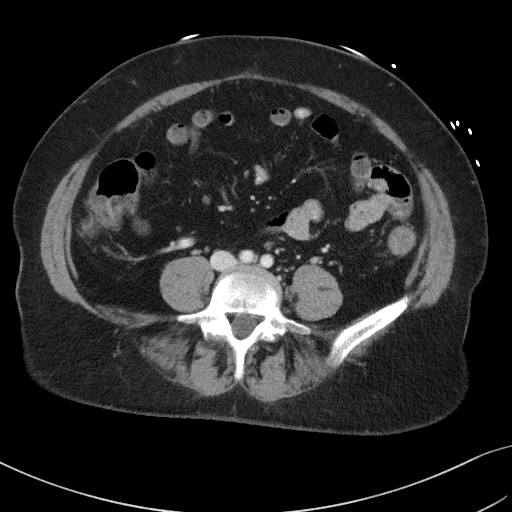
[im 47/94  soft-tissue]
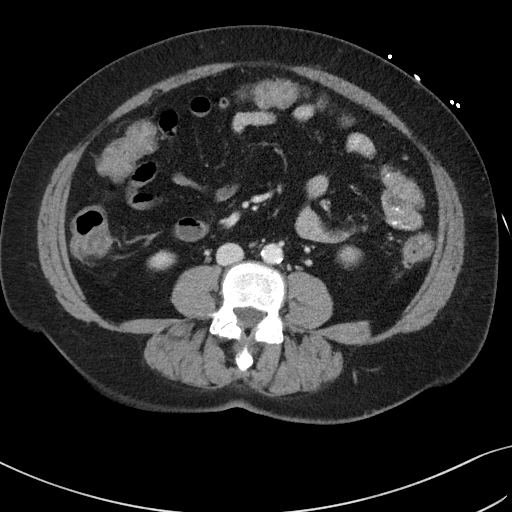
[im 53/94  soft-tissue]
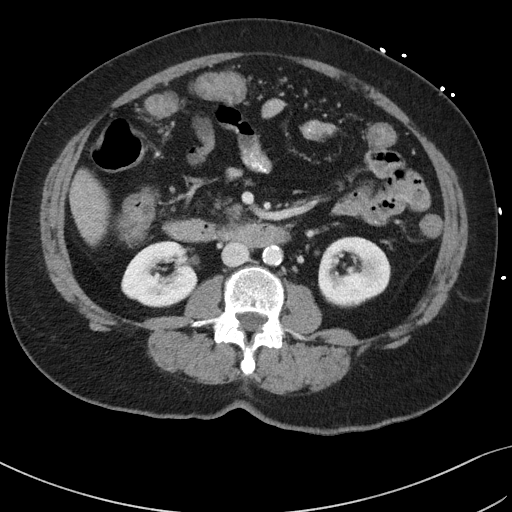
[im 59/94  soft-tissue]
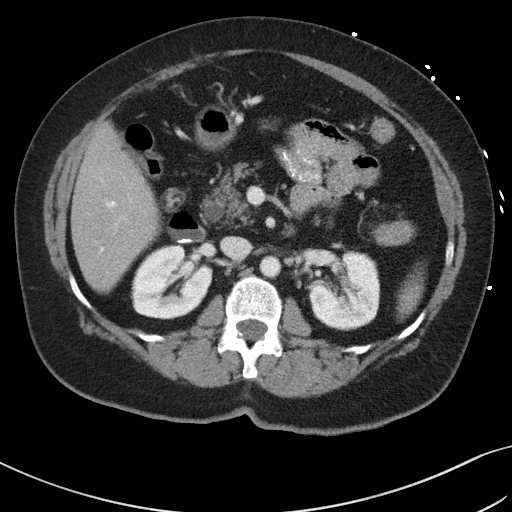
[im 59/94  bone]
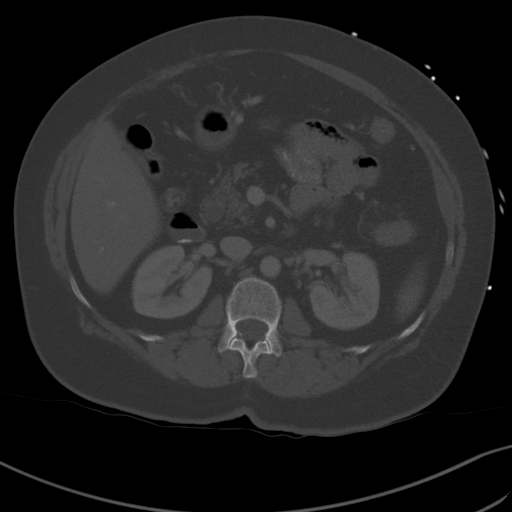
[im 64/94  soft-tissue]
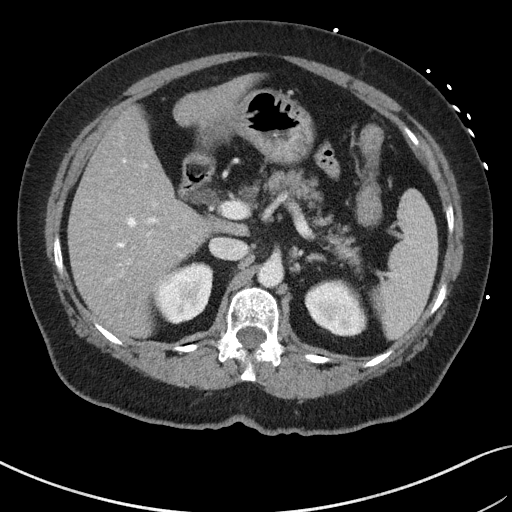
[im 76/94  soft-tissue]
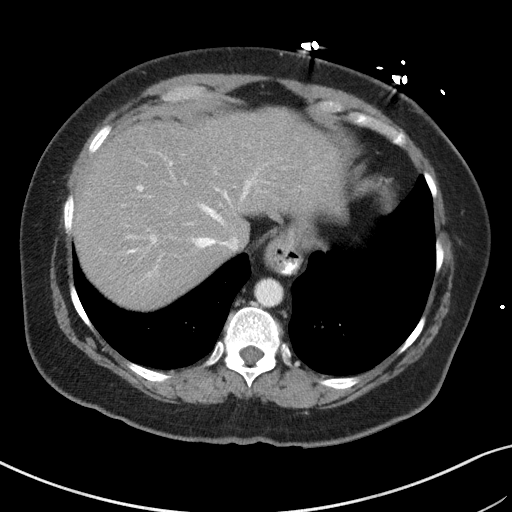
[im 82/94  soft-tissue]
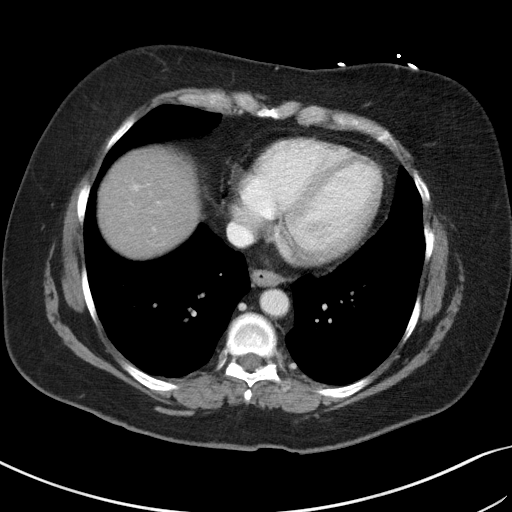
[im 88/94  soft-tissue]
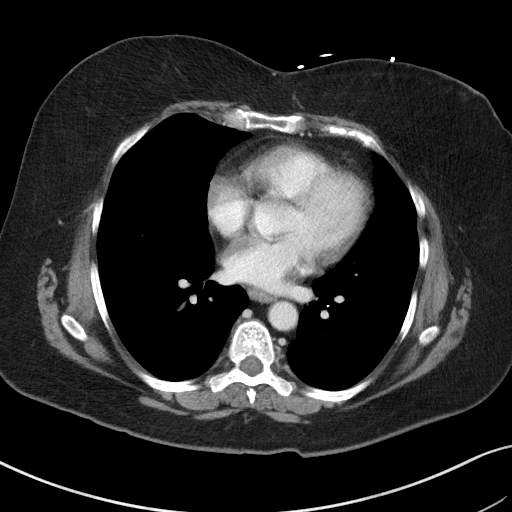

[Series 6: coronal st · coronal · 0.72mm/px · 3 of 115 slices shown]
[im 39/115  soft-tissue]
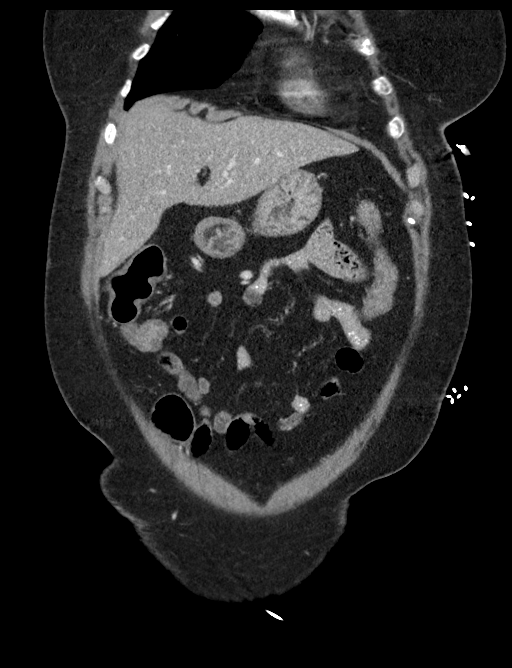
[im 51/115  soft-tissue]
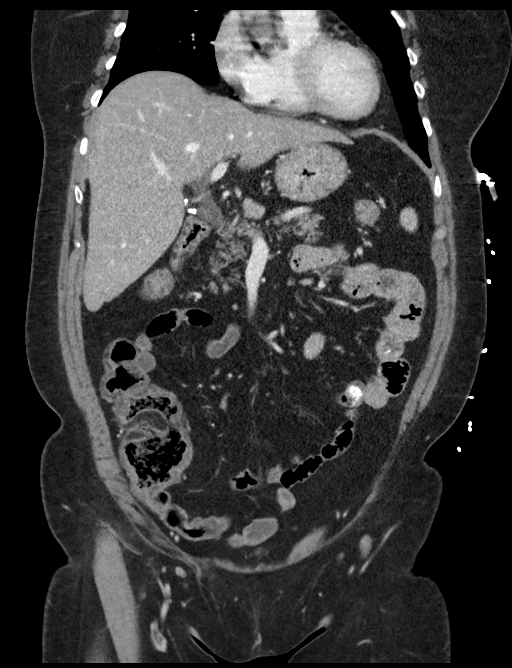
[im 64/115  soft-tissue]
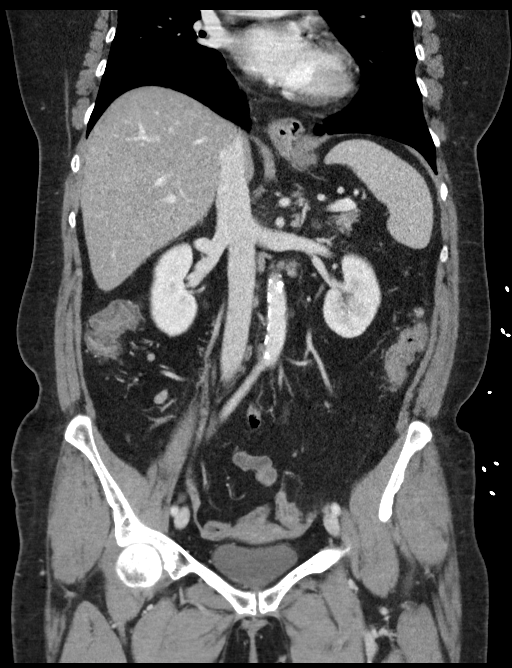

[16 of 46 positions shown; findings below may reference images not displayed]

FINDINGS: Lower chest: No acute abnormality.

Hepatobiliary: No focal liver abnormality is seen. Status post
cholecystectomy. No unexpected biliary dilatation.

Pancreas: Unremarkable.

Spleen: Unremarkable.

Adrenals/Urinary Tract: Adrenals, kidneys, and bladder are
unremarkable.

Stomach/Bowel: Small hiatal hernia. Stomach is otherwise
unremarkable. Bowel is normal in caliber and decompressed.
Descending colon and sigmoid diverticulosis. Possible minor
infiltration adjacent to the proximal sigmoid colon. Wall thickening
is noted along much of the colon, greater on the left.

Vascular/Lymphatic: Aortic atherosclerosis.  No enlarged lymph

Reproductive: Uterus and bilateral adnexa are unremarkable.

Other: No free fluid.  Abdominal wall is unremarkable.

Musculoskeletal: Lower lumbar degenerative changes. No acute osseous
abnormality
IMPRESSION: Colonic wall thickening, some which may be related to
underdistention. However, infectious/inflammatory colitis is
possible. There is also distal colonic diverticulosis with possible
minimal infiltration of fat adjacent to the proximal sigmoid raising
the possibility diverticulitis.

## 2022-03-16 ENCOUNTER — Encounter (HOSPITAL_COMMUNITY)
Admission: EM | Disposition: A | Discharge status: Home / Self Care | Payer: Self-pay | Source: Home / Self Care | Attending: Internal Medicine

## 2022-03-16 ENCOUNTER — Emergency Department (HOSPITAL_COMMUNITY): Payer: BC Managed Care – PPO

## 2022-03-16 ENCOUNTER — Encounter (HOSPITAL_COMMUNITY): Payer: Self-pay | Admitting: *Deleted

## 2022-03-16 ENCOUNTER — Other Ambulatory Visit: Payer: Self-pay

## 2022-03-16 ENCOUNTER — Inpatient Hospital Stay (HOSPITAL_COMMUNITY)
Admission: EM | Admit: 2022-03-16 | Discharge: 2022-03-20 | DRG: 854 | Disposition: A | Discharge status: Home / Self Care | Payer: BC Managed Care – PPO | Attending: Internal Medicine | Admitting: Internal Medicine

## 2022-03-16 DIAGNOSIS — R7303 Prediabetes: Secondary | ICD-10-CM | POA: Diagnosis not present

## 2022-03-16 DIAGNOSIS — N136 Pyonephrosis: Secondary | ICD-10-CM | POA: Diagnosis present

## 2022-03-16 DIAGNOSIS — Z1152 Encounter for screening for COVID-19: Secondary | ICD-10-CM | POA: Diagnosis not present

## 2022-03-16 DIAGNOSIS — N179 Acute kidney failure, unspecified: Secondary | ICD-10-CM | POA: Diagnosis present

## 2022-03-16 DIAGNOSIS — E782 Mixed hyperlipidemia: Secondary | ICD-10-CM | POA: Diagnosis present

## 2022-03-16 DIAGNOSIS — Z6829 Body mass index (BMI) 29.0-29.9, adult: Secondary | ICD-10-CM | POA: Diagnosis not present

## 2022-03-16 DIAGNOSIS — N200 Calculus of kidney: Secondary | ICD-10-CM

## 2022-03-16 DIAGNOSIS — E441 Mild protein-calorie malnutrition: Secondary | ICD-10-CM | POA: Diagnosis present

## 2022-03-16 DIAGNOSIS — E46 Unspecified protein-calorie malnutrition: Secondary | ICD-10-CM

## 2022-03-16 DIAGNOSIS — A0471 Enterocolitis due to Clostridium difficile, recurrent: Secondary | ICD-10-CM | POA: Diagnosis present

## 2022-03-16 DIAGNOSIS — A419 Sepsis, unspecified organism: Secondary | ICD-10-CM | POA: Diagnosis not present

## 2022-03-16 DIAGNOSIS — N10 Acute tubulo-interstitial nephritis: Secondary | ICD-10-CM | POA: Diagnosis present

## 2022-03-16 DIAGNOSIS — E119 Type 2 diabetes mellitus without complications: Secondary | ICD-10-CM | POA: Diagnosis present

## 2022-03-16 DIAGNOSIS — Z823 Family history of stroke: Secondary | ICD-10-CM | POA: Diagnosis not present

## 2022-03-16 DIAGNOSIS — Z8249 Family history of ischemic heart disease and other diseases of the circulatory system: Secondary | ICD-10-CM | POA: Diagnosis not present

## 2022-03-16 DIAGNOSIS — E876 Hypokalemia: Secondary | ICD-10-CM | POA: Diagnosis present

## 2022-03-16 DIAGNOSIS — N390 Urinary tract infection, site not specified: Secondary | ICD-10-CM

## 2022-03-16 DIAGNOSIS — E8809 Other disorders of plasma-protein metabolism, not elsewhere classified: Secondary | ICD-10-CM | POA: Diagnosis present

## 2022-03-16 DIAGNOSIS — Z888 Allergy status to other drugs, medicaments and biological substances status: Secondary | ICD-10-CM

## 2022-03-16 DIAGNOSIS — A4159 Other Gram-negative sepsis: Secondary | ICD-10-CM | POA: Diagnosis not present

## 2022-03-16 DIAGNOSIS — I10 Essential (primary) hypertension: Secondary | ICD-10-CM | POA: Diagnosis present

## 2022-03-16 DIAGNOSIS — K219 Gastro-esophageal reflux disease without esophagitis: Secondary | ICD-10-CM | POA: Diagnosis present

## 2022-03-16 DIAGNOSIS — A0472 Enterocolitis due to Clostridium difficile, not specified as recurrent: Secondary | ICD-10-CM | POA: Diagnosis not present

## 2022-03-16 HISTORY — PX: CYSTOSCOPY WITH RETROGRADE PYELOGRAM, URETEROSCOPY AND STENT PLACEMENT: SHX5789

## 2022-03-16 LAB — COMPREHENSIVE METABOLIC PANEL
ALT: 13 U/L (ref 0–44)
AST: 15 U/L (ref 15–41)
Albumin: 3.1 g/dL — ABNORMAL LOW (ref 3.5–5.0)
Alkaline Phosphatase: 105 U/L (ref 38–126)
Anion gap: 13 (ref 5–15)
BUN: 25 mg/dL — ABNORMAL HIGH (ref 8–23)
CO2: 20 mmol/L — ABNORMAL LOW (ref 22–32)
Calcium: 8.7 mg/dL — ABNORMAL LOW (ref 8.9–10.3)
Chloride: 102 mmol/L (ref 98–111)
Creatinine, Ser: 1.79 mg/dL — ABNORMAL HIGH (ref 0.44–1.00)
GFR, Estimated: 32 mL/min — ABNORMAL LOW (ref >60)
Glucose, Bld: 105 mg/dL — ABNORMAL HIGH (ref 70–99)
Potassium: 2.7 mmol/L — CL (ref 3.5–5.1)
Sodium: 135 mmol/L (ref 135–145)
Total Bilirubin: 0.4 mg/dL (ref 0.3–1.2)
Total Protein: 8 g/dL (ref 6.5–8.1)

## 2022-03-16 LAB — CBC
HCT: 36 % (ref 36.0–46.0)
Hemoglobin: 11.3 g/dL — ABNORMAL LOW (ref 12.0–15.0)
MCH: 28.3 pg (ref 26.0–34.0)
MCHC: 31.4 g/dL (ref 30.0–36.0)
MCV: 90.2 fL (ref 80.0–100.0)
Platelets: 202 10*3/uL (ref 150–400)
RBC: 3.99 MIL/uL (ref 3.87–5.11)
RDW: 13.6 % (ref 11.5–15.5)
WBC: 13.9 10*3/uL — ABNORMAL HIGH (ref 4.0–10.5)
nRBC: 0 % (ref 0.0–0.2)

## 2022-03-16 LAB — URINALYSIS, ROUTINE W REFLEX MICROSCOPIC
Bilirubin Urine: NEGATIVE
Glucose, UA: NEGATIVE mg/dL
Ketones, ur: NEGATIVE mg/dL
Nitrite: NEGATIVE
Protein, ur: 100 mg/dL — AB
Specific Gravity, Urine: 1.02 (ref 1.005–1.030)
WBC, UA: >50 WBC/hpf — ABNORMAL HIGH (ref 0–5)
pH: 5 (ref 5.0–8.0)

## 2022-03-16 LAB — RESP PANEL BY RT-PCR (RSV, FLU A&B, COVID)  RVPGX2
Influenza A by PCR: NEGATIVE
Influenza B by PCR: NEGATIVE
Resp Syncytial Virus by PCR: NEGATIVE
SARS Coronavirus 2 by RT PCR: NEGATIVE

## 2022-03-16 LAB — MAGNESIUM: Magnesium: 1.8 mg/dL (ref 1.7–2.4)

## 2022-03-16 LAB — LIPASE, BLOOD: Lipase: 30 U/L (ref 11–51)

## 2022-03-16 SURGERY — CYSTOURETEROSCOPY, WITH RETROGRADE PYELOGRAM AND STENT INSERTION
Anesthesia: General | Site: Ureter | Laterality: Right

## 2022-03-16 MED ORDER — PROCHLORPERAZINE EDISYLATE 10 MG/2ML IJ SOLN
10.0000 mg | Freq: Once | INTRAMUSCULAR | Status: AC
  Administered 2022-03-16: 10 mg via INTRAVENOUS
  Filled 2022-03-16: qty 2

## 2022-03-16 MED ORDER — POTASSIUM CHLORIDE 10 MEQ/100ML IV SOLN
10.0000 meq | INTRAVENOUS | Status: AC
  Administered 2022-03-16 (×3): 10 meq via INTRAVENOUS
  Filled 2022-03-16 (×3): qty 100

## 2022-03-16 MED ORDER — SODIUM CHLORIDE 0.9 % IV SOLN
1.0000 g | Freq: Once | INTRAVENOUS | Status: AC
  Administered 2022-03-16: 1 g via INTRAVENOUS
  Filled 2022-03-16: qty 10

## 2022-03-16 MED ORDER — ACETAMINOPHEN 325 MG PO TABS
650.0000 mg | ORAL_TABLET | Freq: Once | ORAL | Status: AC
  Administered 2022-03-16: 650 mg via ORAL
  Filled 2022-03-16: qty 2

## 2022-03-16 MED ORDER — KETOROLAC TROMETHAMINE 15 MG/ML IJ SOLN: 15.0000 mg | Freq: Once | INTRAMUSCULAR | Status: DC

## 2022-03-16 MED ORDER — SODIUM CHLORIDE 0.9 % IV SOLN: Freq: Once | INTRAVENOUS | Status: AC

## 2022-03-16 MED ORDER — POTASSIUM CHLORIDE 20 MEQ PO PACK
40.0000 meq | PACK | Freq: Once | ORAL | Status: AC
  Administered 2022-03-16: 40 meq via ORAL
  Filled 2022-03-16: qty 2

## 2022-03-16 MED ORDER — LACTATED RINGERS IV SOLN: INTRAVENOUS | Status: DC

## 2022-03-16 MED ORDER — ONDANSETRON HCL 4 MG/2ML IJ SOLN
4.0000 mg | Freq: Once | INTRAMUSCULAR | Status: AC
  Administered 2022-03-16: 4 mg via INTRAVENOUS
  Filled 2022-03-16: qty 2

## 2022-03-16 MED ORDER — SODIUM CHLORIDE 0.9 % IV BOLUS
1000.0000 mL | Freq: Once | INTRAVENOUS | Status: AC
  Administered 2022-03-16: 1000 mL via INTRAVENOUS

## 2022-03-16 MED ORDER — VANCOMYCIN HCL 125 MG PO CAPS
125.0000 mg | ORAL_CAPSULE | Freq: Four times a day (QID) | ORAL | Status: DC
  Administered 2022-03-16 – 2022-03-19 (×10): 125 mg via ORAL
  Filled 2022-03-16 (×13): qty 1

## 2022-03-16 MED ORDER — DIPHENHYDRAMINE HCL 50 MG/ML IJ SOLN
25.0000 mg | Freq: Once | INTRAMUSCULAR | Status: AC
  Administered 2022-03-16: 25 mg via INTRAVENOUS
  Filled 2022-03-16: qty 1

## 2022-03-16 SURGICAL SUPPLY — 27 items
BAG URO CATCHER STRL LF (MISCELLANEOUS) ×1 IMPLANT
BASKET ZERO TIP NITINOL 2.4FR (BASKET) IMPLANT
BSKT STON RTRVL ZERO TP 2.4FR (BASKET)
BULB IRRIG PATHFIND (MISCELLANEOUS) ×2 IMPLANT
CATH URETL OPEN 5X70 (CATHETERS) IMPLANT
CLOTH BEACON ORANGE TIMEOUT ST (SAFETY) ×1 IMPLANT
CONT SPEC DRY SEAL-RITE 20ML (COLLECTOR) IMPLANT
EXTRACTOR STONE 1.7FRX115CM (UROLOGICAL SUPPLIES) IMPLANT
GLOVE BIO SURGEON STRL SZ8 (GLOVE) IMPLANT
GLOVE BIOGEL PI IND STRL 7.5 (GLOVE) IMPLANT
GLOVE BIOGEL PI IND STRL 8 (GLOVE) IMPLANT
GLOVE SURG LX STRL 7.5 STRW (GLOVE) ×1 IMPLANT
GOWN STRL REUS W/ TWL XL LVL3 (GOWN DISPOSABLE) ×1 IMPLANT
GOWN STRL REUS W/TWL XL LVL3 (GOWN DISPOSABLE) ×2
GUIDEWIRE ANG ZIPWIRE 038X150 (WIRE) IMPLANT
GUIDEWIRE STR DUAL SENSOR (WIRE) ×2 IMPLANT
KIT TURNOVER KIT A (KITS) IMPLANT
LASER FIB FLEXIVA PULSE ID 365 (Laser) IMPLANT
MANIFOLD NEPTUNE II (INSTRUMENTS) ×2 IMPLANT
PACK CYSTO (CUSTOM PROCEDURE TRAY) ×2 IMPLANT
SHEATH NAVIGATOR HD 11/13X36 (SHEATH) IMPLANT
STENT URET 6FRX24 CONTOUR (STENTS) IMPLANT
SYR 20ML LL LF (SYRINGE) ×1 IMPLANT
TRACTIP FLEXIVA PULS ID 200XHI (Laser) IMPLANT
TRACTIP FLEXIVA PULSE ID 200 (Laser)
TUBING CONNECTING 10 (TUBING) ×1 IMPLANT
TUBING UROLOGY SET (TUBING) ×2 IMPLANT

## 2022-03-16 NOTE — Anesthesia Preprocedure Evaluation (Signed)
Anesthesia Evaluation  Patient identified by MRN, date of birth, ID band Patient awake    Reviewed: Allergy & Precautions, NPO status , Patient's Chart, lab work & pertinent test results  Airway Mallampati: II  TM Distance: >3 FB Neck ROM: Full    Dental   Pulmonary neg pulmonary ROS   Pulmonary exam normal        Cardiovascular hypertension, Pt. on medications and Pt. on home beta blockers Normal cardiovascular exam     Neuro/Psych  Headaches    GI/Hepatic Neg liver ROS,GERD  ,,  Endo/Other  negative endocrine ROS    Renal/GU Renal disease     Musculoskeletal   Abdominal   Peds  Hematology  (+) Blood dyscrasia, anemia   Anesthesia Other Findings   Reproductive/Obstetrics                             Anesthesia Physical Anesthesia Plan  ASA: 2 and emergent  Anesthesia Plan: General   Post-op Pain Management:    Induction: Intravenous and Rapid sequence  PONV Risk Score and Plan: 3 and Dexamethasone, Ondansetron and Treatment may vary due to age or medical condition  Airway Management Planned: Oral ETT  Additional Equipment:   Intra-op Plan:   Post-operative Plan: Extubation in OR  Informed Consent: I have reviewed the patients History and Physical, chart, labs and discussed the procedure including the risks, benefits and alternatives for the proposed anesthesia with the patient or authorized representative who has indicated his/her understanding and acceptance.       Plan Discussed with:   Anesthesia Plan Comments:        Anesthesia Quick Evaluation

## 2022-03-16 NOTE — ED Provider Notes (Signed)
Medical screening examination/treatment/procedure(s) were conducted as a shared visit with non-physician practitioner(s) and myself.  I personally evaluated the patient during the encounter.  Clinical Impression:   Final diagnoses:  Sepsis without acute organ dysfunction, due to unspecified organism [facility])  Kidney stone    This patient is a 63 year old female presenting with a fever, she also has signs of a urinary tract infection with greater than 50 white blood cells, many bacteria, she is hypokalemic at 2.7 and has a leukocytosis of over 13,900.  She is negative for COVID and the flu and a CT scan confirms that the patient does not fact have what appears to be a distal right ureteral stone.  As this patient has sepsis criteria, history of C. difficile and is now febrile with abdominal pain persistent diarrhea and what appears to be pyelonephritis with an infected stone she will need to be admitted.  Urology was consulted and request transfer from ER to [facility], ER.  They will see them on arrival, hospitalist will be consulted for admission.  EKG performed on March 16, 2022 at 8:19 PM showing normal sinus rhythm rate of 99 bpm, leftward axis, left anterior fascicular block, mild left ventricular hypertrophy is present, normal ST segments, normal T waves, otherwise unremarkable EKG.  The patient is septic, critical care provided, agree with physician assistant documentation, fluid resuscitation and antibiotics.  .Critical Care  Performed by: Noemi Chapel, MD Authorized by: Noemi Chapel, MD   Critical care provider statement:    Critical care time (minutes):  45   Critical care time was exclusive of:  Separately billable procedures and treating other patients and teaching time   Critical care was necessary to treat or prevent imminent or life-threatening deterioration of the following conditions:  Sepsis   Critical care was time spent personally by me on the following activities:   Development of treatment plan with patient or surrogate, discussions with consultants, evaluation of patient's response to treatment, examination of patient, obtaining history from patient or surrogate, review of old charts, re-evaluation of patient's condition, pulse oximetry, ordering and review of radiographic studies, ordering and review of laboratory studies and ordering and performing treatments and interventions Comments:      .  Final diagnoses:  Sepsis without acute organ dysfunction, due to unspecified organism [facility])  Kidney stone        Noemi Chapel, MD 03/16/22 [id number]

## 2022-03-16 NOTE — ED Notes (Signed)
Pt updated on plan of care by [place], [place], informs pt and pt spouse that pt will be transferred to [facility]

## 2022-03-16 NOTE — ED Notes (Signed)
Rip [place], [facility] aware of need for PO vanc

## 2022-03-16 NOTE — H&P (Signed)
History and Physical    Patient: Sheryl Lowe ZJI:[id number] DOB: 1960/09/25 DOA: 03/16/2022 DOS: the patient was seen and examined on 03/17/2022 PCP: Chapell, Waldron Session, NP  Patient coming from: Home  Chief Complaint:  Chief Complaint  Patient presents with   Emesis   HPI: Sheryl Lowe is a 62 y.o. female with medical history significant of hypertension, hyperlipidemia, GERD, prediabetes, nephrolithiasis, recurrent C. difficile infection who presents to the emergency department accompanied by husband due to 4-day onset of having intermittent fever with temperature as high as 102F, chills, diarrhea, vomiting.  Initially patient thought symptoms were due to flu since she was exposed to her husband who tested positive for influenza and was being treated for Tamiflu.  She Later on thought symptoms may be related to recurrent C. difficile, she saw her PCP on Monday (1/8), she was encouraged to start taking oral vancomycin and patient is already taking about 6 doses.  She continues to have intermittent fever, weakness, chills, so she decided to go to the ED for further evaluation and management.  She endorsed right-sided low back pain for a short period about a week ago, but denies back pain since onset of symptoms 4 days ago.  ED Course:  In the emergency department, she was febrile with a temperature of 101.72F, but other vital signs are within normal range.  Workup in the ED showed normal CBC except for leukocytosis, BMP showed sodium 135, potassium 2.7, bicarb 20, blood glucose 105, BUN/creatinine 25/1.79 (baseline creatinine at 0.7-1.0), albumin 3.1.  C. difficile antigen was positive but C. difficile toxin was negative with results being indeterminate.  Influenza A, B, SARS coronavirus 2, RSV was negative. CT abdomen and pelvis without contrast showed 3 mm stone in the distal right ureter with mild right hydronephrosis Urologist on-call was consulted and requested transfer from ER to [facility], ER  with plan to see patient on arrival.  Hospitalist was asked to admit patient for further evaluation and management.  Review of Systems: Review of systems as noted in the HPI. All other systems reviewed and are negative.   Past Medical History:  Diagnosis Date   Anxiety    Clostridium difficile infection    Hyperlipidemia    Hypertension    Kidney stones    Migraine    Past Surgical History:  Procedure Laterality Date   BIOPSY  02/04/2021   Procedure: BIOPSY;  Surgeon: Lanelle Bal, DO;  Location: [facility];  Service: Endoscopy;;   CHOLECYSTECTOMY  2020   COLONOSCOPY WITH PROPOFOL N/A 02/04/2021   nonbleeding internal hemorrhoids, exam otherwise normal.  Repeat in 10 years   ESOPHAGOGASTRODUODENOSCOPY (EGD) WITH PROPOFOL N/A 02/04/2021   esophageal mucosal changes suspicious for short segment Barrett's esophagus (intestinal metaplasia), gastritis (mild nonspecific reactive gastropathy), normal duodenum, small hiatal hernia. Repeat EGD in 3 years.    Social History:  reports that she has never smoked. She has never used smokeless tobacco. She reports that she does not currently use alcohol. She reports that she does not use drugs.   Allergies  Allergen Reactions   Amitriptyline     Drowsiness in the morning   Topamax [Topiramate]     Memory loss, tingling in hands and feet    Family History  Problem Relation Age of Onset   Stroke Mother    Heart disease Mother    Hypertension Father    Cancer Father    Colon cancer Neg Hx    Colon polyps Neg Hx  Prior to Admission medications   Medication Sig Start Date End Date Taking? Authorizing Provider  atenolol (TENORMIN) 50 MG tablet Take 50 mg by mouth daily. 03/09/22  Yes [provider]  atorvastatin (LIPITOR) 10 MG tablet Take 1 tablet (10 mg total) by mouth daily. Need f/u visit for additional refills 11/06/20  Yes Burns, Claudina Lick, MD  omeprazole (PRILOSEC) 20 MG capsule Take 1 capsule (20 mg total) by  mouth 2 (two) times daily before a meal. Patient taking differently: Take 20 mg by mouth daily. 02/04/21 03/16/22 Yes Carver, Charles K, DO  ondansetron (ZOFRAN-ODT) 4 MG disintegrating tablet Take 4 mg by mouth every 8 (eight) hours. 03/13/22  Yes [provider]  valsartan (DIOVAN) 80 MG tablet Take 80 mg by mouth daily. 11/16/21  Yes [provider]    Physical Exam: BP 121/72 (BP Location: Left Arm)   Pulse 83   Temp 98.5 F (36.9 C) (Oral)   Resp (!) 21   Ht 5' (1.524 m)   Wt 67.6 kg   SpO2 95%   BMI 29.10 kg/m   General: 62 y.o. year-old female well developed well nourished in no acute distress.  Alert and oriented x3. HEENT: NCAT, EOMI, dry mucous membrane Neck: Supple, trachea medial Cardiovascular: Regular rate and rhythm with no rubs or gallops.  No thyromegaly or JVD noted.  No lower extremity edema. 2/4 pulses in all 4 extremities. Respiratory: Clear to auscultation with no wheezes or rales. Good inspiratory effort. Abdomen: Soft, nontender nondistended with normal bowel sounds x4 quadrants. Muskuloskeletal: No cyanosis, clubbing or edema noted bilaterally Neuro: CN II-XII intact, strength 5/5 x 4, sensation, reflexes intact Skin: No ulcerative lesions noted or rashes Psychiatry: Judgement and insight appear normal. Mood is appropriate for condition and setting          Labs on Admission:  Basic Metabolic Panel: Recent Labs  Lab 03/16/22 1751 03/17/22 0354  NA 135 139  K 2.7* 3.6  CL 102 113*  CO2 20* 17*  GLUCOSE 105* 123*  BUN 25* 19  CREATININE 1.79* 1.39*  CALCIUM 8.7* 8.3*  MG 1.8 1.6*  PHOS  --  2.3*   Liver Function Tests: Recent Labs  Lab 03/16/22 1751 03/17/22 0354  AST 15 13*  ALT 13 13  ALKPHOS 105 90  BILITOT 0.4 0.2*  PROT 8.0 7.3  ALBUMIN 3.1* 2.7*   Recent Labs  Lab 03/16/22 1751  LIPASE 30   No results for input(s): "AMMONIA" in the last 168 hours. CBC: Recent Labs  Lab 03/16/22 1751 03/17/22 0354  WBC  13.9* 12.0*  HGB 11.3* 10.2*  HCT 36.0 32.2*  MCV 90.2 90.4  PLT 202 176   Cardiac Enzymes: No results for input(s): "CKTOTAL", "CKMB", "CKMBINDEX", "TROPONINI" in the last 168 hours.  BNP (last 3 results) No results for input(s): "BNP" in the last 8760 hours.  ProBNP (last 3 results) No results for input(s): "PROBNP" in the last 8760 hours.  CBG: No results for input(s): "GLUCAP" in the last 168 hours.  Radiological Exams on Admission: DG C-Arm 1-60 Min-No Report  Result Date: 03/17/2022 Fluoroscopy was utilized by the requesting physician.  No radiographic interpretation.   CT Renal Stone Study  Result Date: 03/16/2022 CLINICAL DATA:  Abdominal/flank pain with chills nausea, vomiting and diarrhea. History of C diff EXAM: CT ABDOMEN AND PELVIS WITHOUT CONTRAST TECHNIQUE: Multidetector CT imaging of the abdomen and pelvis was performed following the standard protocol without IV contrast. RADIATION DOSE REDUCTION: This exam  was performed according to the departmental dose-optimization program which includes automated exposure control, adjustment of the mA and/or kV according to patient size and/or use of iterative reconstruction technique. COMPARISON:  12/13/2020 FINDINGS: Lower chest: No acute abnormality. Hepatobiliary: No focal liver abnormality is seen. No gallstones, gallbladder wall thickening, or biliary dilatation. Pancreas: Unremarkable. No pancreatic ductal dilatation or surrounding inflammatory changes. Spleen: Normal in size without focal abnormality. Adrenals/Urinary Tract: Unremarkable adrenal glands. 3 mm stone in the distal right ureter. Mild right hydroureteronephrosis. Unremarkable bladder. Stomach/Bowel: Colonic diverticulosis without diverticulitis. No bowel wall thickening or inflammatory change. Fatty infiltration of the wall of the sigmoid colon and rectum compatible with chronic inflammation normal caliber large and small bowel. Normal appendix. Unremarkable stomach.  Small hiatal hernia. Vascular/Lymphatic: Aortic atherosclerosis. No enlarged abdominal or pelvic lymph nodes. Reproductive: Uterus and bilateral adnexa are unremarkable. Other: No free intraperitoneal fluid or air. Musculoskeletal: No acute or significant osseous findings. IMPRESSION: 3 mm stone in the distal right ureter with mild right hydronephrosis. Colonic diverticulosis without diverticulitis. No evidence of colitis. Electronically Signed   By: Placido Sou M.D.   On: 03/16/2022 21:16    EKG: I independently viewed the EKG done and my findings are as followed: EKG was not done in the ED  Assessment/Plan Present on Admission:  Acute pyelonephritis  C. difficile diarrhea  Essential hypertension, benign  Mixed hyperlipidemia  GERD (gastroesophageal reflux disease)  Prediabetes  Principal Problem:   Sepsis secondary to UTI [facility]) Active Problems:   Mixed hyperlipidemia   Essential hypertension, benign   GERD (gastroesophageal reflux disease)   Prediabetes   C. difficile diarrhea   Acute pyelonephritis   Nephrolithiasis   Hypokalemia   AKI (acute kidney injury) ([place])   Hypomagnesemia   Hypophosphatemia   Hypoalbuminemia due to protein-calorie malnutrition ([place])  Sepsis possibly secondary to UTI POA Nephrolithiasis with possible superimposed acute pyelonephritis CT abdomen and pelvis without contrast showed 3 mm stone in the distal right ureter with mild right hydronephrosis Patient was febrile and presented with leukocytosis with source of infection being the kidney, thereby meeting sepsis criteria. She was started on IV ceftriaxone, we shall continue with same at this time Continue Tylenol as needed Urine culture pending Urology was consulted and patient will be transferred ED to ED to [facility] with plan for urology team to consult on patient on arrival to [facility].  Hypokalemia K+ 2.7, this was replenished  Acute kidney injury BUN/creatinine 25/1.79 (baseline  creatinine at 0.7-1.0) Continue IV hydration Renally adjust medications, avoid nephrotoxic agents/dehydration/hypotension  Diarrhea with suspicion for C. Difficile Continue oral vancomycin  Hypomagnesemia Magnesium 1.6, this will be replenished  Hypophosphatemia Phosphorus 2.3, this will be replenished  Hypoalbuminemia possibly secondary to mild protein caloric malnutrition Hypokalemia 3.1, protein supplement will be provided  Essential hypertension Continue atenolol and valsartan  Mixed hyperlipidemia Continue Lipitor  GERD Continue Protonix  Prediabetes Hemoglobin A1c about a year ago was 6.3 Hemoglobin A1c will be checked prior to starting patient on any medication regimen  DVT prophylaxis: SCDs  Code Status: Full code  Family Communication: None at bedside  Consults: Urology   Severity of Illness: The appropriate patient status for this patient is INPATIENT. Inpatient status is judged to be reasonable and necessary in order to provide the required intensity of service to ensure the patient's safety. The patient's presenting symptoms, physical exam findings, and initial radiographic and laboratory data in the context of their chronic comorbidities is felt to place them at high risk  for further clinical deterioration. Furthermore, it is not anticipated that the patient will be medically stable for discharge from the hospital within 2 midnights of admission.   * I certify that at the point of admission it is my clinical judgment that the patient will require inpatient hospital care spanning beyond 2 midnights from the point of admission due to high intensity of service, high risk for further deterioration and high frequency of surveillance required.*  Author: Frankey Shown, DO 03/17/2022 5:01 AM  For on call review [email or web address].

## 2022-03-16 NOTE — ED Triage Notes (Signed)
Pt with chills since Sunday night, N/V and mild diarrhea.  Pt with hx of c-diff.  Seen PCP Monday, started pt on Tamiflu and nausea medication Zofran.  Alternate tylenol and motrin.  Tuesday morning thinks she may c-diff again due to color and smell.  Recent antibiotic use

## 2022-03-16 NOTE — ED Provider Notes (Signed)
[facility] EMERGENCY DEPARTMENT Provider Note   CSN: [id number] Arrival date & time: 03/16/22  1615     History  Chief Complaint  Patient presents with   Emesis    Sheryl Lowe is a 62 y.o. female with past medical history significant for hyperlipidemia, hypertension, acid reflux, diabetes, anxiety, with previous history of recurrent C. difficile who returns with concern for nausea, vomiting, chills, and mild diarrhea.  Patient reports that it feels similar to previous C. difficile episodes because of the quality of her stool, she reports 1 episode of mucousy greenish stool, and then loose diarrhea stool since then.  She does report worse smell such as previous C. difficile treatments.  Patient reports that she saw her PCP on Monday, was exposed to husband who was flu positive, was treated with Tamiflu, Zofran, his concern for dehydration.  He continues to have body aches, weakness, nausea, intermittent fever, and concern for possible recurrent C. difficile.  After speaking to her PCP she was encouraged to begin taking oral vancomycin again, patient reports that she took 6 doses of oral vancomycin 250mg  so far, starting yesterday morning.  She reports that she took Macrobid in November without taking her oral vancomycin, but denies any other antibiotic use.   Emesis Associated symptoms: diarrhea        Home Medications Prior to Admission medications   Medication Sig Start Date End Date Taking? Authorizing Provider  atenolol (TENORMIN) 50 MG tablet Take 50 mg by mouth daily. 03/09/22  Yes [provider]  atorvastatin (LIPITOR) 10 MG tablet Take 1 tablet (10 mg total) by mouth daily. Need f/u visit for additional refills 11/06/20  Yes Burns, Claudina Lick, MD  omeprazole (PRILOSEC) 20 MG capsule Take 1 capsule (20 mg total) by mouth 2 (two) times daily before a meal. Patient taking differently: Take 20 mg by mouth daily. 02/04/21 03/16/22 Yes Carver, Charles K, DO  ondansetron  (ZOFRAN-ODT) 4 MG disintegrating tablet Take 4 mg by mouth every 8 (eight) hours. 03/13/22  Yes [provider]  valsartan (DIOVAN) 80 MG tablet Take 80 mg by mouth daily. 11/16/21  Yes [provider]      Allergies    Amitriptyline and Topamax [topiramate]    Review of Systems   Review of Systems  Gastrointestinal:  Positive for diarrhea and vomiting.  Neurological:  Positive for weakness.  All other systems reviewed and are negative.   Physical Exam Updated Vital Signs BP (!) 106/56   Pulse 86   Temp 99.2 F (37.3 C) (Oral)   Resp 18   Ht 5' (1.524 m)   Wt 67.6 kg   SpO2 96%   BMI 29.10 kg/m  Physical Exam Vitals and nursing note reviewed.  Constitutional:      General: She is not in acute distress.    Appearance: Normal appearance.     Comments: Somewhat anxious, nonseptic, nontoxic appearing  HENT:     Head: Normocephalic and atraumatic.     Mouth/Throat:     Mouth: Mucous membranes are dry.  Eyes:     General:        Right eye: No discharge.        Left eye: No discharge.  Cardiovascular:     Rate and Rhythm: Normal rate and regular rhythm.     Heart sounds: No murmur heard.    No friction rub. No gallop.  Pulmonary:     Effort: Pulmonary effort is normal.     Breath sounds: Normal  breath sounds.  Abdominal:     General: Bowel sounds are normal.     Palpations: Abdomen is soft.  Skin:    General: Skin is warm and dry.     Capillary Refill: Capillary refill takes less than 2 seconds.  Neurological:     Mental Status: She is alert and oriented to person, place, and time.  Psychiatric:        Mood and Affect: Mood normal.        Behavior: Behavior normal.     ED Results / Procedures / Treatments   Labs (all labs ordered are listed, but only abnormal results are displayed) Labs Reviewed  COMPREHENSIVE METABOLIC PANEL - Abnormal; Notable for the following components:      Result Value   Potassium 2.7 (*)    CO2 20 (*)    Glucose,  Bld 105 (*)    BUN 25 (*)    Creatinine, Ser 1.79 (*)    Calcium 8.7 (*)    Albumin 3.1 (*)    GFR, Estimated 32 (*)    All other components within normal limits  CBC - Abnormal; Notable for the following components:   WBC 13.9 (*)    Hemoglobin 11.3 (*)    All other components within normal limits  URINALYSIS, ROUTINE W REFLEX MICROSCOPIC - Abnormal; Notable for the following components:   APPearance HAZY (*)    Hgb urine dipstick SMALL (*)    Protein, ur 100 (*)    Leukocytes,Ua MODERATE (*)    WBC, UA >50 (*)    Bacteria, UA MANY (*)    All other components within normal limits  RESP PANEL BY RT-PCR (RSV, FLU A&B, COVID)  RVPGX2  C DIFFICILE QUICK SCREEN W PCR REFLEX    LIPASE, BLOOD  MAGNESIUM    EKG None  Radiology CT Renal Stone Study  Result Date: 03/16/2022 CLINICAL DATA:  Abdominal/flank pain with chills nausea, vomiting and diarrhea. History of C diff EXAM: CT ABDOMEN AND PELVIS WITHOUT CONTRAST TECHNIQUE: Multidetector CT imaging of the abdomen and pelvis was performed following the standard protocol without IV contrast. RADIATION DOSE REDUCTION: This exam was performed according to the departmental dose-optimization program which includes automated exposure control, adjustment of the mA and/or kV according to patient size and/or use of iterative reconstruction technique. COMPARISON:  12/13/2020 FINDINGS: Lower chest: No acute abnormality. Hepatobiliary: No focal liver abnormality is seen. No gallstones, gallbladder wall thickening, or biliary dilatation. Pancreas: Unremarkable. No pancreatic ductal dilatation or surrounding inflammatory changes. Spleen: Normal in size without focal abnormality. Adrenals/Urinary Tract: Unremarkable adrenal glands. 3 mm stone in the distal right ureter. Mild right hydroureteronephrosis. Unremarkable bladder. Stomach/Bowel: Colonic diverticulosis without diverticulitis. No bowel wall thickening or inflammatory change. Fatty infiltration of the  wall of the sigmoid colon and rectum compatible with chronic inflammation normal caliber large and small bowel. Normal appendix. Unremarkable stomach. Small hiatal hernia. Vascular/Lymphatic: Aortic atherosclerosis. No enlarged abdominal or pelvic lymph nodes. Reproductive: Uterus and bilateral adnexa are unremarkable. Other: No free intraperitoneal fluid or air. Musculoskeletal: No acute or significant osseous findings. IMPRESSION: 3 mm stone in the distal right ureter with mild right hydronephrosis. Colonic diverticulosis without diverticulitis. No evidence of colitis. Electronically Signed   By: Placido Sou M.D.   On: 03/16/2022 21:16    Procedures .Critical Care  Performed by: Anselmo Pickler, PA-C Authorized by: Anselmo Pickler, PA-C   Critical care provider statement:    Critical care time (minutes):  35   Critical  care was necessary to treat or prevent imminent or life-threatening deterioration of the following conditions:  Sepsis   Critical care was time spent personally by me on the following activities:  Development of treatment plan with patient or surrogate, discussions with consultants, evaluation of patient's response to treatment, examination of patient, ordering and review of laboratory studies, ordering and review of radiographic studies, ordering and performing treatments and interventions, pulse oximetry, re-evaluation of patient's condition and review of old charts   Care discussed with: admitting provider       Medications Ordered in ED Medications  potassium chloride 10 mEq in 100 mL IVPB (0 mEq Intravenous Stopped 03/16/22 2238)  ketorolac (TORADOL) 15 MG/ML injection 15 mg (15 mg Intravenous Not Given 03/16/22 2022)  vancomycin (VANCOCIN) capsule 125 mg (125 mg Oral Given 03/16/22 2204)  sodium chloride 0.9 % bolus 1,000 mL (0 mLs Intravenous Stopped 03/16/22 2012)  ondansetron (ZOFRAN) injection 4 mg (4 mg Intravenous Given 03/16/22 1817)  acetaminophen  (TYLENOL) tablet 650 mg (650 mg Oral Given 03/16/22 2009)  prochlorperazine (COMPAZINE) injection 10 mg (10 mg Intravenous Given 03/16/22 2013)  diphenhydrAMINE (BENADRYL) injection 25 mg (25 mg Intravenous Given 03/16/22 2016)  potassium chloride (KLOR-CON) packet 40 mEq (40 mEq Oral Given 03/16/22 2010)  cefTRIAXone (ROCEPHIN) 1 g in sodium chloride 0.9 % 100 mL IVPB (0 g Intravenous Stopped 03/16/22 2217)  0.9 %  sodium chloride infusion ( Intravenous New Bag/Given 03/16/22 2137)    ED Course/ Medical Decision Making/ A&P                           Medical Decision Making Amount and/or Complexity of Data Reviewed Labs: ordered. Radiology: ordered.  Risk OTC drugs. Prescription drug management. Decision regarding hospitalization.   This patient is a 62 y.o. female who presents to the ED for concern of nausea, vomiting, malaise, diarrhea, this involves an extensive number of treatment options, and is a complaint that carries with it a high risk of complications and morbidity. The emergent differential diagnosis prior to evaluation includes, but is not limited to,  flu, covid, uti, pyelonephritis, kidney stone, sepsis, cdiff, vs other. This is not an exhaustive differential.   Past Medical History / Co-morbidities / Social History: Previous history of diabetes, anxiety, multiple recurrent C. difficile, hyperlipidemia, hypertension.   Additional history: Chart reviewed. Pertinent results include: Reviewed recent previous GI, endoscopy, outpatient PCP visits, notably patient recently began taking her oral vancomycin again  Physical Exam: Physical exam performed. The pertinent findings include: Patient appears somewhat dry, and is anxious but otherwise no acute distress, she is nontender to palpation of the abdomen.  Patient does meet sepsis criteria with intermittent tachycardia, Tmax of 101.1 despite receiving Tylenol on arrival, she had some tachypnea on arrival which is resolved, she has  been stable on room air for oxygen saturation.  Her temperature is improved after Tylenol and fluids.  Lab Tests: I ordered, and personally interpreted labs.  The pertinent results include: Patient does have a notable leukocytosis, blood cell 13.9, she is mildly anemic with hemoglobin 11.3.  RVP negative for COVID, flu, lipase unremarkable.  Her urinalysis does appear infected with white blood cell clumps, greater than 50 white blood cells, many bacteria, moderate leukocytes, magnesium in normal range, her CMP is notable for hypokalemia potassium 2.7, she has an AKI with creatinine of 1.79 from baseline around 0.8   Imaging Studies: I ordered imaging studies including CT renal stone study.  I independently visualized and interpreted imaging which showed 3 mm distal right ureteral stone without significant hydronephrosis. I agree with the radiologist interpretation.   Cardiac Monitoring:  The patient was maintained on a cardiac monitor.  My attending physician Dr. Hyacinth Meeker viewed and interpreted the cardiac monitored which showed an underlying rhythm of: NSR with LVH. I agree with this interpretation.   Medications: I ordered medication including oral Vanco for questionable presumed C. difficile, ceftriaxone for infected kidney stone, fluids, Toradol, Tylenol for fever, dehydration, potassium for hypokalemia.  Patient with some improvement of her fever but overall remains ill-appearing, he met sepsis criteria with infected stone, will consult urology given these findings, she has not been able to provide [place] a stool sample yet due to an error in collecting stool sample and having her initial stool sample being lost, but will pend and recollect C. difficile as available.  Consultations Obtained: I requested consultation with the Urologist, spoke with Dr. Benancio Deeds, hospitalist, spoke with Dr. Thomes Dinning, spoke with Dr. Rubin Payor at [facility] for ED to ED transfer in anticipation of need for stent,  and  discussed lab and imaging findings as well as pertinent plan - they recommend: Patient will need ED to ED transfer for urologic placement of stent in context of her active pyelonephritis with right-sided stone, she will be admitted to the hospital thereafter, ED physician accepts patient in transfer in the meantime until patient has a bed available   Disposition: After consideration of the diagnostic results and the patients response to treatment, I feel that patient would benefit from admission, urologic intervention as discussed above.   I discussed this case with my attending physician Dr. Hyacinth Meeker who cosigned this note including patient's presenting symptoms, physical exam, and planned diagnostics and interventions. Attending physician stated agreement with plan or made changes to plan which were implemented.    Final Clinical Impression(s) / ED Diagnoses Final diagnoses:  Sepsis without acute organ dysfunction, due to unspecified organism [facility])  Kidney stone    Rx / DC Orders ED Discharge Orders     None         [place] 03/16/22 2249    Eber Hong, MD 03/16/22 2324

## 2022-03-16 NOTE — ED Notes (Signed)
Prosperi, Joesph Fillers, PA-C and this nurse at bedside as pt explains how she was not given "hat for stool specimen" when she told the nurse she had to use the bathroom, pt was given urine cup, previous nurse reported to this nurse that pt never informed that she needed to have BM, only needed to urinate, this was explained to pt by this nurse, pt responded, "well I didn't know until the pressure hit me". Explained that I understood this and hope that she understood that staff wasn't aware either, but to alleviate this problem in the future I would bring her supplies including hat, specimen cup, tongue depressor to collect sample out of hat and put into specimen cup and biohazard bag and gloves as pt expressed enormous concern for spreading C-diff to staff and others. Pt says she is still nauseated,but requesting Ice chips- PA plans to order more nausea meds- pt spouse at bedside- nurse brought pt ice chips even though nausea persists as this is what the pt expresses she wants. Pt also says she wasn't given anything for fever, although pt did not have fever when oral temp was checked- PA ordered tylenol as this is what pt stated she wants.

## 2022-03-17 ENCOUNTER — Telehealth (HOSPITAL_COMMUNITY): Payer: Self-pay | Admitting: Pharmacy Technician

## 2022-03-17 ENCOUNTER — Encounter (HOSPITAL_COMMUNITY): Payer: Self-pay | Admitting: Urology

## 2022-03-17 ENCOUNTER — Other Ambulatory Visit (HOSPITAL_COMMUNITY): Payer: Self-pay

## 2022-03-17 ENCOUNTER — Inpatient Hospital Stay (HOSPITAL_COMMUNITY): Payer: BC Managed Care – PPO | Admitting: Certified Registered Nurse Anesthetist

## 2022-03-17 ENCOUNTER — Inpatient Hospital Stay (HOSPITAL_COMMUNITY): Payer: BC Managed Care – PPO

## 2022-03-17 DIAGNOSIS — E876 Hypokalemia: Secondary | ICD-10-CM | POA: Insufficient documentation

## 2022-03-17 DIAGNOSIS — A419 Sepsis, unspecified organism: Secondary | ICD-10-CM

## 2022-03-17 DIAGNOSIS — N390 Urinary tract infection, site not specified: Secondary | ICD-10-CM | POA: Diagnosis not present

## 2022-03-17 DIAGNOSIS — N200 Calculus of kidney: Secondary | ICD-10-CM

## 2022-03-17 DIAGNOSIS — E8809 Other disorders of plasma-protein metabolism, not elsewhere classified: Secondary | ICD-10-CM | POA: Insufficient documentation

## 2022-03-17 DIAGNOSIS — N179 Acute kidney failure, unspecified: Secondary | ICD-10-CM | POA: Insufficient documentation

## 2022-03-17 LAB — CBC
HCT: 32.2 % — ABNORMAL LOW (ref 36.0–46.0)
Hemoglobin: 10.2 g/dL — ABNORMAL LOW (ref 12.0–15.0)
MCH: 28.7 pg (ref 26.0–34.0)
MCHC: 31.7 g/dL (ref 30.0–36.0)
MCV: 90.4 fL (ref 80.0–100.0)
Platelets: 176 10*3/uL (ref 150–400)
RBC: 3.56 MIL/uL — ABNORMAL LOW (ref 3.87–5.11)
RDW: 13.6 % (ref 11.5–15.5)
WBC: 12 10*3/uL — ABNORMAL HIGH (ref 4.0–10.5)
nRBC: 0 % (ref 0.0–0.2)

## 2022-03-17 LAB — HIV ANTIBODY (ROUTINE TESTING W REFLEX): HIV Screen 4th Generation wRfx: NONREACTIVE

## 2022-03-17 LAB — COMPREHENSIVE METABOLIC PANEL
ALT: 13 U/L (ref 0–44)
AST: 13 U/L — ABNORMAL LOW (ref 15–41)
Albumin: 2.7 g/dL — ABNORMAL LOW (ref 3.5–5.0)
Alkaline Phosphatase: 90 U/L (ref 38–126)
Anion gap: 9 (ref 5–15)
BUN: 19 mg/dL (ref 8–23)
CO2: 17 mmol/L — ABNORMAL LOW (ref 22–32)
Calcium: 8.3 mg/dL — ABNORMAL LOW (ref 8.9–10.3)
Chloride: 113 mmol/L — ABNORMAL HIGH (ref 98–111)
Creatinine, Ser: 1.39 mg/dL — ABNORMAL HIGH (ref 0.44–1.00)
GFR, Estimated: 43 mL/min — ABNORMAL LOW (ref >60)
Glucose, Bld: 123 mg/dL — ABNORMAL HIGH (ref 70–99)
Potassium: 3.6 mmol/L (ref 3.5–5.1)
Sodium: 139 mmol/L (ref 135–145)
Total Bilirubin: 0.2 mg/dL — ABNORMAL LOW (ref 0.3–1.2)
Total Protein: 7.3 g/dL (ref 6.5–8.1)

## 2022-03-17 LAB — C DIFFICILE QUICK SCREEN W PCR REFLEX
C Diff antigen: POSITIVE — AB
C Diff toxin: NEGATIVE

## 2022-03-17 LAB — HEMOGLOBIN A1C
Hgb A1c MFr Bld: 6.1 % — ABNORMAL HIGH (ref 4.8–5.6)
Mean Plasma Glucose: 128.37 mg/dL

## 2022-03-17 LAB — PHOSPHORUS: Phosphorus: 2.3 mg/dL — ABNORMAL LOW (ref 2.5–4.6)

## 2022-03-17 LAB — CLOSTRIDIUM DIFFICILE BY PCR, REFLEXED: Toxigenic C. Difficile by PCR: POSITIVE — AB

## 2022-03-17 LAB — MAGNESIUM: Magnesium: 1.6 mg/dL — ABNORMAL LOW (ref 1.7–2.4)

## 2022-03-17 MED ORDER — ONDANSETRON HCL 4 MG/2ML IJ SOLN
INTRAMUSCULAR | Status: DC | PRN
  Administered 2022-03-17: 4 mg via INTRAVENOUS

## 2022-03-17 MED ORDER — FENTANYL CITRATE PF 50 MCG/ML IJ SOSY: 25.0000 ug | PREFILLED_SYRINGE | INTRAMUSCULAR | Status: DC | PRN

## 2022-03-17 MED ORDER — PROMETHAZINE HCL 25 MG/ML IJ SOLN: 6.2500 mg | INTRAMUSCULAR | Status: DC | PRN

## 2022-03-17 MED ORDER — FENTANYL CITRATE (PF) 100 MCG/2ML IJ SOLN
INTRAMUSCULAR | Status: AC
  Filled 2022-03-17: qty 2

## 2022-03-17 MED ORDER — POTASSIUM PHOSPHATES 15 MMOLE/5ML IV SOLN
30.0000 mmol | Freq: Once | INTRAVENOUS | Status: AC
  Administered 2022-03-17: 30 mmol via INTRAVENOUS
  Filled 2022-03-17: qty 10

## 2022-03-17 MED ORDER — SODIUM CHLORIDE 0.9 % IV SOLN
INTRAVENOUS | Status: AC
  Filled 2022-03-17: qty 10

## 2022-03-17 MED ORDER — SODIUM CHLORIDE 0.9 % IV SOLN
2.0000 g | INTRAVENOUS | Status: DC
  Administered 2022-03-17 – 2022-03-19 (×3): 2 g via INTRAVENOUS
  Filled 2022-03-17 (×3): qty 20

## 2022-03-17 MED ORDER — DEXAMETHASONE SODIUM PHOSPHATE 10 MG/ML IJ SOLN
INTRAMUSCULAR | Status: DC | PRN
  Administered 2022-03-17: 10 mg via INTRAVENOUS

## 2022-03-17 MED ORDER — SODIUM CHLORIDE 0.9 % IR SOLN
Status: DC | PRN
  Administered 2022-03-17: 3000 mL

## 2022-03-17 MED ORDER — IOHEXOL 300 MG/ML  SOLN
INTRAMUSCULAR | Status: DC | PRN
  Administered 2022-03-17: 2 mL via URETHRAL

## 2022-03-17 MED ORDER — MIDAZOLAM HCL 2 MG/2ML IJ SOLN
INTRAMUSCULAR | Status: DC | PRN
  Administered 2022-03-17: 2 mg via INTRAVENOUS

## 2022-03-17 MED ORDER — DEXTROSE 5 % IV SOLN
INTRAVENOUS | Status: DC | PRN
  Administered 2022-03-17: 1 g via INTRAVENOUS

## 2022-03-17 MED ORDER — LACTATED RINGERS IV SOLN: INTRAVENOUS | Status: DC | PRN

## 2022-03-17 MED ORDER — ONDANSETRON HCL 4 MG/2ML IJ SOLN: 4.0000 mg | Freq: Four times a day (QID) | INTRAMUSCULAR | Status: DC | PRN

## 2022-03-17 MED ORDER — ACETAMINOPHEN 650 MG RE SUPP: 650.0000 mg | Freq: Four times a day (QID) | RECTAL | Status: DC | PRN

## 2022-03-17 MED ORDER — LIDOCAINE HCL (CARDIAC) PF 100 MG/5ML IV SOSY
PREFILLED_SYRINGE | INTRAVENOUS | Status: DC | PRN
  Administered 2022-03-17: 60 mg via INTRAVENOUS

## 2022-03-17 MED ORDER — PROPOFOL 10 MG/ML IV BOLUS
INTRAVENOUS | Status: AC
  Filled 2022-03-17: qty 20

## 2022-03-17 MED ORDER — GLUCERNA SHAKE PO LIQD
237.0000 mL | Freq: Three times a day (TID) | ORAL | Status: DC
  Administered 2022-03-18: 237 mL via ORAL
  Filled 2022-03-17 (×5): qty 237

## 2022-03-17 MED ORDER — IRBESARTAN 75 MG PO TABS
75.0000 mg | ORAL_TABLET | Freq: Every day | ORAL | Status: DC
  Filled 2022-03-17: qty 1

## 2022-03-17 MED ORDER — 0.9 % SODIUM CHLORIDE (POUR BTL) OPTIME
TOPICAL | Status: DC | PRN
  Administered 2022-03-17: 1000 mL

## 2022-03-17 MED ORDER — LIDOCAINE HCL (PF) 2 % IJ SOLN
INTRAMUSCULAR | Status: AC
  Filled 2022-03-17: qty 5

## 2022-03-17 MED ORDER — MAGNESIUM SULFATE 2 GM/50ML IV SOLN
2.0000 g | Freq: Once | INTRAVENOUS | Status: AC
  Administered 2022-03-17: 2 g via INTRAVENOUS
  Filled 2022-03-17: qty 50

## 2022-03-17 MED ORDER — ATENOLOL 50 MG PO TABS
50.0000 mg | ORAL_TABLET | Freq: Every day | ORAL | Status: DC
  Administered 2022-03-17 – 2022-03-20 (×4): 50 mg via ORAL
  Filled 2022-03-17 (×5): qty 1

## 2022-03-17 MED ORDER — PROPOFOL 10 MG/ML IV BOLUS
INTRAVENOUS | Status: DC | PRN
  Administered 2022-03-17: 120 mg via INTRAVENOUS
  Administered 2022-03-17: 50 mg via INTRAVENOUS

## 2022-03-17 MED ORDER — ONDANSETRON HCL 4 MG PO TABS: 4.0000 mg | ORAL_TABLET | Freq: Four times a day (QID) | ORAL | Status: DC | PRN

## 2022-03-17 MED ORDER — FENTANYL CITRATE (PF) 100 MCG/2ML IJ SOLN
INTRAMUSCULAR | Status: DC | PRN
  Administered 2022-03-17: 50 ug via INTRAVENOUS
  Administered 2022-03-17: 25 ug via INTRAVENOUS

## 2022-03-17 MED ORDER — MIDAZOLAM HCL 2 MG/2ML IJ SOLN
INTRAMUSCULAR | Status: AC
  Filled 2022-03-17: qty 2

## 2022-03-17 MED ORDER — SUCCINYLCHOLINE CHLORIDE 200 MG/10ML IV SOSY
PREFILLED_SYRINGE | INTRAVENOUS | Status: DC | PRN
  Administered 2022-03-17: 100 mg via INTRAVENOUS
  Administered 2022-03-17: 40 mg via INTRAVENOUS

## 2022-03-17 MED ORDER — ATORVASTATIN CALCIUM 10 MG PO TABS
10.0000 mg | ORAL_TABLET | Freq: Every day | ORAL | Status: DC
  Administered 2022-03-17 – 2022-03-20 (×4): 10 mg via ORAL
  Filled 2022-03-17 (×4): qty 1

## 2022-03-17 MED ORDER — LACTATED RINGERS IV SOLN: INTRAVENOUS | Status: DC

## 2022-03-17 MED ORDER — ACETAMINOPHEN 325 MG PO TABS
650.0000 mg | ORAL_TABLET | Freq: Four times a day (QID) | ORAL | Status: DC | PRN
  Administered 2022-03-19 (×2): 650 mg via ORAL
  Filled 2022-03-17 (×2): qty 2

## 2022-03-17 MED ORDER — KETOROLAC TROMETHAMINE 15 MG/ML IJ SOLN
15.0000 mg | Freq: Four times a day (QID) | INTRAMUSCULAR | Status: DC | PRN
  Administered 2022-03-17 – 2022-03-18 (×2): 15 mg via INTRAVENOUS
  Filled 2022-03-17 (×2): qty 1

## 2022-03-17 NOTE — Anesthesia Procedure Notes (Signed)
Procedure Name: Intubation Date/Time: 03/17/2022 1:45 AM  Performed by: Suzette Battiest, MDPre-anesthesia Checklist: Patient identified, Emergency Drugs available, Suction available and Patient being monitored Patient Re-evaluated:Patient Re-evaluated prior to induction Oxygen Delivery Method: Circle system utilized Preoxygenation: Pre-oxygenation with 100% oxygen Induction Type: IV induction Ventilation: Mask ventilation without difficulty, Two handed mask ventilation required and Oral airway inserted - appropriate to patient size Laryngoscope Size: Glidescope Grade View: Grade I Tube type: Oral Tube size: 7.0 mm Number of attempts: 4 Airway Equipment and Method: Stylet, Oral airway and Rigid stylet Placement Confirmation: ETT inserted through vocal cords under direct vision, positive ETCO2 and breath sounds checked- equal and bilateral Secured at: 21 cm Tube secured with: Tape Dental Injury: Bloody posterior oropharynx  Difficulty Due To: Difficulty was anticipated and Difficult Airway- due to anterior larynx Future Recommendations: Recommend- induction with short-acting agent, and alternative techniques readily available Comments: SIVI, RSI. DL by CRNA x 1 without adequate view. Glidescope obtained and pt mask ventilated using oral airway. Video laryngoscopy x1 with Glidescope view obtained but difficulty placing tube due to non-rigid stylet on first glidescope attempt. Mask ventilation resumed this time requiring 2 hand mask and oral airway. Glidescope laryngoscopy x 3 by myself with adequate view and rigid stylet but tube would not pass as succinylcholine worn off and cords closed. Pt mask ventilated using oral airway and two hand mask while additional propofol and succinylcholine given. Glidescope laryngoscopy for 4th attempt with adequate view and this time successful placement of ETT. +BBS, +ETCO2. Tube secured. Deatra Canter, MD

## 2022-03-17 NOTE — Transfer of Care (Signed)
Immediate Anesthesia Transfer of Care Note  Patient: Sheryl Lowe  Procedure(s) Performed: CYSTOSCOPY WITH RETROGRADE PYELOGRAM AND STENT PLACEMENT (Right: Ureter)  Patient Location: PACU  Anesthesia Type:General  Level of Consciousness: awake, alert , oriented, and patient cooperative  Airway & Oxygen Therapy: Patient Spontanous Breathing and Patient connected to face mask oxygen  Post-op Assessment: Report given to RN and Post -op Vital signs reviewed and stable  Post vital signs: Reviewed and stable  Last Vitals:  Vitals Value Taken Time  BP 136/78 03/17/22 0133  Temp 36.6 C 03/17/22 0133  Pulse 100 03/17/22 0133  Resp 22 03/17/22 0133  SpO2 97 % 03/17/22 0133    Last Pain:  Vitals:   03/17/22 0015  TempSrc:   PainSc: 0-No pain         Complications: No notable events documented.

## 2022-03-17 NOTE — Telephone Encounter (Signed)
Patient Advocate Encounter  Prior Authorization for Vancomycin HCl 125MG  capsules has been approved.    PA# [id number] Key: [id number] Effective dates: 03/17/2022 through 06/15/2022  Patients co-pay is $45.00.     Lyndel Safe, [place] Patient Advocate Specialist [place] Patient Advocate Team Direct Number: [phone]  Fax: [phone]

## 2022-03-17 NOTE — TOC Progression Note (Signed)
Transition of Care [facility]) - Progression Note    Patient Details  Name: Sheryl Lowe MRN: [id number] Date of Birth: May 24, 1960  Transition of Care [facility]) CM/SW Contact  Servando Snare, [place] Phone Number: 03/17/2022, 8:36 AM  Clinical Narrative:     Transition of Care (TOC) Screening Note   Patient Details  Name: Sheryl Lowe Date of Birth: 1960/12/26   Transition of Care [facility]) CM/SW Contact:    Servando Snare, LCSW Phone Number: 03/17/2022, 8:37 AM    Transition of Care Department [facility]) has reviewed patient and no TOC needs have been identified at this time. We will continue to monitor patient advancement through interdisciplinary progression rounds. If new patient transition needs arise, please place a TOC consult.          Expected Discharge Plan and Services                                               Social Determinants of Health (SDOH) Interventions SDOH Screenings   Food Insecurity: No Food Insecurity (03/17/2022)  Housing: Low Risk  (03/17/2022)  Transportation Needs: No Transportation Needs (03/17/2022)  Utilities: Not At Risk (03/17/2022)  Depression (PHQ2-9): Low Risk  (05/28/2019)  Tobacco Use: Low Risk  (03/16/2022)    Readmission Risk Interventions     No data to display

## 2022-03-17 NOTE — Hospital Course (Signed)
PMH of recurrent C. difficile, HLD, GERD, nephrolithiasis, HTN present to the hospital with complaints of nausea vomiting and fever and chills.  Found to have obstructive uropathy with nephrolithiasis and pyelonephritis. SP stent placement.  Currently on IV antibiotics.  Also found to have C. difficile infection currently on oral vancomycin.

## 2022-03-17 NOTE — Consult Note (Signed)
Urology Consult   Physician requesting consult: [place]  Reason for consult: Right ureteral calculus with urosepsis  History of Present Illness: Sheryl Lowe is a 62 y.o. white female who presented to the [facility] emergency room with fever and signs of UTI.  Evaluation emergency room CT scan confirmed a 3 mm right distal ureteral calculus with mild hydronephrosis.  Urine looks infected.  Patient is febrile she is tachycardic.  She presents at this time to undergo stent placement of urgent right JJ stent.  Patient states that she has had remote history of stone which required intervention and eating years ago but nothing recent.  She has had no significant flank pain or nausea vomiting.  Just having fever and urinary symptomology of urgency and burning with urination.  Also has has history of recent C. difficile colitis from antibiotic use for UTI.  She denies a history of voiding or storage urinary symptoms, hematuria, UTIs, STDs, urolithiasis, GU malignancy/trauma/surgery.  Past Medical History:  Diagnosis Date   Anxiety    Clostridium difficile infection    Hyperlipidemia    Hypertension    Kidney stones    Migraine     Past Surgical History:  Procedure Laterality Date   BIOPSY  02/04/2021   Procedure: BIOPSY;  Surgeon: Eloise Harman, DO;  Location: [facility];  Service: Endoscopy;;   CHOLECYSTECTOMY  2020   COLONOSCOPY WITH PROPOFOL N/A 02/04/2021   nonbleeding internal hemorrhoids, exam otherwise normal.  Repeat in 10 years   ESOPHAGOGASTRODUODENOSCOPY (EGD) WITH PROPOFOL N/A 02/04/2021   esophageal mucosal changes suspicious for short segment Barrett's esophagus (intestinal metaplasia), gastritis (mild nonspecific reactive gastropathy), normal duodenum, small hiatal hernia. Repeat EGD in 3 years.     Current Hospital Medications:  Home meds:  No current facility-administered medications on file prior to encounter.   Current Outpatient Medications on File Prior to  Encounter  Medication Sig Dispense Refill   atenolol (TENORMIN) 50 MG tablet Take 50 mg by mouth daily.     atorvastatin (LIPITOR) 10 MG tablet Take 1 tablet (10 mg total) by mouth daily. Need f/u visit for additional refills 30 tablet 0   omeprazole (PRILOSEC) 20 MG capsule Take 1 capsule (20 mg total) by mouth 2 (two) times daily before a meal. (Patient taking differently: Take 20 mg by mouth daily.) 60 capsule 5   ondansetron (ZOFRAN-ODT) 4 MG disintegrating tablet Take 4 mg by mouth every 8 (eight) hours.     valsartan (DIOVAN) 80 MG tablet Take 80 mg by mouth daily.       Scheduled Meds:  ketorolac  15 mg Intravenous Once   vancomycin  125 mg Oral QID   Continuous Infusions:  cefTRIAXone (ROCEPHIN)  IV     potassium chloride 10 mEq (03/16/22 2322)   PRN Meds:.  Allergies:  Allergies  Allergen Reactions   Amitriptyline     Drowsiness in the morning   Topamax [Topiramate]     Memory loss, tingling in hands and feet    Family History  Problem Relation Age of Onset   Stroke Mother    Heart disease Mother    Hypertension Father    Cancer Father    Colon cancer Neg Hx    Colon polyps Neg Hx     Social History:  reports that she has never smoked. She has never used smokeless tobacco. She reports that she does not currently use alcohol. She reports that she does not use drugs.  ROS: A complete review of  systems was performed.  All systems are negative except for pertinent findings as noted.  Physical Exam:  Vital signs in last 24 hours: Temp:  [98.2 F (36.8 C)-101.1 F (38.4 C)] 98.2 F (36.8 C) (01/11 2300) Pulse Rate:  [82-100] 82 (01/11 2300) Resp:  [18-25] 18 (01/11 2300) BP: (106-123)/(56-70) 109/57 (01/11 2300) SpO2:  [96 %-98 %] 96 % (01/11 2300) Weight:  [67.6 kg] 67.6 kg (01/11 1638) Constitutional:  Alert and oriented, No acute distress Cardiovascular: Regular rate and rhythm, No JVD Respiratory: Normal respiratory effort, Lungs clear bilaterally GI:  Abdomen is soft, nontender, nondistended, no abdominal masses GU: No CVA tenderness Lymphatic: No lymphadenopathy Neurologic: Grossly intact, no focal deficits Psychiatric: Normal mood and affect  Laboratory Data:  Recent Labs    03/16/22 1751  WBC 13.9*  HGB 11.3*  HCT 36.0  PLT 202    Recent Labs    03/16/22 1751  NA 135  K 2.7*  CL 102  GLUCOSE 105*  BUN 25*  CALCIUM 8.7*  CREATININE 1.79*     Results for orders placed or performed during the hospital encounter of 03/16/22 (from the past 24 hour(s))  Resp panel by RT-PCR (RSV, Flu A&B, Covid) Anterior Nasal Swab     Status: None   Collection Time: 03/16/22  5:47 PM   Specimen: Anterior Nasal Swab  Result Value Ref Range   SARS Coronavirus 2 by RT PCR NEGATIVE NEGATIVE   Influenza A by PCR NEGATIVE NEGATIVE   Influenza B by PCR NEGATIVE NEGATIVE   Resp Syncytial Virus by PCR NEGATIVE NEGATIVE  Lipase, blood     Status: None   Collection Time: 03/16/22  5:51 PM  Result Value Ref Range   Lipase 30 11 - 51 U/L  Comprehensive metabolic panel     Status: Abnormal   Collection Time: 03/16/22  5:51 PM  Result Value Ref Range   Sodium 135 135 - 145 mmol/L   Potassium 2.7 (LL) 3.5 - 5.1 mmol/L   Chloride 102 98 - 111 mmol/L   CO2 20 (L) 22 - 32 mmol/L   Glucose, Bld 105 (H) 70 - 99 mg/dL   BUN 25 (H) 8 - 23 mg/dL   Creatinine, Ser 1.79 (H) 0.44 - 1.00 mg/dL   Calcium 8.7 (L) 8.9 - 10.3 mg/dL   Total Protein 8.0 6.5 - 8.1 g/dL   Albumin 3.1 (L) 3.5 - 5.0 g/dL   AST 15 15 - 41 U/L   ALT 13 0 - 44 U/L   Alkaline Phosphatase 105 38 - 126 U/L   Total Bilirubin 0.4 0.3 - 1.2 mg/dL   GFR, Estimated 32 (L) >60 mL/min   Anion gap 13 5 - 15  CBC     Status: Abnormal   Collection Time: 03/16/22  5:51 PM  Result Value Ref Range   WBC 13.9 (H) 4.0 - 10.5 K/uL   RBC 3.99 3.87 - 5.11 MIL/uL   Hemoglobin 11.3 (L) 12.0 - 15.0 g/dL   HCT 36.0 36.0 - 46.0 %   MCV 90.2 80.0 - 100.0 fL   MCH 28.3 26.0 - 34.0 pg   MCHC 31.4  30.0 - 36.0 g/dL   RDW 13.6 11.5 - 15.5 %   Platelets 202 150 - 400 K/uL   nRBC 0.0 0.0 - 0.2 %  Magnesium     Status: None   Collection Time: 03/16/22  5:51 PM  Result Value Ref Range   Magnesium 1.8 1.7 - 2.4 mg/dL  Urinalysis, Routine  w reflex microscopic Urine, Clean Catch     Status: Abnormal   Collection Time: 03/16/22  7:38 PM  Result Value Ref Range   Color, Urine YELLOW YELLOW   APPearance HAZY (A) CLEAR   Specific Gravity, Urine 1.020 1.005 - 1.030   pH 5.0 5.0 - 8.0   Glucose, UA NEGATIVE NEGATIVE mg/dL   Hgb urine dipstick SMALL (A) NEGATIVE   Bilirubin Urine NEGATIVE NEGATIVE   Ketones, ur NEGATIVE NEGATIVE mg/dL   Protein, ur 100 (A) NEGATIVE mg/dL   Nitrite NEGATIVE NEGATIVE   Leukocytes,Ua MODERATE (A) NEGATIVE   RBC / HPF 21-50 0 - 5 RBC/hpf   WBC, UA >50 (H) 0 - 5 WBC/hpf   Bacteria, UA MANY (A) NONE SEEN   Squamous Epithelial / HPF 0-5 0 - 5 /HPF   WBC Clumps PRESENT    Mucus PRESENT    Recent Results (from the past 240 hour(s))  Resp panel by RT-PCR (RSV, Flu A&B, Covid) Anterior Nasal Swab     Status: None   Collection Time: 03/16/22  5:47 PM   Specimen: Anterior Nasal Swab  Result Value Ref Range Status   SARS Coronavirus 2 by RT PCR NEGATIVE NEGATIVE Final    Comment: (NOTE) SARS-CoV-2 target nucleic acids are NOT DETECTED.  The SARS-CoV-2 RNA is generally detectable in upper respiratory specimens during the acute phase of infection. The lowest concentration of SARS-CoV-2 viral copies this assay can detect is 138 copies/mL. A negative result does not preclude SARS-Cov-2 infection and should not be used as the sole basis for treatment or other patient management decisions. A negative result may occur with  improper specimen collection/handling, submission of specimen other than nasopharyngeal swab, presence of viral mutation(s) within the areas targeted by this assay, and inadequate number of viral copies(<138 copies/mL). A negative result must be  combined with clinical observations, patient history, and epidemiological information. The expected result is Negative.  Fact Sheet for Patients:  EntrepreneurPulse.com.au  Fact Sheet for Healthcare Providers:  IncredibleEmployment.be  This test is no t yet approved or cleared by the [place] FDA and  has been authorized for detection and/or diagnosis of SARS-CoV-2 by FDA under an Emergency Use Authorization (EUA). This EUA will remain  in effect (meaning this test can be used) for the duration of the COVID-19 declaration under Section 564(b)(1) of the Act, 21 U.S.C.section 360bbb-3(b)(1), unless the authorization is terminated  or revoked sooner.       Influenza A by PCR NEGATIVE NEGATIVE Final   Influenza B by PCR NEGATIVE NEGATIVE Final    Comment: (NOTE) The Xpert Xpress SARS-CoV-2/FLU/RSV plus assay is intended as an aid in the diagnosis of influenza from Nasopharyngeal swab specimens and should not be used as a sole basis for treatment. Nasal washings and aspirates are unacceptable for Xpert Xpress SARS-CoV-2/FLU/RSV testing.  Fact Sheet for Patients: EntrepreneurPulse.com.au  Fact Sheet for Healthcare Providers: IncredibleEmployment.be  This test is not yet approved or cleared by the [place] FDA and has been authorized for detection and/or diagnosis of SARS-CoV-2 by FDA under an Emergency Use Authorization (EUA). This EUA will remain in effect (meaning this test can be used) for the duration of the COVID-19 declaration under Section 564(b)(1) of the Act, 21 U.S.C. section 360bbb-3(b)(1), unless the authorization is terminated or revoked.     Resp Syncytial Virus by PCR NEGATIVE NEGATIVE Final    Comment: (NOTE) Fact Sheet for Patients: EntrepreneurPulse.com.au  Fact Sheet for Healthcare Providers: IncredibleEmployment.be  This test is  not yet  approved or cleared by the [place] and has been authorized for detection and/or diagnosis of SARS-CoV-2 by FDA under an Emergency Use Authorization (EUA). This EUA will remain in effect (meaning this test can be used) for the duration of the COVID-19 declaration under Section 564(b)(1) of the Act, 21 U.S.C. section 360bbb-3(b)(1), unless the authorization is terminated or revoked.  Performed at [facility], [address] [place], [place] [address]     Renal Function: Recent Labs    03/16/22 1751  CREATININE 1.79*   Estimated Creatinine Clearance: 28.3 mL/min (A) (by C-G formula based on SCr of 1.79 mg/dL (H)).  Radiologic Imaging: CT Renal Stone Study  Result Date: 03/16/2022 CLINICAL DATA:  Abdominal/flank pain with chills nausea, vomiting and diarrhea. History of C diff EXAM: CT ABDOMEN AND PELVIS WITHOUT CONTRAST TECHNIQUE: Multidetector CT imaging of the abdomen and pelvis was performed following the standard protocol without IV contrast. RADIATION DOSE REDUCTION: This exam was performed according to the departmental dose-optimization program which includes automated exposure control, adjustment of the mA and/or kV according to patient size and/or use of iterative reconstruction technique. COMPARISON:  12/13/2020 FINDINGS: Lower chest: No acute abnormality. Hepatobiliary: No focal liver abnormality is seen. No gallstones, gallbladder wall thickening, or biliary dilatation. Pancreas: Unremarkable. No pancreatic ductal dilatation or surrounding inflammatory changes. Spleen: Normal in size without focal abnormality. Adrenals/Urinary Tract: Unremarkable adrenal glands. 3 mm stone in the distal right ureter. Mild right hydroureteronephrosis. Unremarkable bladder. Stomach/Bowel: Colonic diverticulosis without diverticulitis. No bowel wall thickening or inflammatory change. Fatty infiltration of the wall of the sigmoid colon and rectum compatible with chronic inflammation normal  caliber large and small bowel. Normal appendix. Unremarkable stomach. Small hiatal hernia. Vascular/Lymphatic: Aortic atherosclerosis. No enlarged abdominal or pelvic lymph nodes. Reproductive: Uterus and bilateral adnexa are unremarkable. Other: No free intraperitoneal fluid or air. Musculoskeletal: No acute or significant osseous findings. IMPRESSION: 3 mm stone in the distal right ureter with mild right hydronephrosis. Colonic diverticulosis without diverticulitis. No evidence of colitis. Electronically Signed   By: Minerva Fester M.D.   On: 03/16/2022 21:16    I independently reviewed the above imaging studies.  Impression/Recommendation: 1.  Right distal ureteral calculus with associated febrile UTI versus urosepsis Plan/recommendation: As patient meets sepsis criteria we will plan for cystoscopy and placement of urgent right JJ stent.  Patient will be admitted for IV antibiotics to hospitalist service following procedure.  Patient likely need definitive management of her stone once medically stable  Belva Agee 03/17/2022, 12:21 AM     CC:

## 2022-03-17 NOTE — Plan of Care (Signed)
  Problem: Education: Goal: Knowledge of General Education information will improve Description: Including pain rating scale, medication(s)/side effects and non-pharmacologic comfort measures Outcome: Progressing   Problem: Health Behavior/Discharge Planning: Goal: Ability to manage health-related needs will improve Outcome: Progressing   Problem: Clinical Measurements: Goal: Ability to maintain clinical measurements within normal limits will improve Outcome: Progressing Goal: Will remain free from infection Outcome: Progressing Goal: Diagnostic test results will improve Outcome: Progressing Goal: Respiratory complications will improve Outcome: Progressing   Problem: Elimination: Goal: Will not experience complications related to bowel motility Outcome: Progressing Goal: Will not experience complications related to urinary retention Outcome: Progressing   Problem: Pain Managment: Goal: General experience of comfort will improve Outcome: Progressing   Problem: Safety: Goal: Ability to remain free from injury will improve Outcome: Progressing   Problem: Skin Integrity: Goal: Risk for impaired skin integrity will decrease Outcome: Progressing

## 2022-03-17 NOTE — TOC Benefit Eligibility Note (Signed)
Patient Teacher, English as a foreign language completed.    The patient is currently admitted and upon discharge could be taking Vancomycin 125 mg capsules.  Prior Authorization Required  The patient is insured through [employer] [place] Patient Advocate Specialist [place] Patient Advocate Team Direct Number: [phone]  Fax: [phone]

## 2022-03-17 NOTE — Plan of Care (Signed)

## 2022-03-17 NOTE — Progress Notes (Signed)
[facility] Progress Note Patient: Sheryl Lowe PHX:[id number] DOB: 12/02/1960 DOA: 03/16/2022  DOS: the patient was seen and examined on 03/17/2022  Brief hospital course: PMH of recurrent C. difficile, HLD, GERD, nephrolithiasis, HTN present to the hospital with complaints of nausea vomiting and fever and chills.  Found to have obstructive uropathy with nephrolithiasis and pyelonephritis. SP stent placement.  Currently on IV antibiotics.  Also found to have C. difficile infection currently on oral vancomycin. Assessment and Plan: Sepsis secondary to obstructive uropathy secondary to pyelonephritis. Nephrolithiasis. Currently on IV antibiotics. Sepsis physiology appears to be improving. Will stop IV fluids potential urology was consulted. Patient was initially at the end [facility].  Transferred to [facility] for stent placement underwent on 1/11. Outpatient follow-up with lithotripsy recommended. Would prefer a shorter course of antibiotic given her risk for C. difficile worsening.  C. difficile. On oral vancomycin. Monitor.  Acute kidney injury. Baseline serum creatinine around 0.7.  Currently worsening to 1.79. Improving with IV hydration and stent placement. Monitor.  Hypokalemia, hypomagnesemia, hypophosphatemia. In the setting of diarrhea. Aggressively treated.  GERD. Continue PPI.  Subjective: No nausea no vomiting no fever no chills.  No chest pain.  No abdominal pain.  Physical Exam: General: in Mild distress, No Rash Cardiovascular: S1 and S2 Present, No Murmur Respiratory: Good respiratory effort, Bilateral Air entry present. No Crackles, No wheezes Abdomen: Bowel Sound present, No tenderness Extremities: No edema Neuro: Alert and oriented x3, no new focal deficit  Data Reviewed: I have Reviewed nursing notes, Vitals, and Lab results. Since last encounter, pertinent lab results CBC and BMP   . I have ordered test including CBC and BMP  .    Disposition: Status is: Inpatient Remains inpatient appropriate because: Need for IV antibiotics and culture clearance  SCDs Start: 03/17/22 0238   Family Communication: No one at bedside Level of care: Med-Surg  Vitals:   03/17/22 0224 03/17/22 0619 03/17/22 1008 03/17/22 1323  BP: 121/72 129/71 124/71 123/72  Pulse: 83 68 69 78  Resp: (!) 21 19 19 18   Temp: 98.5 F (36.9 C) 97.9 F (36.6 C) 97.8 F (36.6 C) 98.3 F (36.8 C)  TempSrc: Oral Oral Oral Oral  SpO2: 95% 98% 95% 95%  Weight:      Height:         Author: Berle Mull, MD 03/17/2022 6:53 PM  Please look on [email or web address] to find out who is on call.

## 2022-03-17 NOTE — Op Note (Signed)
Preoperative diagnosis:  1.  Right distal ureteral calculus with associated fever and urosepsis  Postoperative diagnosis: 1.  Same  Procedure(s): 1.  Cystoscopy, right retrograde pyelogram with intraoperative interpretation, insertion right JJ stent  Surgeon: Dr. Harold Barban  Anesthesia: General  Complications: None  EBL: Minimal  Specimens: None  Disposition of specimens: Not applicable  Intraoperative findings: Retrograde showed a filling defect right distal ureter with mild proximal hydronephrosis.  Cloudy urine obtained after passage of the guidewire.  6 [place] by 24 cm Percuflex plus soft contour stent placed without difficulty.  Diffuse erythema consistent with cystitis noted.  Indication: 62 year old white female presented with fever and dysuria.  Found on CT scan to have 3 mm right distal ureteral calculus with mild hydro-.  Urine is infected.  Patient febrile 202.  Presents this time undergo cystoscopy insertion right JJ stent  Description of procedure:  After obtaining informed consent for the patient she was taken major cystoscopy suite placed under general anesthesia.  She was placed in the dorsolithotomy position genitalia prepped and draped in usual sterile fashion.  Proper pause and timeout was performed.  52 French cystoscope was advanced in the bladder and urine sample was obtained for culture.  Bladder was inspected showed diffuse erythematous areas consistent with cystitis.  Right ureteral orifice was identified.  5 French open tip catheter was utilized to cannulate this and gentle retrograde pyelogram performed revealed probable filling defect in the distal ureter consistent with stone seen on preoperative CT scan.  Proximal mild hydronephrosis noted.  Sensor wire was passed up to the kidney and open tip catheter removed.  There was brisk drainage of cloudy urine noted around the wire.  A 6 French by 24 cm Percuflex plus soft contour stent was placed leaving a  proximal coil in the upper pole calyx and in the distal coil in the bladder.  There was brisk flow again of cloudy urine through and around the stent noted.  Bladder was emptied procedure terminated she was awake from anesthesia and taken back to the recovery room in stable condition.  No immediate complication from the procedure.

## 2022-03-17 NOTE — Progress Notes (Signed)
1 Day Post-Op Subjective: Patient feeling better with minimal discomfort.  Having some dysuria as expected.  Febrile overnight to 101.  Continues to have loose stools consistent with C. difficile.  Urine culture is still pending.  Objective: Vital signs in last 24 hours: Temp:  [97.8 F (36.6 C)-101.1 F (38.4 C)] 98.3 F (36.8 C) (01/12 1323) Pulse Rate:  [68-100] 78 (01/12 1323) Resp:  [18-29] 18 (01/12 1323) BP: (106-136)/(56-78) 123/72 (01/12 1323) SpO2:  [95 %-100 %] 95 % (01/12 1323) Weight:  [67.6 kg] 67.6 kg (01/11 1638)  Intake/Output from previous day: 01/11 0701 - 01/12 0700 In: 2050 [I.V.:400; IV Piggyback:1650] Out: -  Intake/Output this shift: Total I/O In: 240 [P.O.:240] Out: -   Physical Exam:  General: Alert and oriented  Lab Results: Recent Labs    03/16/22 1751 03/17/22 0354  HGB 11.3* 10.2*  HCT 36.0 32.2*   BMET Recent Labs    03/16/22 1751 03/17/22 0354  NA 135 139  K 2.7* 3.6  CL 102 113*  CO2 20* 17*  GLUCOSE 105* 123*  BUN 25* 19  CREATININE 1.79* 1.39*  CALCIUM 8.7* 8.3*     Studies/Results: DG C-Arm 1-60 Min-No Report  Result Date: 03/17/2022 Fluoroscopy was utilized by the requesting physician.  No radiographic interpretation.   CT Renal Stone Study  Result Date: 03/16/2022 CLINICAL DATA:  Abdominal/flank pain with chills nausea, vomiting and diarrhea. History of C diff EXAM: CT ABDOMEN AND PELVIS WITHOUT CONTRAST TECHNIQUE: Multidetector CT imaging of the abdomen and pelvis was performed following the standard protocol without IV contrast. RADIATION DOSE REDUCTION: This exam was performed according to the departmental dose-optimization program which includes automated exposure control, adjustment of the mA and/or kV according to patient size and/or use of iterative reconstruction technique. COMPARISON:  12/13/2020 FINDINGS: Lower chest: No acute abnormality. Hepatobiliary: No focal liver abnormality is seen. No gallstones,  gallbladder wall thickening, or biliary dilatation. Pancreas: Unremarkable. No pancreatic ductal dilatation or surrounding inflammatory changes. Spleen: Normal in size without focal abnormality. Adrenals/Urinary Tract: Unremarkable adrenal glands. 3 mm stone in the distal right ureter. Mild right hydroureteronephrosis. Unremarkable bladder. Stomach/Bowel: Colonic diverticulosis without diverticulitis. No bowel wall thickening or inflammatory change. Fatty infiltration of the wall of the sigmoid colon and rectum compatible with chronic inflammation normal caliber large and small bowel. Normal appendix. Unremarkable stomach. Small hiatal hernia. Vascular/Lymphatic: Aortic atherosclerosis. No enlarged abdominal or pelvic lymph nodes. Reproductive: Uterus and bilateral adnexa are unremarkable. Other: No free intraperitoneal fluid or air. Musculoskeletal: No acute or significant osseous findings. IMPRESSION: 3 mm stone in the distal right ureter with mild right hydronephrosis. Colonic diverticulosis without diverticulitis. No evidence of colitis. Electronically Signed   By: Placido Sou M.D.   On: 03/16/2022 21:16    Assessment/Plan: 1.  Right 3 mm distal ureteral calculus with obstruction and hospital urosepsis. Plan/recommendation.  Await urine culture trend antibiotics as needed, patient will need definitive cystoscopy ureteroscopy and laser lithotripsy in about 2 weeks once medically stable.  If patient discharged home over weekend I will arrange early next week.  Dr. Louis Meckel available for [facility] as needed over the weekend.    LOS: 1 day   [name] 03/17/2022, 2:09 PM

## 2022-03-17 NOTE — Progress Notes (Signed)
Pharmacy Antibiotic Note  Sheryl Lowe is a 62 y.o. female admitted on 03/16/2022 with UTI and CDiff.  Pharmacy has been consulted for C. Diff. Management.    Plan: Continue current prescription for Vancomycin 125mg  PO QID x10 days - 1/12 Vancomycin oral prior auth completed by patient advocate team.  - Pharmacy will sign off.  Please re-consult if there are further needs.    Height: 5' (152.4 cm) Weight: 67.6 kg (149 lb) IBW/kg (Calculated) : 45.5  Temp (24hrs), Avg:98.9 F (37.2 C), Min:97.9 F (36.6 C), Max:101.1 F (38.4 C)  Recent Labs  Lab 03/16/22 1751 03/17/22 0354  WBC 13.9* 12.0*  CREATININE 1.79* 1.39*    Estimated Creatinine Clearance: 36.4 mL/min (A) (by C-G formula based on SCr of 1.39 mg/dL (H)).    Allergies  Allergen Reactions   Amitriptyline     Drowsiness in the morning   Topamax [Topiramate]     Memory loss, tingling in hands and feet     Thank you for allowing pharmacy to be a part of this patient's care.  Gretta Arab PharmD, BCPS [facility] main pharmacy [phone] 03/17/2022 9:17 AM

## 2022-03-18 DIAGNOSIS — A419 Sepsis, unspecified organism: Secondary | ICD-10-CM | POA: Diagnosis not present

## 2022-03-18 DIAGNOSIS — N390 Urinary tract infection, site not specified: Secondary | ICD-10-CM | POA: Diagnosis not present

## 2022-03-18 LAB — BASIC METABOLIC PANEL
Anion gap: 11 (ref 5–15)
BUN: 26 mg/dL — ABNORMAL HIGH (ref 8–23)
CO2: 19 mmol/L — ABNORMAL LOW (ref 22–32)
Calcium: 8.8 mg/dL — ABNORMAL LOW (ref 8.9–10.3)
Chloride: 111 mmol/L (ref 98–111)
Creatinine, Ser: 1.36 mg/dL — ABNORMAL HIGH (ref 0.44–1.00)
GFR, Estimated: 44 mL/min — ABNORMAL LOW (ref >60)
Glucose, Bld: 126 mg/dL — ABNORMAL HIGH (ref 70–99)
Potassium: 4 mmol/L (ref 3.5–5.1)
Sodium: 141 mmol/L (ref 135–145)

## 2022-03-18 LAB — MAGNESIUM: Magnesium: 2.1 mg/dL (ref 1.7–2.4)

## 2022-03-18 MED ORDER — MELATONIN 5 MG PO TABS
5.0000 mg | ORAL_TABLET | Freq: Every day | ORAL | Status: DC
  Administered 2022-03-18: 5 mg via ORAL
  Filled 2022-03-18 (×2): qty 1

## 2022-03-18 NOTE — Plan of Care (Signed)
Alert and oriented x 4. Ketorolac prn given x 1 for abdominal pain. IVF infusing. No reports of diarrhea overnight.   Problem: Education: Goal: Knowledge of General Education information will improve Description: Including pain rating scale, medication(s)/side effects and non-pharmacologic comfort measures Outcome: Progressing   Problem: Health Behavior/Discharge Planning: Goal: Ability to manage health-related needs will improve Outcome: Progressing   Problem: Clinical Measurements: Goal: Ability to maintain clinical measurements within normal limits will improve Outcome: Progressing Goal: Will remain free from infection Outcome: Progressing Goal: Diagnostic test results will improve Outcome: Progressing Goal: Respiratory complications will improve Outcome: Progressing Goal: Cardiovascular complication will be avoided Outcome: Progressing   Problem: Activity: Goal: Risk for activity intolerance will decrease Outcome: Progressing   Problem: Nutrition: Goal: Adequate nutrition will be maintained Outcome: Progressing   Problem: Coping: Goal: Level of anxiety will decrease Outcome: Progressing   Problem: Elimination: Goal: Will not experience complications related to bowel motility Outcome: Progressing Goal: Will not experience complications related to urinary retention Outcome: Progressing   Problem: Pain Managment: Goal: General experience of comfort will improve Outcome: Progressing   Problem: Safety: Goal: Ability to remain free from injury will improve Outcome: Progressing   Problem: Skin Integrity: Goal: Risk for impaired skin integrity will decrease Outcome: Progressing

## 2022-03-18 NOTE — Progress Notes (Signed)
[facility] Progress Note Patient: Sheryl Lowe [id number]:[id number] DOB: 1960/11/15 DOA: 03/16/2022  DOS: the patient was seen and examined on 03/18/2022  Brief hospital course: PMH of recurrent C. difficile, HLD, GERD, nephrolithiasis, HTN present to the hospital with complaints of nausea vomiting and fever and chills.  Found to have obstructive uropathy with nephrolithiasis and pyelonephritis. SP stent placement.  Currently on IV antibiotics.  Also found to have C. difficile infection currently on oral vancomycin. Assessment and Plan: Sepsis secondary to obstructive uropathy secondary to pyelonephritis. Nephrolithiasis. Currently on IV antibiotics.  Urine culture is growing Klebsiella pneumoniae. Sepsis physiology appears to be improving. urology was consulted. Patient was initially at the [facility]. Transferred to [facility] for stent placement underwent on 1/11. Outpatient follow-up with lithotripsy recommended. Would prefer a shorter course of antibiotic given her risk for C. difficile worsening.  C. difficile. On oral vancomycin. Monitor.  Acute kidney injury. Baseline serum creatinine around 0.7.  Currently worsening to 1.79. Improving with IV hydration and stent placement. Monitor.  Hypokalemia, hypomagnesemia, hypophosphatemia. In the setting of diarrhea. Aggressively treated.  GERD. Continue PPI.  Subjective: Abdominal pain improving.  No nausea no vomiting.  No fever no chills.  Diarrhea improving as well.  Physical Exam: General: in Mild distress, No Rash Cardiovascular: S1 and S2 Present, No Murmur Respiratory: Good respiratory effort, Bilateral Air entry present. No Crackles, No wheezes Abdomen: Bowel Sound present, No tenderness Extremities: No edema Neuro: Alert and oriented x3, no new focal deficit  Data Reviewed: I have Reviewed nursing notes, Vitals, and Lab results. Since last encounter, pertinent lab results CBC and cultures   . I  have ordered test including CBC and BMP  .    Disposition: Status is: Inpatient Remains inpatient appropriate because: Need for IV antibiotics and culture clearance  SCDs Start: 03/17/22 0238   Family Communication: No one at bedside Level of care: Med-Surg  Vitals:   03/17/22 0619 03/17/22 1008 03/17/22 1323 03/18/22 1357  BP: 129/71 124/71 123/72 135/67  Pulse: 68 69 78 (!) 53  Resp: 19 19 18 16   Temp: 97.9 F (36.6 C) 97.8 F (36.6 C) 98.3 F (36.8 C) 98.8 F (37.1 C)  TempSrc: Oral Oral Oral Oral  SpO2: 98% 95% 95% 95%  Weight:      Height:         Author: Berle Mull, MD 03/18/2022 7:21 PM  Please look on [email or web address] to find out who is on call.

## 2022-03-19 ENCOUNTER — Inpatient Hospital Stay (HOSPITAL_COMMUNITY): Payer: BC Managed Care – PPO

## 2022-03-19 DIAGNOSIS — N390 Urinary tract infection, site not specified: Secondary | ICD-10-CM | POA: Diagnosis not present

## 2022-03-19 DIAGNOSIS — A419 Sepsis, unspecified organism: Secondary | ICD-10-CM | POA: Diagnosis not present

## 2022-03-19 LAB — CBC WITH DIFFERENTIAL/PLATELET
Abs Immature Granulocytes: 0.64 10*3/uL — ABNORMAL HIGH (ref 0.00–0.07)
Basophils Absolute: 0 10*3/uL (ref 0.0–0.1)
Basophils Relative: 0 %
Eosinophils Absolute: 0 10*3/uL (ref 0.0–0.5)
Eosinophils Relative: 0 %
HCT: 29.8 % — ABNORMAL LOW (ref 36.0–46.0)
Hemoglobin: 9.4 g/dL — ABNORMAL LOW (ref 12.0–15.0)
Immature Granulocytes: 6 %
Lymphocytes Relative: 17 %
Lymphs Abs: 1.8 10*3/uL (ref 0.7–4.0)
MCH: 28.1 pg (ref 26.0–34.0)
MCHC: 31.5 g/dL (ref 30.0–36.0)
MCV: 89 fL (ref 80.0–100.0)
Monocytes Absolute: 1.1 10*3/uL — ABNORMAL HIGH (ref 0.1–1.0)
Monocytes Relative: 11 %
Neutro Abs: 7 10*3/uL (ref 1.7–7.7)
Neutrophils Relative %: 66 %
Platelets: 249 10*3/uL (ref 150–400)
RBC: 3.35 MIL/uL — ABNORMAL LOW (ref 3.87–5.11)
RDW: 13.9 % (ref 11.5–15.5)
WBC: 10.6 10*3/uL — ABNORMAL HIGH (ref 4.0–10.5)
nRBC: 0 % (ref 0.0–0.2)

## 2022-03-19 LAB — COMPREHENSIVE METABOLIC PANEL
ALT: 14 U/L (ref 0–44)
AST: 17 U/L (ref 15–41)
Albumin: 2.4 g/dL — ABNORMAL LOW (ref 3.5–5.0)
Alkaline Phosphatase: 71 U/L (ref 38–126)
Anion gap: 10 (ref 5–15)
BUN: 27 mg/dL — ABNORMAL HIGH (ref 8–23)
CO2: 20 mmol/L — ABNORMAL LOW (ref 22–32)
Calcium: 8.1 mg/dL — ABNORMAL LOW (ref 8.9–10.3)
Chloride: 110 mmol/L (ref 98–111)
Creatinine, Ser: 1.24 mg/dL — ABNORMAL HIGH (ref 0.44–1.00)
GFR, Estimated: 50 mL/min — ABNORMAL LOW (ref >60)
Glucose, Bld: 92 mg/dL (ref 70–99)
Potassium: 3.1 mmol/L — ABNORMAL LOW (ref 3.5–5.1)
Sodium: 140 mmol/L (ref 135–145)
Total Bilirubin: 0.3 mg/dL (ref 0.3–1.2)
Total Protein: 6.2 g/dL — ABNORMAL LOW (ref 6.5–8.1)

## 2022-03-19 LAB — URINE CULTURE: Culture: 100 — AB

## 2022-03-19 LAB — MAGNESIUM: Magnesium: 1.6 mg/dL — ABNORMAL LOW (ref 1.7–2.4)

## 2022-03-19 MED ORDER — FIDAXOMICIN 200 MG PO TABS
200.0000 mg | ORAL_TABLET | Freq: Two times a day (BID) | ORAL | Status: DC
  Administered 2022-03-19 – 2022-03-20 (×3): 200 mg via ORAL
  Filled 2022-03-19 (×3): qty 1

## 2022-03-19 MED ORDER — MAGNESIUM SULFATE 2 GM/50ML IV SOLN
2.0000 g | Freq: Once | INTRAVENOUS | Status: AC
  Administered 2022-03-19: 2 g via INTRAVENOUS
  Filled 2022-03-19: qty 50

## 2022-03-19 MED ORDER — CEFAZOLIN SODIUM-DEXTROSE 1-4 GM/50ML-% IV SOLN
1.0000 g | Freq: Three times a day (TID) | INTRAVENOUS | Status: DC
  Administered 2022-03-19 – 2022-03-20 (×2): 1 g via INTRAVENOUS
  Filled 2022-03-19 (×3): qty 50

## 2022-03-19 MED ORDER — POTASSIUM CHLORIDE CRYS ER 20 MEQ PO TBCR
40.0000 meq | EXTENDED_RELEASE_TABLET | ORAL | Status: AC
  Administered 2022-03-19 (×2): 40 meq via ORAL
  Filled 2022-03-19 (×2): qty 2

## 2022-03-19 NOTE — Progress Notes (Signed)
[facility] Progress Note Patient: Sheryl Lowe CVE:[id number] DOB: Jun 11, 1960 DOA: 03/16/2022  DOS: the patient was seen and examined on 03/19/2022  Brief hospital course: PMH of recurrent C. difficile, HLD, GERD, nephrolithiasis, HTN present to the hospital with complaints of nausea vomiting and fever and chills.  Found to have obstructive uropathy with nephrolithiasis and pyelonephritis. SP stent placement.  Currently on IV antibiotics.  Also found to have C. difficile infection currently on oral vancomycin. Assessment and Plan: Sepsis secondary to obstructive uropathy secondary to pyelonephritis. Nephrolithiasis. Currently on IV antibiotics.  Urine culture is growing Klebsiella pneumoniae. Sepsis physiology appears to be improving. urology was consulted. Patient was initially at the [facility]. Transferred to [facility] for stent placement underwent on 1/11. Outpatient follow-up with lithotripsy recommended. Would prefer a shorter course of antibiotic given her risk for C. difficile worsening. Urine culture is growing Klebsiella.  Switching to IV cefazolin.  C. difficile. On oral vancomycin.  Switching to fidaxomicin due to concern for resistance. Monitor.  Acute kidney injury. Baseline serum creatinine around 0.7.  Currently worsening to 1.79. Improving with IV hydration and stent placement. Monitor.  Hypokalemia, hypomagnesemia, hypophosphatemia. In the setting of diarrhea. Aggressively treated.  GERD. Continue PPI.  Fever 1/14. Patient had a fever of 102 on 1/14. X-ray abdomen shows no evidence of acute abnormality or toxic megacolon. Ultrasound abdomen negative for any hydronephrosis concerning for stent issues. No leukocytosis. No respiratory findings. Changing vancomycin to fidaxomicin.  Subjective: Pain significantly better.  No nausea no vomiting.  Had a fever this morning.  Possible erythema on the pharynx.  Physical Exam: General: in  Mild distress, No Rash Cardiovascular: S1 and S2 Present, No Murmur Respiratory: Good respiratory effort, Bilateral Air entry present. No Crackles, No wheezes Abdomen: Bowel Sound present, right CVA mild tenderness Extremities: No edema Neuro: Alert and oriented x3, no new focal deficit   Data Reviewed: I have Reviewed nursing notes, Vitals, and Lab results. Since last encounter, pertinent lab results CBC and BMP   . I have ordered test including CBC and BMP  . I have discussed pt's care plan and test results with urology  . I have ordered imaging x-ray abdomen and ultrasound renal  .     Disposition: Status is: Inpatient Remains inpatient appropriate because: Need for IV antibiotics and culture clearance  SCDs Start: 03/17/22 0238   Family Communication: No one at bedside Level of care: Med-Surg  Vitals:   03/18/22 2135 03/19/22 0710 03/19/22 0835 03/19/22 1447  BP: (!) 145/72  133/65 130/65  Pulse: (!) 54  63 (!) 57  Resp: 16  20 18   Temp: 97.9 F (36.6 C) (!) 102.8 F (39.3 C) 99.6 F (37.6 C) 98.3 F (36.8 C)  TempSrc: Oral Oral Oral Oral  SpO2: 97%  92% 98%  Weight:      Height:         Author: Berle Mull, MD 03/19/2022 8:12 PM  Please look on [email or web address] to find out who is on call.

## 2022-03-20 ENCOUNTER — Other Ambulatory Visit (HOSPITAL_COMMUNITY): Payer: Self-pay

## 2022-03-20 ENCOUNTER — Telehealth (HOSPITAL_COMMUNITY): Payer: Self-pay | Admitting: Pharmacy Technician

## 2022-03-20 DIAGNOSIS — A419 Sepsis, unspecified organism: Secondary | ICD-10-CM | POA: Diagnosis not present

## 2022-03-20 DIAGNOSIS — N390 Urinary tract infection, site not specified: Secondary | ICD-10-CM | POA: Diagnosis not present

## 2022-03-20 LAB — CBC
HCT: 33.5 % — ABNORMAL LOW (ref 36.0–46.0)
Hemoglobin: 10.6 g/dL — ABNORMAL LOW (ref 12.0–15.0)
MCH: 28.6 pg (ref 26.0–34.0)
MCHC: 31.6 g/dL (ref 30.0–36.0)
MCV: 90.5 fL (ref 80.0–100.0)
Platelets: 328 10*3/uL (ref 150–400)
RBC: 3.7 MIL/uL — ABNORMAL LOW (ref 3.87–5.11)
RDW: 13.6 % (ref 11.5–15.5)
WBC: 11.3 10*3/uL — ABNORMAL HIGH (ref 4.0–10.5)
nRBC: 0.3 % — ABNORMAL HIGH (ref 0.0–0.2)

## 2022-03-20 LAB — BASIC METABOLIC PANEL
Anion gap: 10 (ref 5–15)
BUN: 15 mg/dL (ref 8–23)
CO2: 21 mmol/L — ABNORMAL LOW (ref 22–32)
Calcium: 8.2 mg/dL — ABNORMAL LOW (ref 8.9–10.3)
Chloride: 110 mmol/L (ref 98–111)
Creatinine, Ser: 0.95 mg/dL (ref 0.44–1.00)
GFR, Estimated: >60 mL/min (ref >60)
Glucose, Bld: 89 mg/dL (ref 70–99)
Potassium: 3.8 mmol/L (ref 3.5–5.1)
Sodium: 141 mmol/L (ref 135–145)

## 2022-03-20 LAB — MAGNESIUM: Magnesium: 2 mg/dL (ref 1.7–2.4)

## 2022-03-20 MED ORDER — CEFADROXIL 500 MG PO CAPS
1.0000 g | ORAL_CAPSULE | Freq: Two times a day (BID) | ORAL | 0 refills | Status: AC
  Filled 2022-03-20: qty 20, 5d supply, fill #0

## 2022-03-20 MED ORDER — FIDAXOMICIN 200 MG PO TABS
200.0000 mg | ORAL_TABLET | Freq: Two times a day (BID) | ORAL | 0 refills | Status: DC
  Filled 2022-03-20: qty 18, 9d supply, fill #0

## 2022-03-20 NOTE — Anesthesia Postprocedure Evaluation (Signed)
Anesthesia Post Note  Patient: Barista  Procedure(s) Performed: CYSTOSCOPY WITH RETROGRADE PYELOGRAM AND STENT PLACEMENT (Right: Ureter)     Patient location during evaluation: PACU Anesthesia Type: General Level of consciousness: awake and alert Pain management: pain level controlled Vital Signs Assessment: post-procedure vital signs reviewed and stable Respiratory status: spontaneous breathing, nonlabored ventilation, respiratory function stable and patient connected to nasal cannula oxygen Cardiovascular status: blood pressure returned to baseline and stable Postop Assessment: no apparent nausea or vomiting Anesthetic complications: yes   Encounter Notable Events  Notable Event Outcome Phase Comment  Difficult to intubate - expected  <72 hours postprocedure Filed from anesthesia note documentation.    Last Vitals:  Vitals:   03/19/22 2222 03/20/22 0537  BP: 133/62 (!) 136/57  Pulse: 63 64  Resp: 17 15  Temp: 36.8 C 37.1 C  SpO2: 97% 94%    Last Pain:  Vitals:   03/20/22 0537  TempSrc: Oral  PainSc:                  Tiajuana Amass

## 2022-03-20 NOTE — TOC Benefit Eligibility Note (Signed)
Patient Teacher, English as a foreign language completed.    The patient is currently admitted and upon discharge could be taking Dificid 200 mg.  Prior Authorization Required  The patient is insured through [place], [place] Patient Advocate Specialist [place] Patient Advocate Team Direct Number: [phone]  Fax: [phone]

## 2022-03-20 NOTE — Telephone Encounter (Signed)
Patient Advocate Encounter  Prior Authorization for Dificid 200MG  tablets has been approved.    PA# [id number] Key: [id number] Effective dates: 03/20/2022 through 04/19/2022  Patients co-pay is $200.00.     Lyndel Safe, [place] Patient Advocate Specialist [place] Patient Advocate Team Direct Number: [phone]  Fax: [phone]

## 2022-03-21 ENCOUNTER — Telehealth: Payer: Self-pay | Admitting: *Deleted

## 2022-03-21 ENCOUNTER — Telehealth: Payer: Self-pay | Admitting: Internal Medicine

## 2022-03-21 NOTE — Telephone Encounter (Signed)
Patient called and asked to speak directly to [facility] or Dr. Abbey Chatters.  I offered to transfer to [facility] and she said she didn't want to talk to her, she was still waiting on a call from her from last week.  She is calling about the C-Diff and said that she would feel much better if Dr. Abbey Chatters or Vicente Males would lay their eyes on her chart.  She said that medication has been changed and she just wants to speak to either one of them.

## 2022-03-21 NOTE — Telephone Encounter (Signed)
Pt called and states that she was released from the hospital yesterday. She states she had a gallstone and recurring C. Difficile. A stent was placed for the gallstone. She was giving antibiotics for C. Difficile. She states vancomycin did not work and was giving fidaxomicin. She states she asked the hospitalist  to page  Dr.Caver and was told there was no need. She informed me that she only wants to speak to Dr. Abbey Chatters or Roseanne Kaufman, NP. As they were who she was seeing before she seen Venetia Night, NP. She would like one of them to look at her hospital stay to make sure everything is fine. Please advise.

## 2022-03-21 NOTE — Telephone Encounter (Signed)
See above note

## 2022-03-22 ENCOUNTER — Encounter: Payer: Self-pay | Admitting: Internal Medicine

## 2022-03-22 ENCOUNTER — Encounter: Payer: Self-pay | Admitting: Gastroenterology

## 2022-03-22 ENCOUNTER — Telehealth: Payer: Self-pay | Admitting: Internal Medicine

## 2022-03-22 NOTE — Telephone Encounter (Signed)
Pt called this morning regarding "why I never returned her call. I advised her that [facility] was the last Dr to see her and per office policy I had to transfer the call to that nurse which was Webb Silversmith. She proceeded to say well that was not her choice trying to over talk me. Once again I advised of the policy and she said what she was not going to do was see or talk to who we wanted and for that reason she wants her records transferred. I advised the pt that I didn't handle that but I could transfer her to the front staff who does. Call was transferred.

## 2022-03-22 NOTE — Telephone Encounter (Signed)
Good Morning Dr Lorenso Courier   We have received a request from patient wanting to do a transfer of care to you from [facility] gastro.  Patient states she is not satisfied with current provider. Records are available for review in epic. Please review and advise on scheduling.  Thank you

## 2022-03-22 NOTE — Telephone Encounter (Signed)
I saw this message yesterday and reviewed her chart. I had planned on calling today (within 24 hours) and actually called this morning then saw this correspondence from patient again this morning.  She had initially said she wants to transfer her care, but she will keep it here now.   I have an opening on 1/24 at 0930. Mandy, please put patient in that slot. I informed her that it was the [employer] location as well.   Patient relayed she was just very concerned and had trusted [place] with her care; she was under the impression we were calling her back right away. I have spoken to her about using MyChart messages as well; she will message me on there if needed.

## 2022-03-22 NOTE — Telephone Encounter (Signed)
Noted  

## 2022-03-22 NOTE — Telephone Encounter (Signed)
noted 

## 2022-03-25 NOTE — Discharge Summary (Signed)
Physician Discharge Summary   Patient: Sheryl Lowe MRN: [id number] DOB: [birth year]  Admit date:     03/16/2022  Discharge date: 03/20/2022  Discharge Physician: Lynden Oxford  PCP: Pollyann Savoy, NP  Recommendations at discharge: Follow-up with PCP.  Follow-up with urology.   Follow-up Information     Chapell, Waldron Session, NP. Schedule an appointment as soon as possible for a visit in 1 week(s).   Specialty: Family Medicine Contact information: [address] [place] [address] [phone]         Belva Agee, MD. Schedule an appointment as soon as possible for a visit in 1 week(s).   Specialty: Urology Contact information: [address] [place] [address] [phone]                Discharge Diagnoses: Principal Problem:   Sepsis secondary to UTI [facility]) Active Problems:   Mixed hyperlipidemia   Essential hypertension, benign   GERD (gastroesophageal reflux disease)   Prediabetes   C. difficile diarrhea   Acute pyelonephritis   Nephrolithiasis   Hypokalemia   AKI (acute kidney injury) (HCC)   Hypomagnesemia   Hypophosphatemia   Hypoalbuminemia due to protein-calorie malnutrition [facility])  Hospital Course: PMH of recurrent C. difficile, HLD, GERD, nephrolithiasis, HTN present to the hospital with complaints of nausea vomiting and fever and chills.  Found to have obstructive uropathy with nephrolithiasis and pyelonephritis. SP stent placement.  Currently on IV antibiotics.  Also found to have C. difficile infection was on oral vancomycin, now switched to fidaxomicin.  Assessment and Plan  Sepsis secondary to obstructive uropathy secondary to pyelonephritis. Nephrolithiasis. Initially was on IV antibiotics.  Urine culture growing capsular pneumonia. Will switch to oral antibiotics. Sepsis physiology appears to be improving. urology was consulted. Patient was initially at the [facility]. Transferred to [facility] for  stent placement underwent on 1/11. Outpatient follow-up with lithotripsy recommended. Would prefer a shorter course of antibiotic given her risk for C. difficile worsening.   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  Switching to fidaxomicin due to concern for resistance and fever Monitor.   Acute kidney injury. Baseline serum creatinine around 0.7.  Currently worsening to 1.79. Improving with IV hydration and stent placement. Monitor.   Hypokalemia, hypomagnesemia, hypophosphatemia. In the setting of diarrhea. Aggressively treated.   GERD. Continue PPI.   Fever 1/14. Patient had a fever of 102 on 1/14. X-ray abdomen shows no evidence of acute abnormality or toxic megacolon. Ultrasound abdomen negative for any hydronephrosis concerning for stent issues. No leukocytosis. No respiratory findings. Changing vancomycin to fidaxomicin.  No fever after 24 hours.  Discharged home with close follow-up on discharge.  Consultants:  Urology  Procedures performed:  Cystoscopy with stent placement of the left ureter  DISCHARGE MEDICATION: Allergies as of 03/20/2022       Reactions   Amitriptyline    Drowsiness in the morning   Topamax [topiramate]    Memory loss, tingling in hands and feet        Medication List     STOP taking these medications    omeprazole 20 MG capsule Commonly known as: PRILOSEC   valsartan 80 MG tablet Commonly known as: DIOVAN       TAKE these medications    atenolol 50 MG tablet Commonly known as: TENORMIN Take 50 mg by mouth daily.   atorvastatin 10 MG tablet Commonly known as: LIPITOR Take 1 tablet (10 mg total) by mouth daily. Need  f/u visit for additional refills   cefadroxil 500 MG capsule Commonly known as: DURICEF Take 2 capsules (1,000 mg total) by mouth 2 (two) times daily for 5 days.   Dificid 200 MG Tabs tablet Generic drug: fidaxomicin Take 1 tablet (200 mg total) by mouth 2 (two) times daily for 9 days.    ondansetron 4 MG disintegrating tablet Commonly known as: ZOFRAN-ODT Take 4 mg by mouth every 8 (eight) hours.       Disposition: Home Diet recommendation: Cardiac diet  Discharge Exam: Vitals:   03/19/22 0835 03/19/22 1447 03/19/22 2222 03/20/22 0537  BP: 133/65 130/65 133/62 (!) 136/57  Pulse: 63 (!) 57 63 64  Resp: 20 18 17 15   Temp: 99.6 F (37.6 C) 98.3 F (36.8 C) 98.3 F (36.8 C) 98.8 F (37.1 C)  TempSrc: Oral Oral Oral Oral  SpO2: 92% 98% 97% 94%  Weight:      Height:       General: Appear in no distress; no visible Abnormal Neck Mass Or lumps, Conjunctiva normal Cardiovascular: S1 and S2 Present, no Murmur, Respiratory: good respiratory effort, Bilateral Air entry present and CTA, no Crackles, no wheezes Abdomen: Bowel Sound present, Non tender  Extremities: no Pedal edema Neurology: alert and oriented to time, place, and person  Filed Weights   03/16/22 1638  Weight: 67.6 kg   Condition at discharge: stable  The results of significant diagnostics from this hospitalization (including imaging, microbiology, ancillary and laboratory) are listed below for reference.   Imaging Studies: DG Abd Portable 1V  Result Date: 03/19/2022 CLINICAL DATA:  217897 C. difficile diarrhea [id number] EXAM: PORTABLE ABDOMEN - 1 VIEW COMPARISON:  March 16, 2022 FINDINGS: RIGHT-sided nephroureteral stent. No dilated loops of bowel are seen. Status post cholecystectomy. LEFT basilar atelectasis. IMPRESSION: Nonobstructive bowel gas pattern. Electronically Signed   By: Valentino Saxon M.D.   On: 03/19/2022 15:36   US RENAL  Result Date: 03/19/2022 CLINICAL DATA:  Nephrolithiasis. EXAM: RENAL / URINARY TRACT ULTRASOUND COMPLETE COMPARISON:  None Available. FINDINGS: Right Kidney: Renal measurements: 9.4 x 5.3 x 4.1 cm = volume: 106 mL. Contains a 10 mm cyst. No follow-up imaging recommended for the cyst. Left Kidney: Renal measurements: 10.4 x 4.6 x 4.2 cm = volume: 1 0 mL.  Echogenicity within normal limits. No mass or hydronephrosis visualized. Bladder: Appears normal for degree of bladder distention. Other: None. IMPRESSION: No significant abnormalities. Electronically Signed   By: Dorise Bullion III M.D.   On: 03/19/2022 14:39   DG C-Arm 1-60 Min-No Report  Result Date: 03/17/2022 Fluoroscopy was utilized by the requesting physician.  No radiographic interpretation.   CT Renal Stone Study  Result Date: 03/16/2022 CLINICAL DATA:  Abdominal/flank pain with chills nausea, vomiting and diarrhea. History of C diff EXAM: CT ABDOMEN AND PELVIS WITHOUT CONTRAST TECHNIQUE: Multidetector CT imaging of the abdomen and pelvis was performed following the standard protocol without IV contrast. RADIATION DOSE REDUCTION: This exam was performed according to the departmental dose-optimization program which includes automated exposure control, adjustment of the mA and/or kV according to patient size and/or use of iterative reconstruction technique. COMPARISON:  12/13/2020 FINDINGS: Lower chest: No acute abnormality. Hepatobiliary: No focal liver abnormality is seen. No gallstones, gallbladder wall thickening, or biliary dilatation. Pancreas: Unremarkable. No pancreatic ductal dilatation or surrounding inflammatory changes. Spleen: Normal in size without focal abnormality. Adrenals/Urinary Tract: Unremarkable adrenal glands. 3 mm stone in the distal right ureter. Mild right hydroureteronephrosis. Unremarkable bladder. Stomach/Bowel: Colonic diverticulosis without diverticulitis. No  bowel wall thickening or inflammatory change. Fatty infiltration of the wall of the sigmoid colon and rectum compatible with chronic inflammation normal caliber large and small bowel. Normal appendix. Unremarkable stomach. Small hiatal hernia. Vascular/Lymphatic: Aortic atherosclerosis. No enlarged abdominal or pelvic lymph nodes. Reproductive: Uterus and bilateral adnexa are unremarkable. Other: No free  intraperitoneal fluid or air. Musculoskeletal: No acute or significant osseous findings. IMPRESSION: 3 mm stone in the distal right ureter with mild right hydronephrosis. Colonic diverticulosis without diverticulitis. No evidence of colitis. Electronically Signed   By: Placido Sou M.D.   On: 03/16/2022 21:16    Microbiology: Results for orders placed or performed during the hospital encounter of 03/16/22  Resp panel by RT-PCR (RSV, Flu A&B, Covid) Anterior Nasal Swab     Status: None   Collection Time: 03/16/22  5:47 PM   Specimen: Anterior Nasal Swab  Result Value Ref Range Status   SARS Coronavirus 2 by RT PCR NEGATIVE NEGATIVE Final    Comment: (NOTE) SARS-CoV-2 target nucleic acids are NOT DETECTED.  The SARS-CoV-2 RNA is generally detectable in upper respiratory specimens during the acute phase of infection. The lowest concentration of SARS-CoV-2 viral copies this assay can detect is 138 copies/mL. A negative result does not preclude SARS-Cov-2 infection and should not be used as the sole basis for treatment or other patient management decisions. A negative result may occur with  improper specimen collection/handling, submission of specimen other than nasopharyngeal swab, presence of viral mutation(s) within the areas targeted by this assay, and inadequate number of viral copies(<138 copies/mL). A negative result must be combined with clinical observations, patient history, and epidemiological information. The expected result is Negative.  Fact Sheet for Patients:  EntrepreneurPulse.com.au  Fact Sheet for Healthcare Providers:  IncredibleEmployment.be  This test is no t yet approved or cleared by the [place] FDA and  has been authorized for detection and/or diagnosis of SARS-CoV-2 by FDA under an Emergency Use Authorization (EUA). This EUA will remain  in effect (meaning this test can be used) for the duration of the COVID-19  declaration under Section 564(b)(1) of the Act, 21 U.S.C.section 360bbb-3(b)(1), unless the authorization is terminated  or revoked sooner.       Influenza A by PCR NEGATIVE NEGATIVE Final   Influenza B by PCR NEGATIVE NEGATIVE Final    Comment: (NOTE) The Xpert Xpress SARS-CoV-2/FLU/RSV plus assay is intended as an aid in the diagnosis of influenza from Nasopharyngeal swab specimens and should not be used as a sole basis for treatment. Nasal washings and aspirates are unacceptable for Xpert Xpress SARS-CoV-2/FLU/RSV testing.  Fact Sheet for Patients: EntrepreneurPulse.com.au  Fact Sheet for Healthcare Providers: IncredibleEmployment.be  This test is not yet approved or cleared by the [place] FDA and has been authorized for detection and/or diagnosis of SARS-CoV-2 by FDA under an Emergency Use Authorization (EUA). This EUA will remain in effect (meaning this test can be used) for the duration of the COVID-19 declaration under Section 564(b)(1) of the Act, 21 U.S.C. section 360bbb-3(b)(1), unless the authorization is terminated or revoked.     Resp Syncytial Virus by PCR NEGATIVE NEGATIVE Final    Comment: (NOTE) Fact Sheet for Patients: EntrepreneurPulse.com.au  Fact Sheet for Healthcare Providers: IncredibleEmployment.be  This test is not yet approved or cleared by the [place] FDA and has been authorized for detection and/or diagnosis of SARS-CoV-2 by FDA under an Emergency Use Authorization (EUA). This EUA will remain in effect (meaning this test can be used) for the  duration of the COVID-19 declaration under Section 564(b)(1) of the Act, 21 U.S.C. section 360bbb-3(b)(1), unless the authorization is terminated or revoked.  Performed at [facility], [address], [place], [place] [address]   Urine Culture     Status: Abnormal   Collection Time: 03/17/22  1:07 AM   Specimen: Urine,  Cystoscope  Result Value Ref Range Status   Specimen Description   Final    CYSTOSCOPY URINE Performed at [facility], [address], [place], [place] [address]    Special Requests   Final    NONE Performed at [facility], [address], [place], [place] [address]    Culture 100 COLONIES/mL KLEBSIELLA PNEUMONIAE (A)  Final   Report Status 03/19/2022 FINAL  Final   Organism ID, Bacteria KLEBSIELLA PNEUMONIAE (A)  Final      Susceptibility   Klebsiella pneumoniae - MIC*    AMPICILLIN >=32 RESISTANT Resistant     CEFAZOLIN <=4 SENSITIVE Sensitive     CEFEPIME <=0.12 SENSITIVE Sensitive     CEFTAZIDIME <=1 SENSITIVE Sensitive     CEFTRIAXONE <=0.25 SENSITIVE Sensitive     CIPROFLOXACIN <=0.25 SENSITIVE Sensitive     GENTAMICIN <=1 SENSITIVE Sensitive     IMIPENEM <=0.25 SENSITIVE Sensitive     TRIMETH/SULFA <=20 SENSITIVE Sensitive     AMPICILLIN/SULBACTAM 8 SENSITIVE Sensitive     PIP/TAZO <=4 SENSITIVE Sensitive     * 100 COLONIES/mL KLEBSIELLA PNEUMONIAE  C Difficile Quick Screen w PCR reflex     Status: Abnormal   Collection Time: 03/17/22  2:28 AM   Specimen: STOOL  Result Value Ref Range Status   C Diff antigen POSITIVE (A) NEGATIVE Final   C Diff toxin NEGATIVE NEGATIVE Final   C Diff interpretation Results are indeterminate. See PCR results.  Final    Comment: Performed at [facility], [address], [place], [place] [address]  C. Diff by PCR, Reflexed     Status: Abnormal   Collection Time: 03/17/22  2:28 AM  Result Value Ref Range Status   Toxigenic C. Difficile by PCR POSITIVE (A) NEGATIVE Final    Comment: Positive for toxigenic C. difficile with little to no toxin production. Only treat if clinical presentation suggests symptomatic illness. Performed at [facility], [address] [place], [place] [address]    Labs: CBC: Recent Labs  Lab 03/19/22 0802 03/20/22 1056  WBC 10.6* 11.3*   NEUTROABS 7.0  --   HGB 9.4* 10.6*  HCT 29.8* 33.5*  MCV 89.0 90.5  PLT 249 328   Basic Metabolic Panel: Recent Labs  Lab 03/19/22 0802 03/20/22 1056  NA 140 141  K 3.1* 3.8  CL 110 110  CO2 20* 21*  GLUCOSE 92 89  BUN 27* 15  CREATININE 1.24* 0.95  CALCIUM 8.1* 8.2*  MG 1.6* 2.0   Liver Function Tests: Recent Labs  Lab 03/19/22 0802  AST 17  ALT 14  ALKPHOS 71  BILITOT 0.3  PROT 6.2*  ALBUMIN 2.4*   CBG: No results for input(s): "GLUCAP" in the last 168 hours.  Discharge time spent: greater than 30 minutes.  Signed: Lynden Oxford, MD [facility] 03/20/2022

## 2022-03-27 ENCOUNTER — Other Ambulatory Visit: Payer: Self-pay | Admitting: Urology

## 2022-03-27 NOTE — Progress Notes (Signed)
Anesthesia Review:  PCP: Cardiologist : Chest x-ray : EKG : 03/17/22  Echo : Stress test: Cardiac Cath :  Activity level:  Sleep Study/ CPAP : Fasting Blood Sugar :      / Checks Blood Sugar -- times a day:   Blood Thinner/ Instructions /Last Dose: ASA / Instructions/ Last Dose :    03/20/22- cbc, magnesium and bmp- white counte elevated and hgb decreased  03/17/22- hgba1c- 6.1

## 2022-03-27 NOTE — Patient Instructions (Addendum)
SURGICAL WAITING ROOM VISITATION  Patients having surgery or a procedure may have no more than 2 support people in the waiting area - these visitors may rotate.    Children under the age of 35 must have an adult with them who is not the patient.  Due to an increase in RSV and influenza rates and associated hospitalizations, children ages 72 and under may not visit patients in [place].  If the patient needs to stay at the hospital during part of their recovery, the visitor guidelines for inpatient rooms apply. Pre-op nurse will coordinate an appropriate time for 1 support person to accompany patient in pre-op.  This support person may not rotate.    Please refer to the [facility] website for the visitor guidelines for Inpatients (after your surgery is over and you are in a regular room).       Your procedure is scheduled on:  04/04/2022    Report to [facility] Main Entrance    Report to admitting at   0930AM   Call this number if you have problems the morning of surgery [phone]   Do not eat food or dirnk liquids  :After Midnight.                    If you have questions, please contact your surgeon's office.     Oral Hygiene is also important to reduce your risk of infection.                                    Remember - BRUSH YOUR TEETH THE MORNING OF SURGERY WITH YOUR REGULAR TOOTHPASTE  DENTURES WILL BE REMOVED PRIOR TO SURGERY PLEASE DO NOT APPLY "Poly grip" OR ADHESIVES!!!   Do NOT smoke after Midnight   Take these medicines the morning of surgery with A SIP OF WATER:   Atenolol   DO NOT TAKE ANY ORAL DIABETIC MEDICATIONS DAY OF YOUR SURGERY  Bring CPAP mask and tubing day of surgery.                              You may not have any metal on your body including hair pins, jewelry, and body piercing             Do not wear make-up, lotions, powders, perfumes/cologne, or deodorant  Do not wear nail polish including gel and S&S,  artificial/acrylic nails, or any other type of covering on natural nails including finger and toenails. If you have artificial nails, gel coating, etc. that needs to be removed by a nail salon please have this removed prior to surgery or surgery may need to be canceled/ delayed if the surgeon/ anesthesia feels like they are unable to be safely monitored.   Do not shave  48 hours prior to surgery.               Men may shave face and neck.   Do not bring valuables to the hospital. [place].   Contacts, glasses, dentures or bridgework may not be worn into surgery.   Bring small overnight bag day of surgery.   DO NOT [place]. PHARMACY WILL DISPENSE MEDICATIONS LISTED ON YOUR MEDICATION LIST TO YOU  DURING YOUR ADMISSION IN [place]!    Patients discharged on the day of surgery will not be allowed to drive home.  Someone NEEDS to stay with you for the first 24 hours after anesthesia.   Special Instructions: Bring a copy of your healthcare power of attorney and living will documents the day of surgery if you haven't scanned them before.              Please read over the following fact sheets you were given: IF [place] [phone]   If you received a COVID test during your pre-op visit  it is requested that you wear a mask when out in public, stay away from anyone that may not be feeling well and notify your surgeon if you develop symptoms. If you test positive for Covid or have been in contact with anyone that has tested positive in the last 10 days please notify you surgeon.    [place] - Preparing for Surgery Before surgery, you can play an important role.  Because skin is not sterile, your skin needs to be as free of germs as possible.  You can reduce the number of germs on your skin by washing with CHG (chlorahexidine gluconate) soap before  surgery.  CHG is an antiseptic cleaner which kills germs and bonds with the skin to continue killing germs even after washing. Please DO NOT use if you have an allergy to CHG or antibacterial soaps.  If your skin becomes reddened/irritated stop using the CHG and inform your nurse when you arrive at Short Stay. Do not shave (including legs and underarms) for at least 48 hours prior to the first CHG shower.  You may shave your face/neck. Please follow these instructions carefully:  1.  Shower with CHG Soap the night before surgery and the  morning of Surgery.  2.  If you choose to wash your hair, wash your hair first as usual with your  normal  shampoo.  3.  After you shampoo, rinse your hair and body thoroughly to remove the  shampoo.                           4.  Use CHG as you would any other liquid soap.  You can apply chg directly  to the skin and wash                       Gently with a scrungie or clean washcloth.  5.  Apply the CHG Soap to your body ONLY FROM THE NECK DOWN.   Do not use on face/ open                           Wound or open sores. Avoid contact with eyes, ears mouth and genitals (private parts).                       Wash face,  Genitals (private parts) with your normal soap.             6.  Wash thoroughly, paying special attention to the area where your surgery  will be performed.  7.  Thoroughly rinse your body with warm water from the neck down.  8.  DO NOT shower/wash with your normal soap after using and rinsing off  the CHG Soap.  9.  Pat yourself dry with a clean towel.            10.  Wear clean pajamas.            11.  Place clean sheets on your bed the night of your first shower and do not  sleep with pets. Day of Surgery : Do not apply any lotions/deodorants the morning of surgery.  Please wear clean clothes to the hospital/surgery center.  FAILURE TO FOLLOW THESE INSTRUCTIONS MAY RESULT IN THE CANCELLATION OF YOUR SURGERY PATIENT  SIGNATURE_________________________________  NURSE SIGNATURE__________________________________  ________________________________________________________________________

## 2022-03-29 ENCOUNTER — Encounter: Payer: Self-pay | Admitting: Gastroenterology

## 2022-03-29 ENCOUNTER — Ambulatory Visit (INDEPENDENT_AMBULATORY_CARE_PROVIDER_SITE_OTHER): Payer: BC Managed Care – PPO | Admitting: Gastroenterology

## 2022-03-29 VITALS — BP 138/86 | HR 71 | Temp 98.1°F | Ht 60.0 in | Wt 150.2 lb

## 2022-03-29 DIAGNOSIS — A0472 Enterocolitis due to Clostridium difficile, not specified as recurrent: Secondary | ICD-10-CM

## 2022-03-29 MED ORDER — DIFICID 200 MG PO TABS: 200.0000 mg | ORAL_TABLET | Freq: Two times a day (BID) | ORAL | 0 refills | Status: AC

## 2022-03-29 NOTE — Progress Notes (Signed)
Gastroenterology Office Note     Primary Care Physician:  Pollyann Savoy, NP  Primary Gastroenterologist: Dr. Marletta Lor    Chief Complaint   Chief Complaint  Patient presents with   Hospitalization Follow-up    Hospitalization follow up. Has concerns about c diff. Reports she thinks c diff is cleared up. Still has some diarrhea. Finished dificid 200mg  one bid this morning. Taking cefadroxil 500mg  one daily.      History of Present Illness   Sheryl Lowe is a 62 y.o. 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   Diarrhea on admission. UTI in November, took course of abx and was given prophylactic vanc with this. Stool is forming up now. Feels improved. Stent remains in urethra. Upcoming appt for surgery. Omeprazole once daily. Remains on abx through 1/30.      Past Medical History:  Diagnosis Date   Anxiety    Clostridium difficile infection    Hyperlipidemia    Hypertension    Kidney stones    Migraine     Past Surgical History:  Procedure Laterality Date   BIOPSY  02/04/2021   Procedure: BIOPSY;  Surgeon: 2/30, DO;  Location: [facility];  Service: Endoscopy;;   CHOLECYSTECTOMY  2020   COLONOSCOPY WITH PROPOFOL N/A 02/04/2021   nonbleeding internal hemorrhoids, exam otherwise normal.  Repeat in 10 years   CYSTOSCOPY WITH RETROGRADE PYELOGRAM, URETEROSCOPY AND STENT PLACEMENT Right 03/16/2022   Procedure: CYSTOSCOPY WITH RETROGRADE PYELOGRAM AND STENT PLACEMENT;  Surgeon: 14/04/2020, MD;  Location: [facility];  Service: Urology;  Laterality: Right;   ESOPHAGOGASTRODUODENOSCOPY (EGD) WITH PROPOFOL N/A 02/04/2021    esophageal mucosal changes suspicious for short segment Barrett's esophagus (intestinal metaplasia), gastritis (mild nonspecific reactive gastropathy), normal duodenum, small hiatal hernia. Repeat EGD in 3 years.    Current Outpatient Medications  Medication Sig Dispense Refill   atenolol (TENORMIN) 50 MG tablet Take 50 mg by mouth daily.     atorvastatin (LIPITOR) 10 MG tablet Take 1 tablet (10 mg total) by mouth daily. Need f/u visit for additional refills 30 tablet 0   cefadroxil (DURICEF) 500 MG capsule Take 500 mg by mouth daily.     ondansetron (ZOFRAN-ODT) 4 MG disintegrating tablet Take 4 mg by mouth every 8 (eight) hours.     Probiotic Product (SUPERIOR PROBIOTIC) CAPS Take 1 capsule by mouth daily.     fidaxomicin (DIFICID) 200 MG TABS tablet Take 1 tablet (200 mg total) by mouth 2 (two) times daily for 9 days. (Patient not taking: Reported on 03/29/2022) 18 tablet 0   No current facility-administered medications for this visit.    Allergies as of 03/29/2022 - Review Complete 03/29/2022  Allergen Reaction Noted   Amitriptyline  10/18/2015   Topamax [topiramate]  10/18/2015    Family History  Problem Relation Age of Onset   Stroke Mother    Heart disease Mother    Hypertension Father    Cancer Father    Colon cancer Neg Hx    Colon polyps Neg Hx     Social History   Socioeconomic History   Marital status: Married    Spouse name: Not on  file   Number of children: Not on file   Years of education: Not on file   Highest education level: Not on file  Occupational History   Not on file  Tobacco Use   Smoking status: Never    Passive exposure: Never   Smokeless tobacco: Never  Vaping Use   Vaping Use: Never used  Substance and Sexual Activity   Alcohol use: Not Currently   Drug use: No   Sexual activity: Not on file  Other Topics Concern   Not on file  Social History Narrative   Not on file   Social Determinants of Health   Financial Resource Strain: Not on  file  Food Insecurity: No Food Insecurity (03/17/2022)   Hunger Vital Sign    Worried About Running Out of Food in the Last Year: Never true    Ran Out of Food in the Last Year: Never true  Transportation Needs: No Transportation Needs (03/17/2022)   PRAPARE - Hydrologist (Medical): No    Lack of Transportation (Non-Medical): No  Physical Activity: Not on file  Stress: Not on file  Social Connections: Not on file  Intimate Partner Violence: Not At Risk (03/17/2022)   Humiliation, Afraid, Rape, and Kick questionnaire    Fear of Current or Ex-Partner: No    Emotionally Abused: No    Physically Abused: No    Sexually Abused: No     Review of Systems   Gen: Denies any fever, chills, fatigue, weight loss, lack of appetite.  CV: Denies chest pain, heart palpitations, peripheral edema, syncope.  Resp: Denies shortness of breath at rest or with exertion. Denies wheezing or cough.  GI: Denies dysphagia or odynophagia. Denies jaundice, hematemesis, fecal incontinence. GU : Denies urinary burning, urinary frequency, urinary hesitancy MS: Denies joint pain, muscle weakness, cramps, or limitation of movement.  Derm: Denies rash, itching, dry skin Psych: Denies depression, anxiety, memory loss, and confusion Heme: Denies bruising, bleeding, and enlarged lymph nodes.   Physical Exam   BP 138/86 (BP Location: Left Arm, Patient Position: Sitting, Cuff Size: Large)   Pulse 71   Temp 98.1 F (36.7 C) (Oral)   Ht 5' (1.524 m)   Wt 150 lb 3.2 oz (68.1 kg)   BMI 29.33 kg/m  General:   Alert and oriented. Pleasant and cooperative. Well-nourished and well-developed.  Head:  Normocephalic and atraumatic. Eyes:  Without icterus Abdomen:  +BS, soft, non-tender and non-distended. No HSM noted. No guarding or rebound. No masses appreciated.  Rectal:  Deferred  Msk:  Symmetrical without gross deformities. Normal posture. Extremities:  Without edema. Neurologic:  Alert  and  oriented x4;  grossly normal neurologically. Skin:  Intact without significant lesions or rashes. Psych:  Alert and cooperative. Normal mood and affect.   Assessment   Sheryl Lowe is a 62 y.o. 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 As she has to remain on abx for upcoming cystoscopy and stent exchange, I would favor continuing this BID through Monday, when she has stent exchange. If insurance does not cover this, may take vanc 125 mg once daily as prophylactic measures due to high risk of refractory disease/recurrence on concomitant abx.  PLAN    Continue fidaxomicin BID until Monday. If insurance does not cover, then start vanc 125 mg orally daily until stent exchange next week Return in 6 months Call with concerns   Annitta Needs, PhD, [facility] Gastroenterology

## 2022-03-29 NOTE — Patient Instructions (Signed)
I would like for you to continue Dificid twice a day until Monday. If insurance does not cover this, you can take vancomycin 125 milligrams ONE capsule each day through Monday.   We will see you in 6 months!  Please message with any concerns!  I enjoyed seeing you again today! At our first visit, I mentioned how I value our relationship and want to provide genuine, compassionate, and quality care. You may receive a survey regarding your visit with me, and I welcome your feedback! Thanks so much for taking the time to complete this. I look forward to seeing you again.   Annitta Needs, PhD, ANP-BC [facility] Gastroenterology

## 2022-03-30 ENCOUNTER — Encounter (HOSPITAL_COMMUNITY): Payer: Self-pay

## 2022-03-30 ENCOUNTER — Encounter (HOSPITAL_COMMUNITY)
Admission: RE | Admit: 2022-03-30 | Discharge: 2022-03-30 | Disposition: A | Discharge status: Home / Self Care | Payer: BC Managed Care – PPO | Source: Ambulatory Visit | Attending: Urology | Admitting: Urology

## 2022-03-30 ENCOUNTER — Other Ambulatory Visit: Payer: Self-pay

## 2022-03-30 VITALS — BP 145/95 | HR 72 | Temp 98.4°F | Resp 16 | Ht 60.0 in | Wt 147.0 lb

## 2022-03-30 DIAGNOSIS — Z01812 Encounter for preprocedural laboratory examination: Secondary | ICD-10-CM | POA: Diagnosis present

## 2022-03-30 DIAGNOSIS — Z01818 Encounter for other preprocedural examination: Secondary | ICD-10-CM

## 2022-03-30 HISTORY — DX: Other complications of anesthesia, initial encounter: T88.59XA

## 2022-03-30 HISTORY — DX: Personal history of urinary calculi: Z87.442

## 2022-03-30 LAB — CBC
HCT: 39.9 % (ref 36.0–46.0)
Hemoglobin: 12.4 g/dL (ref 12.0–15.0)
MCH: 28.4 pg (ref 26.0–34.0)
MCHC: 31.1 g/dL (ref 30.0–36.0)
MCV: 91.3 fL (ref 80.0–100.0)
Platelets: 368 10*3/uL (ref 150–400)
RBC: 4.37 MIL/uL (ref 3.87–5.11)
RDW: 13.4 % (ref 11.5–15.5)
WBC: 7.6 10*3/uL (ref 4.0–10.5)
nRBC: 0 % (ref 0.0–0.2)

## 2022-03-30 LAB — BASIC METABOLIC PANEL
Anion gap: 8 (ref 5–15)
BUN: 19 mg/dL (ref 8–23)
CO2: 24 mmol/L (ref 22–32)
Calcium: 9.2 mg/dL (ref 8.9–10.3)
Chloride: 108 mmol/L (ref 98–111)
Creatinine, Ser: 0.87 mg/dL (ref 0.44–1.00)
GFR, Estimated: >60 mL/min (ref >60)
Glucose, Bld: 105 mg/dL — ABNORMAL HIGH (ref 70–99)
Potassium: 4.3 mmol/L (ref 3.5–5.1)
Sodium: 140 mmol/L (ref 135–145)

## 2022-04-03 NOTE — H&P (Signed)
Patient is a 62 year old white female presented with fever and sepsis to the emergency room on 03/16/2022. She was found to have 3 mm right distal ureteral calculus with mild hydronephrosis. Underwent cystoscopy and insertion of right JJ stent. Urine culture grew Klebsiella and she was discharged home from the hospital on 03/20/2022 on oral Duricef. Also had C. difficile infection while in the hospital and has been treated with oral vancomycin. Now here for follow-up for definitive management of her stone. In the interim the patient is continue to have some mild stent discomfort and low-grade fever up to 99-1/2 by her report.  Micro urinalysis today shows 6-10 WBCs 10-20 RBCs and few bacteria culture will be sent     CLINICAL DATA: Abdominal/flank pain with chills nausea, vomiting  and diarrhea. History of C diff   EXAM:  CT ABDOMEN AND PELVIS WITHOUT CONTRAST   TECHNIQUE:  Multidetector CT imaging of the abdomen and pelvis was performed  following the standard protocol without IV contrast.   RADIATION DOSE REDUCTION: This exam was performed according to the  departmental dose-optimization program which includes automated  exposure control, adjustment of the mA and/or kV according to  patient size and/or use of iterative reconstruction technique.   COMPARISON: 12/13/2020   FINDINGS:  Lower chest: No acute abnormality.   Hepatobiliary: No focal liver abnormality is seen. No gallstones,  gallbladder wall thickening, or biliary dilatation.   Pancreas: Unremarkable. No pancreatic ductal dilatation or  surrounding inflammatory changes.   Spleen: Normal in size without focal abnormality.   Adrenals/Urinary Tract: Unremarkable adrenal glands. 3 mm stone in  the distal right ureter. Mild right hydroureteronephrosis.  Unremarkable bladder.   Stomach/Bowel: Colonic diverticulosis without diverticulitis. No  bowel wall thickening or inflammatory change. Fatty infiltration of  the wall of  the sigmoid colon and rectum compatible with chronic  inflammation normal caliber large and small bowel. Normal appendix.  Unremarkable stomach. Small hiatal hernia.   Vascular/Lymphatic: Aortic atherosclerosis. No enlarged abdominal or  pelvic lymph nodes.   Reproductive: Uterus and bilateral adnexa are unremarkable.   Other: No free intraperitoneal fluid or air.   Musculoskeletal: No acute or significant osseous findings.   IMPRESSION:  3 mm stone in the distal right ureter with mild right  hydronephrosis.   Colonic diverticulosis without diverticulitis. No evidence of  colitis.    Electronically Signed  By: Placido Sou M.D.  On: 03/16/2022 21:16      ALLERGIES: None   MEDICATIONS: Atenolol 50 mg tablet 1 tablet PO Daily  Atorvastatin Calcium 10 mg tablet 1 tablet PO Daily  Cefadroxil 500 mg capsule 1 capsule PO Daily  Dificid 200 mg tablet 1 tablet PO Daily  Ondansetron Odt 4 mg tablet,disintegrating 1 tablet PO Daily  Valsartan 80 mg tablet 1 tablet PO Daily     GU PSH: None   NON-GU PSH: Cholecystectomy (laparoscopic)     GU PMH: None   NON-GU PMH: Hypercholesterolemia Hypertension    FAMILY HISTORY: Prostate Cancer - Father   SOCIAL HISTORY: Marital Status: Married Preferred Language: English; Ethnicity: Not Hispanic Or Latino; Race: White Current Smoking Status: Patient has never smoked.   Tobacco Use Assessment Completed: Used Tobacco in last 30 days? Does not use smokeless tobacco. Has never drank.  Does not use drugs. Drinks 1 caffeinated drink per day.    REVIEW OF SYSTEMS:    GU Review Female:   Patient reports frequent urination, hard to postpone urination, burning /pain with urination, get up at night to  urinate, and leakage of urine. Patient denies stream starts and stops, trouble starting your stream, have to strain to urinate, and being pregnant.  Gastrointestinal (Upper):   Patient reports nausea. Patient denies vomiting and  indigestion/ heartburn.  Gastrointestinal (Lower):   Patient reports diarrhea. Patient denies constipation.  Constitutional:   Patient reports night sweats and fatigue. Patient denies fever and weight loss.  Skin:   Patient denies skin rash/ lesion and itching.  Eyes:   Patient denies blurred vision and double vision.  Ears/ Nose/ Throat:   Patient denies sore throat and sinus problems.  Hematologic/Lymphatic:   Patient denies swollen glands and easy bruising.  Cardiovascular:   Patient denies leg swelling and chest pains.  Respiratory:   Patient denies cough and shortness of breath.  Endocrine:   Patient denies excessive thirst.  Musculoskeletal:   Patient denies back pain and joint pain.  Neurological:   Patient denies headaches and dizziness.  Psychologic:   Patient denies depression and anxiety.   VITAL SIGNS:      03/22/2022 08:40 AM  Weight 150 lb / 68.04 kg  Height 60 in / 152.4 cm  BP 163/104 mmHg  Pulse 76 /min  Temperature 99.1 F / 37.2 C  BMI 29.3 kg/m   GU PHYSICAL EXAMINATION:    Breast: Symmetrical. No tenderness, no nipple discharge, no skin changes. No mass.  Digital Rectal Exam: Normal sphincter tone. No rectal mass.  External Genitalia: No hirsutism, no rash, no scarring, no cyst, no erythematous lesion, no papular lesion, no blanched lesion, no warty lesion. No edema.  Urethral Meatus: Normal size. Normal position. No discharge.  Urethra: No tenderness, no mass, no scarring. No hypermobility. No leakage.  Bladder: Normal to palpation, no tenderness, no mass, normal size.  Vagina: No atrophy, no stenosis. No rectocele. No cystocele. No enterocele.  Cervix: No inflammation, no discharge, no lesion, no tenderness, no wart.  Uterus: Normal size. Normal consistency. Normal position. No mobility. No descent.  Adnexa / Parametria: No tenderness. No adnexal mass. Normal left ovary. Normal right ovary.  Anus and Perineum: No hemorrhoids. No anal stenosis. No rectal  fissure, no anal fissure. No edema, no dimple, no perineal tenderness, no anal tenderness.   MULTI-SYSTEM PHYSICAL EXAMINATION:    Constitutional: Well-nourished. No physical deformities. Normally developed. Good grooming.  Neck: Neck symmetrical, not swollen. Normal tracheal position.  Respiratory: No labored breathing, no use of accessory muscles.   Cardiovascular: Normal temperature, normal extremity pulses, no swelling, no varicosities.  Lymphatic: No enlargement of neck, axillae, groin.  Skin: No paleness, no jaundice, no cyanosis. No lesion, no ulcer, no rash.  Neurologic / Psychiatric: Oriented to time, oriented to place, oriented to person. No depression, no anxiety, no agitation.  Gastrointestinal: No mass, no tenderness, no rigidity, non obese abdomen.  Eyes: Normal conjunctivae. Normal eyelids.  Ears, Nose, Mouth, and Throat: Left ear no scars, no lesions, no masses. Right ear no scars, no lesions, no masses. Nose no scars, no lesions, no masses. Normal hearing. Normal lips.  Musculoskeletal: Normal gait and station of head and neck.     Complexity of Data:  Source Of History:  Patient  Records Review:   Previous Doctor Records, Previous Hospital Records, Previous Patient Records  Urine Test Review:   Urinalysis   PROCEDURES:          Urinalysis w/Scope Dipstick Dipstick Cont'd Micro  Color: Amber Bilirubin: Neg mg/dL WBC/hpf: 6 - 75/IEP  Appearance: Slightly Cloudy Ketones: Neg mg/dL RBC/hpf: 20 -  40/hpf  Specific Gravity: 1.025 Blood: 3+ ery/uL Bacteria: Few (10-25/hpf)  pH: 6.5 Protein: 1+ mg/dL Cystals: NS (Not Seen)  Glucose: Neg mg/dL Urobilinogen: 1.0 mg/dL Casts: Hyaline    Nitrites: Neg Trichomonas: Not Present    Leukocyte Esterase: 1+ leu/uL Mucous: Present      Epithelial Cells: 0 - 5/hpf      Yeast: NS (Not Seen)      Sperm: Not Present    ASSESSMENT:      ICD-10 Details  2 GU:   Ureteral calculus - O70.9 Acute, Complicated Injury  1 NON-GU:    Pyuria/other UA findings - G28.36 Acute, Complicated Injury   PLAN:           Orders Labs CULTURE, URINE          Document Letter(s):  Created for Patient: Clinical Summary         Notes:   Urine culture was repeated today, continue on Duricef until C&S results available. Will schedule for cystoscopy right ureteroscopy and laser lithotripsy in the near future for definitive management of her right ureteral calculus. Risk and benefits discussed as outlined below.  I have recommended retrograde pyelogram, ureteroscopic stone manipulation with laser lithotripsy. I have discussed in detail the risks, benefits and alternatives of ureteroscopic stone extraction to include but not limited to: Bleeding, infection, ureteral perforation with need for open repair, inability to place the stent necessitating the need for further procedures, possible percutaneous nephrostomy tube placement, discomfort from the stents, hematuria, urgency, frequency and refractory problems after the stent is removed. I discussed the stent is not a permanent stent and will require a followup for stent removal or stent exchange. The patient knows there is high risk for ureteral stent incrustation if this is not removed or exchanged within 3 months. Patient voices understanding of the risks and benefits of the procedure and consents to the procedure.

## 2022-04-04 ENCOUNTER — Ambulatory Visit (HOSPITAL_COMMUNITY)
Admission: RE | Admit: 2022-04-04 | Discharge: 2022-04-04 | Disposition: A | Discharge status: Home / Self Care | Payer: BC Managed Care – PPO | Attending: Urology | Admitting: Urology

## 2022-04-04 ENCOUNTER — Ambulatory Visit (HOSPITAL_COMMUNITY): Payer: BC Managed Care – PPO | Admitting: Physician Assistant

## 2022-04-04 ENCOUNTER — Encounter (HOSPITAL_COMMUNITY)
Admission: RE | Disposition: A | Discharge status: Home / Self Care | Payer: Self-pay | Source: Home / Self Care | Attending: Urology

## 2022-04-04 ENCOUNTER — Ambulatory Visit (HOSPITAL_COMMUNITY): Payer: BC Managed Care – PPO | Admitting: Certified Registered"

## 2022-04-04 ENCOUNTER — Encounter (HOSPITAL_COMMUNITY): Payer: Self-pay | Admitting: Urology

## 2022-04-04 ENCOUNTER — Ambulatory Visit (HOSPITAL_COMMUNITY): Payer: BC Managed Care – PPO

## 2022-04-04 DIAGNOSIS — N201 Calculus of ureter: Secondary | ICD-10-CM | POA: Diagnosis present

## 2022-04-04 DIAGNOSIS — Z01818 Encounter for other preprocedural examination: Secondary | ICD-10-CM

## 2022-04-04 DIAGNOSIS — R8279 Other abnormal findings on microbiological examination of urine: Secondary | ICD-10-CM | POA: Insufficient documentation

## 2022-04-04 DIAGNOSIS — K219 Gastro-esophageal reflux disease without esophagitis: Secondary | ICD-10-CM | POA: Diagnosis not present

## 2022-04-04 DIAGNOSIS — I10 Essential (primary) hypertension: Secondary | ICD-10-CM | POA: Diagnosis not present

## 2022-04-04 HISTORY — PX: CYSTOSCOPY/URETEROSCOPY/HOLMIUM LASER/STENT PLACEMENT: SHX6546

## 2022-04-04 SURGERY — CYSTOSCOPY/URETEROSCOPY/HOLMIUM LASER/STENT PLACEMENT
Anesthesia: General | Laterality: Right

## 2022-04-04 MED ORDER — MIDAZOLAM HCL 5 MG/5ML IJ SOLN
INTRAMUSCULAR | Status: DC | PRN
  Administered 2022-04-04: 2 mg via INTRAVENOUS

## 2022-04-04 MED ORDER — DEXAMETHASONE SODIUM PHOSPHATE 10 MG/ML IJ SOLN
INTRAMUSCULAR | Status: AC
  Filled 2022-04-04: qty 1

## 2022-04-04 MED ORDER — ONDANSETRON HCL 4 MG/2ML IJ SOLN
INTRAMUSCULAR | Status: DC | PRN
  Administered 2022-04-04: 4 mg via INTRAVENOUS

## 2022-04-04 MED ORDER — HYDROMORPHONE HCL 1 MG/ML IJ SOLN: 0.2500 mg | INTRAMUSCULAR | Status: DC | PRN

## 2022-04-04 MED ORDER — PROPOFOL 10 MG/ML IV BOLUS
INTRAVENOUS | Status: DC | PRN
  Administered 2022-04-04: 150 mg via INTRAVENOUS

## 2022-04-04 MED ORDER — PROPOFOL 10 MG/ML IV BOLUS
INTRAVENOUS | Status: AC
  Filled 2022-04-04: qty 20

## 2022-04-04 MED ORDER — FENTANYL CITRATE (PF) 100 MCG/2ML IJ SOLN
INTRAMUSCULAR | Status: DC | PRN
  Administered 2022-04-04 (×2): 50 ug via INTRAVENOUS

## 2022-04-04 MED ORDER — ONDANSETRON HCL 4 MG/2ML IJ SOLN: 4.0000 mg | Freq: Once | INTRAMUSCULAR | Status: DC | PRN

## 2022-04-04 MED ORDER — PHENYLEPHRINE 80 MCG/ML (10ML) SYRINGE FOR IV PUSH (FOR BLOOD PRESSURE SUPPORT)
PREFILLED_SYRINGE | INTRAVENOUS | Status: DC | PRN
  Administered 2022-04-04: 160 ug via INTRAVENOUS
  Administered 2022-04-04: 80 ug via INTRAVENOUS

## 2022-04-04 MED ORDER — ACETAMINOPHEN 10 MG/ML IV SOLN: 1000.0000 mg | Freq: Once | INTRAVENOUS | Status: DC | PRN

## 2022-04-04 MED ORDER — IOHEXOL 300 MG/ML  SOLN
INTRAMUSCULAR | Status: DC | PRN
  Administered 2022-04-04: 16 mL

## 2022-04-04 MED ORDER — FENTANYL CITRATE (PF) 100 MCG/2ML IJ SOLN
INTRAMUSCULAR | Status: AC
  Filled 2022-04-04: qty 2

## 2022-04-04 MED ORDER — CEFAZOLIN SODIUM-DEXTROSE 2-4 GM/100ML-% IV SOLN
2.0000 g | INTRAVENOUS | Status: AC
  Administered 2022-04-04: 2 g via INTRAVENOUS
  Filled 2022-04-04: qty 100

## 2022-04-04 MED ORDER — CHLORHEXIDINE GLUCONATE 0.12 % MT SOLN
15.0000 mL | Freq: Once | OROMUCOSAL | Status: AC
  Administered 2022-04-04: 15 mL via OROMUCOSAL

## 2022-04-04 MED ORDER — PHENYLEPHRINE 80 MCG/ML (10ML) SYRINGE FOR IV PUSH (FOR BLOOD PRESSURE SUPPORT)
PREFILLED_SYRINGE | INTRAVENOUS | Status: AC
  Filled 2022-04-04: qty 30

## 2022-04-04 MED ORDER — AMISULPRIDE (ANTIEMETIC) 5 MG/2ML IV SOLN: 10.0000 mg | Freq: Once | INTRAVENOUS | Status: DC | PRN

## 2022-04-04 MED ORDER — KETOROLAC TROMETHAMINE 15 MG/ML IJ SOLN
INTRAMUSCULAR | Status: DC | PRN
  Administered 2022-04-04: 15 mg via INTRAVENOUS

## 2022-04-04 MED ORDER — OXYCODONE HCL 5 MG/5ML PO SOLN: 5.0000 mg | Freq: Once | ORAL | Status: DC | PRN

## 2022-04-04 MED ORDER — SODIUM CHLORIDE 0.9 % IR SOLN
Status: DC | PRN
  Administered 2022-04-04 (×2): 3000 mL

## 2022-04-04 MED ORDER — OXYCODONE HCL 5 MG PO TABS: 5.0000 mg | ORAL_TABLET | Freq: Once | ORAL | Status: DC | PRN

## 2022-04-04 MED ORDER — ORAL CARE MOUTH RINSE: 15.0000 mL | Freq: Once | OROMUCOSAL | Status: AC

## 2022-04-04 MED ORDER — LIDOCAINE HCL (PF) 2 % IJ SOLN
INTRAMUSCULAR | Status: AC
  Filled 2022-04-04: qty 5

## 2022-04-04 MED ORDER — LIDOCAINE 2% (20 MG/ML) 5 ML SYRINGE
INTRAMUSCULAR | Status: DC | PRN
  Administered 2022-04-04: 80 mg via INTRAVENOUS

## 2022-04-04 MED ORDER — LACTATED RINGERS IV SOLN: INTRAVENOUS | Status: DC

## 2022-04-04 MED ORDER — DEXAMETHASONE SODIUM PHOSPHATE 10 MG/ML IJ SOLN
INTRAMUSCULAR | Status: DC | PRN
  Administered 2022-04-04: 4 mg via INTRAVENOUS

## 2022-04-04 MED ORDER — TRAMADOL HCL 50 MG PO TABS: 50.0000 mg | ORAL_TABLET | Freq: Four times a day (QID) | ORAL | 0 refills | Status: DC | PRN

## 2022-04-04 MED ORDER — ONDANSETRON HCL 4 MG/2ML IJ SOLN
INTRAMUSCULAR | Status: AC
  Filled 2022-04-04: qty 2

## 2022-04-04 MED ORDER — MIDAZOLAM HCL 2 MG/2ML IJ SOLN
INTRAMUSCULAR | Status: AC
  Filled 2022-04-04: qty 2

## 2022-04-04 SURGICAL SUPPLY — 22 items
BAG URO CATCHER STRL LF (MISCELLANEOUS) ×1 IMPLANT
BASKET ZERO TIP NITINOL 2.4FR (BASKET) IMPLANT
BSKT STON RTRVL ZERO TP 2.4FR (BASKET)
BULB IRRIG PATHFIND (MISCELLANEOUS) ×1 IMPLANT
CATH URETL OPEN 5X70 (CATHETERS) IMPLANT
CLOTH BEACON ORANGE TIMEOUT ST (SAFETY) ×1 IMPLANT
EXTRACTOR STONE 1.7FRX115CM (UROLOGICAL SUPPLIES) IMPLANT
GLOVE SURG LX STRL 7.5 STRW (GLOVE) ×2 IMPLANT
GOWN STRL REUS W/ TWL XL LVL3 (GOWN DISPOSABLE) ×2 IMPLANT
GOWN STRL REUS W/TWL XL LVL3 (GOWN DISPOSABLE) ×1
GUIDEWIRE ANG ZIPWIRE 038X150 (WIRE) IMPLANT
GUIDEWIRE STR DUAL SENSOR (WIRE) ×1 IMPLANT
KIT TURNOVER KIT A (KITS) IMPLANT
LASER FIB FLEXIVA PULSE ID 365 (Laser) IMPLANT
MANIFOLD NEPTUNE II (INSTRUMENTS) ×2 IMPLANT
PACK CYSTO (CUSTOM PROCEDURE TRAY) ×1 IMPLANT
SHEATH NAVIGATOR HD 11/13X36 (SHEATH) IMPLANT
SYR 20ML LL LF (SYRINGE) ×2 IMPLANT
TRACTIP FLEXIVA PULS ID 200XHI (Laser) IMPLANT
TRACTIP FLEXIVA PULSE ID 200 (Laser)
TUBING CONNECTING 10 (TUBING) ×1 IMPLANT
TUBING UROLOGY SET (TUBING) ×1 IMPLANT

## 2022-04-04 NOTE — Anesthesia Preprocedure Evaluation (Signed)
Anesthesia Evaluation  Patient identified by MRN, date of birth, ID band Patient awake    Reviewed: Allergy & Precautions, NPO status , Patient's Chart, lab work & pertinent test results  Airway Mallampati: II  TM Distance: >3 FB Neck ROM: Full    Dental no notable dental hx. (+) Teeth Intact, Dental Advisory Given   Pulmonary    Pulmonary exam normal breath sounds clear to auscultation       Cardiovascular hypertension, Normal cardiovascular exam Rhythm:Regular Rate:Normal     Neuro/Psych  Headaches PSYCHIATRIC DISORDERS Anxiety        GI/Hepatic ,GERD  Medicated and Controlled,,  Endo/Other  diabetes    Renal/GU Renal diseaseLab Results      Component                Value               Date                      CREATININE               0.87                03/30/2022                    K                        4.3                 03/30/2022                    Musculoskeletal   Abdominal   Peds  Hematology Lab Results      Component                Value               Date                      WBC                      7.6                 03/30/2022                HGB                      12.4                03/30/2022                HCT                      39.9                03/30/2022                MCV                      91.3                03/30/2022                PLT                      368  03/30/2022              Anesthesia Other Findings   Reproductive/Obstetrics                             Anesthesia Physical Anesthesia Plan  ASA: 3  Anesthesia Plan: General   Post-op Pain Management: Minimal or no pain anticipated   Induction: Intravenous  PONV Risk Score and Plan: Treatment may vary due to age or medical condition, Ondansetron, Midazolam and Dexamethasone  Airway Management Planned: LMA  Additional Equipment: None  Intra-op Plan:    Post-operative Plan: Extubation in OR  Informed Consent:      Dental advisory given  Plan Discussed with:   Anesthesia Plan Comments:        Anesthesia Quick Evaluation

## 2022-04-04 NOTE — Op Note (Signed)
Preoperative diagnosis:  1.  Right distal ureteral calculus  Postoperative diagnosis: 1.  Right distal ureteral calculus  Procedure(s): 1.  Cystoscopy, removal of right JJ stent, right retrograde pyelogram with intraoperative interpretation, right ureteroscopy with stone extraction  Surgeon: Dr. Harold Barban  Anesthesia: General  Complications: None  EBL: Minimal  Specimens: Stone fragment  Disposition of specimens: With patient  Intraoperative findings: Small distal ureteral calculus 2 mm in size, extracted utilizing engage basket.  Ureteroscopy proximally showed no remaining stone and retrograde showed no evidence of filling defect.  Right JJ stent was not replaced as there was no ureteral trauma and ureter widely patent.  Indication: 62 year old white female who presented approxi-2 weeks ago with obstructing right ureteral calculus and urosepsis.  She was treated with urgent stent and presents now to undergo ureteroscopy and stone extraction for definitive management of her ureteral calculus.  Description of procedure:  After obtaining form consent the patient was taken major cystoscopy suite placed under general anesthesia.  Placed in dorsolithotomy position genitalia prepped and draped in usual sterile fashion.  Proper pause timeout was performed for site of procedure.  73 French cystoscope advanced in the bladder without difficulty.  Right ureteral stent was identified and was removed utilizing alligator grasper.  5 French open tip catheter was utilized to perform retrograde pyelogram on the right side which showed a questionable filling defect in the right distal ureter but no proximal filling defects.  Cystoscope was removed 6.4 French semirigid ureteroscope was advanced in the bladder without difficulty.  This was easily able to pass inside the right ureteral orifice which was widely patent.  Approximately 2 cm proximal into the ureter a small fragment was noted with attached  clot.  Engage basket was utilized to extract this fragment.  Scope was then reintroduced atraumatically into the right ureter and advanced along its length into the proximal ureter no remaining stone fragments were seen.  Retrograde pyelogram through the scope revealed good integrity of the ureter and collecting system with no extravasation.  The ureter emptied out promptly upon removal of the ureteroscope.  It was felt due to to the widely patent nature of the ureter from prior stent and atraumatic stone extraction that right JJ stent would not be replaced.  Bladder was emptied procedure was terminated.  She was awakened from anesthesia and taken back to the recovery in stable condition.  No immediate complication from the procedure.

## 2022-04-04 NOTE — Transfer of Care (Signed)
Immediate Anesthesia Transfer of Care Note  Patient: Sheryl Lowe  Procedure(s) Performed: CYSTOSCOPY/URETEROSCOPY/HOLMIUM LASER/STENT EXCHANGE (Right)  Patient Location: PACU  Anesthesia Type:General  Level of Consciousness: awake, alert , and patient cooperative  Airway & Oxygen Therapy: Patient Spontanous Breathing and Patient connected to nasal cannula oxygen  Post-op Assessment: Report given to RN and Post -op Vital signs reviewed and stable  Post vital signs: Reviewed and stable  Last Vitals:  Vitals Value Taken Time  BP 147/77 04/04/22 1233  Temp 36.2 C 04/04/22 1233  Pulse 77 04/04/22 1237  Resp 12 04/04/22 1237  SpO2 97 % 04/04/22 1237  Vitals shown include unvalidated device data.  Last Pain:  Vitals:   04/04/22 0940  TempSrc:   PainSc: 4       Patients Stated Pain Goal: 4 (34/91/79 1505)  Complications: No notable events documented.

## 2022-04-04 NOTE — Anesthesia Postprocedure Evaluation (Signed)
Anesthesia Post Note  Patient: Barista  Procedure(s) Performed: CYSTOSCOPY/URETEROSCOPY/HOLMIUM LASER/STENT EXCHANGE (Right)     Patient location during evaluation: PACU Anesthesia Type: General Level of consciousness: awake and alert Pain management: pain level controlled Vital Signs Assessment: post-procedure vital signs reviewed and stable Respiratory status: spontaneous breathing, nonlabored ventilation, respiratory function stable and patient connected to nasal cannula oxygen Cardiovascular status: blood pressure returned to baseline and stable Postop Assessment: no apparent nausea or vomiting Anesthetic complications: no  No notable events documented.  Last Vitals:  Vitals:   04/04/22 1300 04/04/22 1315  BP: 119/65 (!) 117/53  Pulse: 67 74  Resp: 14 19  Temp:    SpO2: 93% 98%    Last Pain:  Vitals:   04/04/22 1315  TempSrc:   PainSc: 0-No pain                 Barnet Glasgow

## 2022-04-04 NOTE — Anesthesia Procedure Notes (Signed)
Procedure Name: LMA Insertion Date/Time: 04/04/2022 11:55 AM  Performed by: West Pugh, CRNAPre-anesthesia Checklist: Patient identified, Emergency Drugs available, Suction available, Patient being monitored and Timeout performed Patient Re-evaluated:Patient Re-evaluated prior to induction Oxygen Delivery Method: Circle system utilized Preoxygenation: Pre-oxygenation with 100% oxygen Induction Type: IV induction LMA: LMA with gastric port inserted LMA Size: 3.0 Number of attempts: 1 Placement Confirmation: positive ETCO2 Tube secured with: Tape Dental Injury: Teeth and Oropharynx as per pre-operative assessment

## 2022-04-04 NOTE — Interval H&P Note (Signed)
History and Physical Interval Note:  04/04/2022 11:17 AM  Sheryl Lowe  has presented today for surgery, with the diagnosis of RIGHT URETERAL CALCULUS.  The various methods of treatment have been discussed with the patient and family. After consideration of risks, benefits and other options for treatment, the patient has consented to  Procedure(s) with comments: CYSTOSCOPY/URETEROSCOPY/HOLMIUM LASER/STENT EXCHANGE (Right) - 30 MINS as a surgical intervention.  The patient's history has been reviewed, patient examined, no change in status, stable for surgery.  I have reviewed the patient's chart and labs.  Questions were answered to the patient's satisfaction.     [name]

## 2022-04-05 ENCOUNTER — Encounter (HOSPITAL_COMMUNITY): Payer: Self-pay | Admitting: Urology

## 2022-04-25 ENCOUNTER — Ambulatory Visit: Payer: BC Managed Care – PPO | Admitting: Internal Medicine

## 2022-08-16 DIAGNOSIS — R748 Abnormal levels of other serum enzymes: Secondary | ICD-10-CM

## 2022-08-16 DIAGNOSIS — K760 Fatty (change of) liver, not elsewhere classified: Secondary | ICD-10-CM

## 2022-09-27 ENCOUNTER — Encounter: Payer: Self-pay | Admitting: Gastroenterology

## 2022-09-27 ENCOUNTER — Ambulatory Visit (INDEPENDENT_AMBULATORY_CARE_PROVIDER_SITE_OTHER): Payer: BC Managed Care – PPO | Admitting: Gastroenterology

## 2022-09-27 VITALS — BP 130/84 | HR 98 | Temp 99.0°F | Ht 60.0 in | Wt 153.4 lb

## 2022-09-27 DIAGNOSIS — R112 Nausea with vomiting, unspecified: Secondary | ICD-10-CM

## 2022-09-27 DIAGNOSIS — K7581 Nonalcoholic steatohepatitis (NASH): Secondary | ICD-10-CM

## 2022-09-27 DIAGNOSIS — K219 Gastro-esophageal reflux disease without esophagitis: Secondary | ICD-10-CM

## 2022-09-27 NOTE — Progress Notes (Signed)
Gastroenterology Office Note     Primary Care Physician:  Pollyann Savoy, NP  Primary Gastroenterologist: Dr. Marletta Lor    Chief Complaint   Chief Complaint  Patient presents with   Diarrhea    6 month follow up on c diff diarrhea. Diarrhea has stopped and states she has not had any more c diff issues.    Gastroesophageal Reflux    Follow up on GERD. Having a lot of coughing.    Emesis    Has been having nausea and vomiting since starting ozempic. Only took one injection and states she will not take another one. Took it last Saturday. She did have some symptoms the day before the injection but worse two days after taking injection.      History of Present Illness   Sheryl Lowe is a 62 y.o. 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 She remained on a prolonged course of vancomycin once a day while being treated for UTI as a prophylactic measure.   Cough X 1 month. No new medications. Ate Outback recently and vomited till 3 am. Started Ozempic Saturday. Felt nauseated next day and had vomiting. She is not taking Ozempic any longer. Feels intermittently nauseated. Having backwash. Taking omeprazole daily.   RUQ discomfort that is fleeting. Noted chronically. Lasts at most 30 seconds. [place] in July 2023 with hepatic steatosis and prominent CBD s/p cholecystectomy. LFTs normal at that time. Recent HFP with alk phos 140 in June otherwise normal; she was not fasting at that time.   No recurrent diarrhea.    Past Medical History:  Diagnosis Date   Clostridium difficile infection    Complication of anesthesia    slow to wake up - with gallbladder surgery   History of kidney stones    Hyperlipidemia    Hypertension    Kidney stones    Migraine      Past Surgical History:  Procedure Laterality Date   BIOPSY  02/04/2021   Procedure: BIOPSY;  Surgeon: Lanelle Bal, DO;  [facility];  Service: Endoscopy;;   CHOLECYSTECTOMY  2020   COLONOSCOPY WITH PROPOFOL N/A 02/04/2021   nonbleeding internal hemorrhoids, exam otherwise normal.  Repeat in 10 years   CYSTOSCOPY WITH RETROGRADE PYELOGRAM, URETEROSCOPY AND STENT PLACEMENT Right 03/16/2022   Procedure: CYSTOSCOPY WITH RETROGRADE PYELOGRAM AND STENT PLACEMENT;  Surgeon: Belva Agee, MD;  Location: [facility];  Service: Urology;  Laterality: Right;   CYSTOSCOPY/URETEROSCOPY/HOLMIUM LASER/STENT PLACEMENT Right 04/04/2022   Procedure: CYSTOSCOPY/URETEROSCOPY/HOLMIUM LASER/STENT EXCHANGE;  Surgeon: Belva Agee, MD;  Location: [facility];  Service: Urology;  Laterality: Right;  stent removal   ESOPHAGOGASTRODUODENOSCOPY (EGD) WITH PROPOFOL N/A 02/04/2021   esophageal mucosal changes suspicious for short segment Barrett's esophagus (intestinal metaplasia), gastritis (mild nonspecific reactive gastropathy), normal duodenum, small hiatal hernia. Repeat EGD in 3 years.    Current Outpatient Medications  Medication Sig Dispense Refill   atenolol (TENORMIN) 50 MG tablet Take 50 mg by mouth daily.     atorvastatin (LIPITOR) 10 MG tablet Take 1 tablet (10 mg total) by mouth daily. Need f/u visit for additional refills 30 tablet 0   GLIPIZIDE PO Take by mouth. One daily     omeprazole (PRILOSEC) 20 MG capsule Take 20 mg by mouth daily.  valsartan (DIOVAN) 80 MG tablet Take 80 mg by mouth daily.     No current facility-administered medications for this visit.    Allergies as of 09/27/2022 - Review Complete 09/27/2022  Allergen Reaction Noted   Amitriptyline  10/18/2015   Topamax [topiramate]  10/18/2015    Family History  Problem Relation Age of Onset   Stroke Mother    Heart disease Mother    Hypertension Father    Cancer Father    Colon cancer Neg Hx    Colon  polyps Neg Hx     Social History   Socioeconomic History   Marital status: Married    Spouse name: Not on file   Number of children: Not on file   Years of education: Not on file   Highest education level: Not on file  Occupational History   Not on file  Tobacco Use   Smoking status: Never    Passive exposure: Never   Smokeless tobacco: Never  Vaping Use   Vaping status: Never Used  Substance and Sexual Activity   Alcohol use: Not Currently   Drug use: No   Sexual activity: Not on file  Other Topics Concern   Not on file  Social History Narrative   Not on file   Social Determinants of Health   Financial Resource Strain: Not on file  Food Insecurity: No Food Insecurity (03/17/2022)   Hunger Vital Sign    Worried About Running Out of Food in the Last Year: Never true    Ran Out of Food in the Last Year: Never true  Transportation Needs: No Transportation Needs (03/17/2022)   PRAPARE - Administrator, Civil Service (Medical): No    Lack of Transportation (Non-Medical): No  Physical Activity: Not on file  Stress: Not on file  Social Connections: Not on file  Intimate Partner Violence: Not At Risk (03/17/2022)   Humiliation, Afraid, Rape, and Kick questionnaire    Fear of Current or Ex-Partner: No    Emotionally Abused: No    Physically Abused: No    Sexually Abused: No     Review of Systems   Gen: Denies any fever, chills, fatigue, weight loss, lack of appetite.  CV: Denies chest pain, heart palpitations, peripheral edema, syncope.  Resp: Denies shortness of breath at rest or with exertion. Denies wheezing or cough.  GI: Denies dysphagia or odynophagia. Denies jaundice, hematemesis, fecal incontinence. GU : Denies urinary burning, urinary frequency, urinary hesitancy MS: Denies joint pain, muscle weakness, cramps, or limitation of movement.  Derm: Denies rash, itching, dry skin Psych: Denies depression, anxiety, memory loss, and confusion Heme: Denies  bruising, bleeding, and enlarged lymph nodes.   Physical Exam   BP 130/84 (BP Location: Left Arm, Patient Position: Sitting, Cuff Size: Normal)   Pulse 98   Temp 99 F (37.2 C) (Oral)   Ht 5' (1.524 m)   Wt 153 lb 6.4 oz (69.6 kg)   BMI 29.96 kg/m  General:   Alert and oriented. Pleasant and cooperative. Well-nourished and well-developed.  Head:  Normocephalic and atraumatic. Eyes:  Without icterus Abdomen:  +BS, soft, non-tender and non-distended. No HSM noted. No guarding or rebound. No masses appreciated.  Rectal:  Deferred  Msk:  Symmetrical without gross deformities. Normal posture. Extremities:  Without edema. Neurologic:  Alert and  oriented x4;  grossly normal neurologically. Skin:  Intact without significant lesions or rashes. Psych:  Alert and cooperative. Normal mood and affect.   Assessment  Sheryl Lowe is a 62 y.o. 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   Cdiff: symptoms resolved. Recommend vanc prophylactically if having to take antibiotics for other reasons.   GERD: noting cough X 1 month. Query LPR. Continue omeprazole and add Pepcid. Message if no improvement.  N/V: after eating Outback. Query uncontrolled GERD contributing. Unable to tolerate Ozempic due to N/V; this has been discontinued. In setting of diabetes could already have transient delayed gastric emptying.   RUQ discomfort: at least past year. Fleeting. [place] last year with hepatic steatosis and prominent CBD s/p cholecystectomy. Update HFP today. Will check ELF as well. If abnormal LFTs, needs imaging.     PLAN    HFP and ELF labs today Continue omeprazole, add Pepcid in evening Call if further N/V: may need GES vs EGD 3 month  follow-up    Gelene Mink, PhD, ANP-BC [facility]

## 2022-09-27 NOTE — Patient Instructions (Signed)
Let's continue omeprazole once daily, 30 minutes before breakfast, and add famoitidine (Pepcid) in evening. Let me know if this is not helpful!  Please have blood work done today.  Call if any further issues or no improvement!  We will see you in 3 months, and enjoy the rest of the summer!  I enjoyed seeing you again today! I value our relationship and want to provide genuine, compassionate, and quality care. You may receive a survey regarding your visit with me, and I welcome your feedback! Thanks so much for taking the time to complete this. I look forward to seeing you again.      Gelene Mink, PhD, ANP-BC [facility]

## 2022-09-28 LAB — HEPATIC FUNCTION PANEL
ALT: 19 IU/L (ref 0–32)
AST: 19 IU/L (ref 0–40)
Alkaline Phosphatase: 138 IU/L — ABNORMAL HIGH (ref 44–121)
Bilirubin, Direct: 0.15 mg/dL (ref 0.00–0.40)
Total Protein: 7.9 g/dL (ref 6.0–8.5)

## 2022-09-28 LAB — ENHANCED LIVER FIBROSIS (ELF)

## 2022-10-03 NOTE — Telephone Encounter (Signed)
Please arrange US abdomen with elastography. Reason: elevated alk phos, hepatic steatosis.

## 2022-10-17 ENCOUNTER — Ambulatory Visit (HOSPITAL_COMMUNITY)
Admission: RE | Admit: 2022-10-17 | Discharge: 2022-10-17 | Disposition: A | Discharge status: Home / Self Care | Payer: BC Managed Care – PPO | Source: Ambulatory Visit | Attending: Gastroenterology | Admitting: Gastroenterology

## 2022-10-17 DIAGNOSIS — R748 Abnormal levels of other serum enzymes: Secondary | ICD-10-CM

## 2022-10-17 DIAGNOSIS — K760 Fatty (change of) liver, not elsewhere classified: Secondary | ICD-10-CM | POA: Diagnosis present

## 2022-10-26 ENCOUNTER — Other Ambulatory Visit: Payer: Self-pay

## 2022-10-26 DIAGNOSIS — K7581 Nonalcoholic steatohepatitis (NASH): Secondary | ICD-10-CM

## 2022-10-26 DIAGNOSIS — N10 Acute tubulo-interstitial nephritis: Secondary | ICD-10-CM

## 2022-10-26 DIAGNOSIS — R748 Abnormal levels of other serum enzymes: Secondary | ICD-10-CM

## 2022-11-06 LAB — HEPATIC FUNCTION PANEL
ALT: 21 IU/L (ref 0–32)
AST: 20 IU/L (ref 0–40)
Albumin: 4.5 g/dL (ref 3.9–4.9)
Alkaline Phosphatase: 138 IU/L — ABNORMAL HIGH (ref 44–121)
Bilirubin Total: 0.3 mg/dL (ref 0.0–1.2)
Bilirubin, Direct: <0.1 mg/dL (ref 0.00–0.40)
Total Protein: 7.6 g/dL (ref 6.0–8.5)

## 2022-11-06 LAB — ALKALINE PHOSPHATASE, ISOENZYMES
BONE FRACTION: 66 % (ref 14–68)
INTESTINAL FRAC.: 2 % (ref 0–18)
LIVER FRACTION: 32 % (ref 18–85)

## 2023-01-02 ENCOUNTER — Ambulatory Visit: Payer: BC Managed Care – PPO | Admitting: Gastroenterology

## 2023-01-17 ENCOUNTER — Ambulatory Visit (INDEPENDENT_AMBULATORY_CARE_PROVIDER_SITE_OTHER): Payer: BC Managed Care – PPO | Admitting: Gastroenterology

## 2023-01-17 ENCOUNTER — Encounter: Payer: Self-pay | Admitting: Gastroenterology

## 2023-01-17 VITALS — BP 137/87 | HR 83 | Temp 97.5°F | Ht 60.0 in | Wt 163.2 lb

## 2023-01-17 DIAGNOSIS — K219 Gastro-esophageal reflux disease without esophagitis: Secondary | ICD-10-CM | POA: Diagnosis not present

## 2023-01-17 DIAGNOSIS — K760 Fatty (change of) liver, not elsewhere classified: Secondary | ICD-10-CM | POA: Diagnosis not present

## 2023-01-17 DIAGNOSIS — R748 Abnormal levels of other serum enzymes: Secondary | ICD-10-CM | POA: Diagnosis not present

## 2023-01-17 DIAGNOSIS — K2270 Barrett's esophagus without dysplasia: Secondary | ICD-10-CM | POA: Diagnosis not present

## 2023-01-17 NOTE — Progress Notes (Signed)
Gastroenterology Office Note     Primary Care Physician:  Pollyann Savoy, NP  Primary Gastroenterologist: Dr. Marletta Lor    Chief Complaint   Chief Complaint  Patient presents with   Follow-up    Patient here today to [facility]. Patient denies any current issues. Had some liver labs done in July and August this year that she would like to discuss.      History of Present Illness   Sheryl Lowe is a 62 y.o. 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 She remained on a prolonged course of vancomycin once a day while being treated for UTI as a prophylactic measure).    Previously with RUQ at last visit. Known hepatic steatosis. Isolated elevation of alk phos recently noted.  Elastograph with kPa ration 0.4 so less reliable results.  No significant fibrosis. Hepatomegaly. No splenomegaly. CBD 8 mm. 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 Alk Phos 138. New onset. No elevated transaminases. ELF with low risk of 9.67.    RUQ pain still present but not as prominent. Dependent on what she eats. Interested in trying weight loss injections again. Omeprazole daily. Pepcid prn. Coughing improved. No dysphagia. Sometimes during the day will feel like something is pushing. No constipation. No diarrhea.     Colonoscopy Dec 2022: non-bleeding internal hemorrhoids, repeat in 10 years  EGD Dec 2022 with short-segment Barrett's. Due for surveillance 2025.   Past Medical History:  Diagnosis Date   Clostridium difficile infection    Complication of anesthesia    slow to wake up - with gallbladder surgery   History of kidney stones    Hyperlipidemia    Hypertension    Kidney stones    Migraine     Past Surgical History:  Procedure Laterality Date    BIOPSY  02/04/2021   Procedure: BIOPSY;  Surgeon: Lanelle Bal, DO;  [facility];  Service: Endoscopy;;   CHOLECYSTECTOMY  2020   COLONOSCOPY WITH PROPOFOL N/A 02/04/2021   nonbleeding internal hemorrhoids, exam otherwise normal.  Repeat in 10 years   CYSTOSCOPY WITH RETROGRADE PYELOGRAM, URETEROSCOPY AND STENT PLACEMENT Right 03/16/2022   Procedure: CYSTOSCOPY WITH RETROGRADE PYELOGRAM AND STENT PLACEMENT;  Surgeon: Belva Agee, MD;  Location: [facility];  Service: Urology;  Laterality: Right;   CYSTOSCOPY/URETEROSCOPY/HOLMIUM LASER/STENT PLACEMENT Right 04/04/2022   Procedure: CYSTOSCOPY/URETEROSCOPY/HOLMIUM LASER/STENT EXCHANGE;  Surgeon: Belva Agee, MD;  Location: [facility];  Service: Urology;  Laterality: Right;  stent removal   ESOPHAGOGASTRODUODENOSCOPY (EGD) WITH PROPOFOL N/A 02/04/2021   esophageal mucosal changes suspicious for short segment Barrett's esophagus (intestinal metaplasia), gastritis (mild nonspecific reactive gastropathy), normal duodenum, small hiatal hernia. Repeat EGD in 3 years.    Current Outpatient Medications  Medication Sig Dispense Refill   atenolol (TENORMIN) 50 MG tablet Take 50 mg by mouth daily.     atorvastatin (LIPITOR) 10 MG tablet Take 1 tablet (10 mg total) by mouth daily. Need f/u visit for additional refills 30 tablet 0   GLIPIZIDE PO Take by mouth. One daily     omeprazole (PRILOSEC) 20 MG capsule Take 20 mg by mouth daily.     valsartan (DIOVAN) 80 MG tablet Take 80 mg by mouth daily.     No current facility-administered medications for this visit.    Allergies as  of 01/17/2023 - Review Complete 01/17/2023  Allergen Reaction Noted   Amitriptyline  10/18/2015   Topamax [topiramate]  10/18/2015    Family History  Problem Relation Age of Onset   Stroke Mother    Heart disease Mother    Hypertension Father    Cancer Father    Colon cancer Neg Hx    Colon polyps Neg Hx     Social History   Socioeconomic History    Marital status: Married    Spouse name: Not on file   Number of children: Not on file   Years of education: Not on file   Highest education level: Not on file  Occupational History   Not on file  Tobacco Use   Smoking status: Never    Passive exposure: Never   Smokeless tobacco: Never  Vaping Use   Vaping status: Never Used  Substance and Sexual Activity   Alcohol use: Not Currently   Drug use: No   Sexual activity: Not on file  Other Topics Concern   Not on file  Social History Narrative   Not on file   Social Determinants of Health   Financial Resource Strain: Not on file  Food Insecurity: No Food Insecurity (03/17/2022)   Hunger Vital Sign    Worried About Running Out of Food in the Last Year: Never true    Ran Out of Food in the Last Year: Never true  Transportation Needs: No Transportation Needs (03/17/2022)   PRAPARE - Administrator, Civil Service (Medical): No    Lack of Transportation (Non-Medical): No  Physical Activity: Not on file  Stress: Not on file  Social Connections: Not on file  Intimate Partner Violence: Not At Risk (03/17/2022)   Humiliation, Afraid, Rape, and Kick questionnaire    Fear of Current or Ex-Partner: No    Emotionally Abused: No    Physically Abused: No    Sexually Abused: No     Review of Systems   Gen: Denies any fever, chills, fatigue, weight loss, lack of appetite.  CV: Denies chest pain, heart palpitations, peripheral edema, syncope.  Resp: Denies shortness of breath at rest or with exertion. Denies wheezing or cough.  GI: Denies dysphagia or odynophagia. Denies jaundice, hematemesis, fecal incontinence. GU : Denies urinary burning, urinary frequency, urinary hesitancy MS: Denies joint pain, muscle weakness, cramps, or limitation of movement.  Derm: Denies rash, itching, dry skin Psych: Denies depression, anxiety, memory loss, and confusion Heme: Denies bruising, bleeding, and enlarged lymph nodes.   Physical Exam    BP 137/87 (BP Location: Left Arm, Patient Position: Sitting, Cuff Size: Large)   Pulse 83   Temp (!) 97.5 F (36.4 C) (Temporal)   Ht 5' (1.524 m)   Wt 163 lb 3.2 oz (74 kg)   BMI 31.87 kg/m  General:   Alert and oriented. Pleasant and cooperative. Well-nourished and well-developed.  Head:  Normocephalic and atraumatic. Eyes:  Without icterus Abdomen:  +BS, soft, non-tender and non-distended. No HSM noted. No guarding or rebound. No masses appreciated.  Rectal:  Deferred  Msk:  Symmetrical without gross deformities. Normal posture. Extremities:  Without edema. Neurologic:  Alert and  oriented x4;  grossly normal neurologically. Skin:  Intact without significant lesions or rashes. Psych:  Alert and cooperative. Normal mood and affect.   Assessment   Sheryl Lowe is a 62 y.o. female presenting today with a history of Cdiff in 2022/2024, Barrett's esophagus, hepatic steatosis, and GERD, returning for follow-up.  Barrett's esophagus: surveillance due in 2025. GERD controlled on once daily PPI. Will take Pepcid prn if needed in evenings.   Hepatic steatosis: no significant fibrosis on elastography but kPa ration was 0.4. ELF score low risk. Only has slightly elevated alk phos, otherwise normal transaminases and bilirubin. CBD 8 mm s/p cholecystectomy. Alk phos isoenzymes moreso bone origin. To be thorough, will check celiac serologies, update HFP, and add AMA. May need MRCP as she does have vague RUQ discomfort and unable to rule out microlithiasis or sludge.      PLAN   Labs today (HFP, celiac serologies, AMA) Consider MRCP. Reviewing labs first Continue PPI EGD in 2025 Return in Jan 2025   Gelene Mink, PhD, [facility]

## 2023-01-17 NOTE — Patient Instructions (Signed)
Please have labs done at [facility]. It is best done if fasting.  After this is reviewed, I will message regarding need for MRI!  We will see you in 3 months!  I enjoyed seeing you again today! I value our relationship and want to provide genuine, compassionate, and quality care. You may receive a survey regarding your visit with me, and I welcome your feedback! Thanks so much for taking the time to complete this. I look forward to seeing you again.      Gelene Mink, PhD, ANP-BC [facility]

## 2023-01-23 LAB — HEPATIC FUNCTION PANEL
ALT: 21 [IU]/L (ref 0–32)
AST: 24 [IU]/L (ref 0–40)
Albumin: 4.2 g/dL (ref 3.9–4.9)
Alkaline Phosphatase: 159 [IU]/L — ABNORMAL HIGH (ref 44–121)
Bilirubin Total: 0.2 mg/dL (ref 0.0–1.2)
Bilirubin, Direct: 0.08 mg/dL (ref 0.00–0.40)
Total Protein: 7.1 g/dL (ref 6.0–8.5)

## 2023-01-23 LAB — MITOCHONDRIAL ANTIBODIES: Mitochondrial Ab: <20 U (ref 0.0–20.0)

## 2023-01-23 LAB — IGA: IgA/Immunoglobulin A, Serum: 352 mg/dL (ref 87–352)

## 2023-01-23 LAB — TISSUE TRANSGLUTAMINASE, IGA: Transglutaminase IgA: <2 U/mL (ref 0–3)

## 2023-02-05 ENCOUNTER — Other Ambulatory Visit: Payer: Self-pay | Admitting: *Deleted

## 2023-02-05 ENCOUNTER — Encounter: Payer: Self-pay | Admitting: *Deleted

## 2023-02-05 DIAGNOSIS — R748 Abnormal levels of other serum enzymes: Secondary | ICD-10-CM

## 2023-02-05 DIAGNOSIS — R1011 Right upper quadrant pain: Secondary | ICD-10-CM

## 2023-02-10 ENCOUNTER — Ambulatory Visit (HOSPITAL_COMMUNITY): Payer: BC Managed Care – PPO

## 2023-03-22 DIAGNOSIS — R1011 Right upper quadrant pain: Secondary | ICD-10-CM

## 2023-03-27 NOTE — Addendum Note (Signed)
Addended by: Armstead Peaks on: 03/27/2023 10:52 AM   Modules accepted: Orders

## 2023-03-27 NOTE — Telephone Encounter (Signed)
PA approved via carelon Order ID: [id number]       Authorized  Approval Valid Through: 03/27/2023 - 04/25/2023

## 2023-03-27 NOTE — Telephone Encounter (Signed)
Please arrange MRI/MRCP, chronic RUQ abdominal pain

## 2023-04-03 ENCOUNTER — Ambulatory Visit (HOSPITAL_COMMUNITY)
Admission: RE | Admit: 2023-04-03 | Discharge: 2023-04-03 | Disposition: A | Discharge status: Home / Self Care | Payer: BC Managed Care – PPO | Source: Ambulatory Visit | Attending: Family Medicine | Admitting: Family Medicine

## 2023-04-03 ENCOUNTER — Other Ambulatory Visit (HOSPITAL_COMMUNITY): Payer: Self-pay | Admitting: Gastroenterology

## 2023-04-03 DIAGNOSIS — R748 Abnormal levels of other serum enzymes: Secondary | ICD-10-CM | POA: Insufficient documentation

## 2023-04-03 DIAGNOSIS — R1011 Right upper quadrant pain: Secondary | ICD-10-CM | POA: Diagnosis present

## 2023-04-03 MED ORDER — GADOBUTROL 1 MMOL/ML IV SOLN
7.4000 mL | Freq: Once | INTRAVENOUS | Status: AC | PRN
  Administered 2023-04-03: 7.4 mL via INTRAVENOUS

## 2023-04-12 ENCOUNTER — Telehealth: Payer: Self-pay

## 2023-04-12 NOTE — Telephone Encounter (Signed)
 noted

## 2023-04-12 NOTE — Telephone Encounter (Signed)
 Pt has phoned/ Mycharted several times regarding this report. Please advise

## 2023-04-12 NOTE — Telephone Encounter (Signed)
 See result note.

## 2023-04-18 ENCOUNTER — Ambulatory Visit: Payer: BC Managed Care – PPO | Admitting: Gastroenterology

## 2023-04-18 NOTE — Telephone Encounter (Signed)
noted

## 2023-04-25 ENCOUNTER — Ambulatory Visit: Payer: BC Managed Care – PPO | Admitting: Gastroenterology

## 2023-05-02 ENCOUNTER — Ambulatory Visit: Payer: BC Managed Care – PPO | Admitting: Gastroenterology

## 2023-05-02 VITALS — BP 153/86 | HR 87 | Temp 98.2°F | Ht 60.0 in | Wt 157.1 lb

## 2023-05-02 DIAGNOSIS — R748 Abnormal levels of other serum enzymes: Secondary | ICD-10-CM | POA: Insufficient documentation

## 2023-05-02 DIAGNOSIS — K2270 Barrett's esophagus without dysplasia: Secondary | ICD-10-CM | POA: Diagnosis not present

## 2023-05-02 DIAGNOSIS — K760 Fatty (change of) liver, not elsewhere classified: Secondary | ICD-10-CM | POA: Diagnosis not present

## 2023-05-02 DIAGNOSIS — R1011 Right upper quadrant pain: Secondary | ICD-10-CM

## 2023-05-02 NOTE — Patient Instructions (Signed)
 I am glad you are better!  We will recheck liver numbers in 6 months and see you in 6 months.  Please message if any recurrence of symptoms!  Continue great efforts with diet changes, weight loss, exercise, and management of other health issues (cholesterol).   I would recommend getting a baseline bone density scan. You can talk about this with your primary care when you see her!  I enjoyed seeing you again today! I value our relationship and want to provide genuine, compassionate, and quality care. You may receive a survey regarding your visit with me, and I welcome your feedback! Thanks so much for taking the time to complete this. I look forward to seeing you again.      Gelene Mink, PhD, ANP-BC [facility]

## 2023-05-02 NOTE — Progress Notes (Signed)
 Gastroenterology Office Note     Primary Care Physician:  Pollyann Savoy, NP  Primary Gastroenterologist: Dr. Marletta Lor    Chief Complaint   Chief Complaint  Patient presents with   hepatic steatosis    Follow up on hepatic steatosis. States her pain is better in abdomen. Has not had much pain in the past week. Pain is mostly when lying down. Would like to discuss MRI results.      History of Present Illness   Sheryl Lowe is a 63 y.o. female presenting today with a history of refractory Cdiff in Sept/Oct 2022 s/p vanc taper, Cdiff episode 03/17/22 during hospitalization for sepsis due to obstructive uropathy secondary to pyelonephritis, nephrolithiasis, Barrett's noted on EGD in 2022 (surveillance in 2025. She remained on a prolonged course of vancomycin once a day while being treated for UTI as a prophylactic measure). Known hepatic steatosis with isolated elevation of alk phos on several occasions recently noted. She also had noted RUQ discomfort since around July 2024.   RUQ discomfort: Elastography without significant stiffness but kPa was 0.4 so less reliable data. Fatty liver noted. CBD 8 mm. Persistent mild elevation of alk phos noted. MRCP with CBD 0.9. no CBD stones. AMA, celiac serologies negative.   Pain is better in RUQ. Still will have occasional RUQ discomfort then over to LUQ, sometimes a knot in upper abdomen. Overall improved. Most is when laying down at night. Calmed down from before. Doesn't recall any muscle strain or exertion. Takin 1/2 phentermine in morning. On a diet plan. Watching how she is eating. Drinking more water. This has helped her symptoms. We had discussed an endoscopy but as she feels better, she is wanting to hold off on this currently.   .   Low Vit D. Vit D weekly.   Jan 2025: Alk phos 159  Tchol 251, TG 174, HDL 34, LDL 184  Colonoscopy Dec 2022: non-bleeding internal hemorrhoids, otherwise normal EGD Dec 2022: short-segment  Barrett's, gastritis, normal duodenum, small hiatal hernia. Surveillance in Dec 2025.   Past Medical History:  Diagnosis Date   Clostridium difficile infection    Complication of anesthesia    slow to wake up - with gallbladder surgery   History of kidney stones    Hyperlipidemia    Hypertension    Kidney stones    Migraine     Past Surgical History:  Procedure Laterality Date   BIOPSY  02/04/2021   Procedure: BIOPSY;  Surgeon: Lanelle Bal, DO;  Location: [facility];  Service: Endoscopy;;   CHOLECYSTECTOMY  2020   COLONOSCOPY WITH PROPOFOL N/A 02/04/2021   nonbleeding internal hemorrhoids, exam otherwise normal.  Repeat in 10 years   CYSTOSCOPY WITH RETROGRADE PYELOGRAM, URETEROSCOPY AND STENT PLACEMENT Right 03/16/2022   Procedure: CYSTOSCOPY WITH RETROGRADE PYELOGRAM AND STENT PLACEMENT;  Surgeon: Belva Agee, MD;  Location: [facility];  Service: Urology;  Laterality: Right;   CYSTOSCOPY/URETEROSCOPY/HOLMIUM LASER/STENT PLACEMENT Right 04/04/2022   Procedure: CYSTOSCOPY/URETEROSCOPY/HOLMIUM LASER/STENT EXCHANGE;  Surgeon: Belva Agee, MD;  Location: [facility];  Service: Urology;  Laterality: Right;  stent removal   ESOPHAGOGASTRODUODENOSCOPY (EGD) WITH PROPOFOL N/A 02/04/2021   esophageal mucosal changes suspicious for short segment Barrett's esophagus (intestinal metaplasia), gastritis (mild nonspecific reactive gastropathy), normal duodenum, small hiatal hernia. Repeat EGD in 3 years.    Current Outpatient Medications  Medication Sig Dispense Refill   atorvastatin (LIPITOR) 10 MG tablet Take 1 tablet (10 mg total) by mouth daily. Need  f/u visit for additional refills 30 tablet 0   omeprazole (PRILOSEC) 20 MG capsule Take 20 mg by mouth daily.     valsartan (DIOVAN) 80 MG tablet Take 80 mg by mouth daily.     No current facility-administered medications for this visit.    Allergies as of 05/02/2023 - Review Complete 05/02/2023  Allergen Reaction Noted    Amitriptyline  10/18/2015   Topamax [topiramate]  10/18/2015    Family History  Problem Relation Age of Onset   Stroke Mother    Heart disease Mother    Hypertension Father    Cancer Father    Colon cancer Neg Hx    Colon polyps Neg Hx     Social History   Socioeconomic History   Marital status: Married    Spouse name: Not on file   Number of children: Not on file   Years of education: Not on file   Highest education level: Not on file  Occupational History   Not on file  Tobacco Use   Smoking status: Never    Passive exposure: Never   Smokeless tobacco: Never  Vaping Use   Vaping status: Never Used  Substance and Sexual Activity   Alcohol use: Not Currently   Drug use: No   Sexual activity: Not on file  Other Topics Concern   Not on file  Social History Narrative   Not on file   Social Drivers of Health   Financial Resource Strain: Not on file  Food Insecurity: No Food Insecurity (03/17/2022)   Hunger Vital Sign    Worried About Running Out of Food in the Last Year: Never true    Ran Out of Food in the Last Year: Never true  Transportation Needs: No Transportation Needs (03/17/2022)   PRAPARE - Administrator, Civil Service (Medical): No    Lack of Transportation (Non-Medical): No  Physical Activity: Not on file  Stress: Not on file  Social Connections: Not on file  Intimate Partner Violence: Not At Risk (03/17/2022)   Humiliation, Afraid, Rape, and Kick questionnaire    Fear of Current or Ex-Partner: No    Emotionally Abused: No    Physically Abused: No    Sexually Abused: No     Review of Systems   Gen: Denies any fever, chills, fatigue, weight loss, lack of appetite.  CV: Denies chest pain, heart palpitations, peripheral edema, syncope.  Resp: Denies shortness of breath at rest or with exertion. Denies wheezing or cough.  GI: Denies dysphagia or odynophagia. Denies jaundice, hematemesis, fecal incontinence. GU : Denies urinary burning,  urinary frequency, urinary hesitancy MS: Denies joint pain, muscle weakness, cramps, or limitation of movement.  Derm: Denies rash, itching, dry skin Psych: Denies depression, anxiety, memory loss, and confusion Heme: Denies bruising, bleeding, and enlarged lymph nodes.   Physical Exam   BP (!) 153/86   Pulse 87   Temp 98.2 F (36.8 C) (Oral)   Ht 5' (1.524 m)   Wt 157 lb 1.6 oz (71.3 kg)   BMI 30.68 kg/m  General:   Alert and oriented. Pleasant and cooperative. Well-nourished and well-developed.  Head:  Normocephalic and atraumatic. Eyes:  Without icterus Abdomen:  +BS, soft, non-tender and non-distended. No HSM noted. No guarding or rebound. No masses appreciated.  Rectal:  Deferred  Msk:  Symmetrical without gross deformities. Normal posture. Extremities:  Without edema. Neurologic:  Alert and  oriented x4;  grossly normal neurologically. Skin:  Intact without significant lesions  or rashes. Psych:  Alert and cooperative. Normal mood and affect.   Assessment   Sheryl Lowe is a 63 y.o. female presenting today with a history of refractory Cdiff in Sept/Oct 2022 s/p vanc taper, Cdiff episode 03/17/22 during hospitalization for sepsis due to obstructive uropathy secondary to pyelonephritis, nephrolithiasis, Barrett's in 2022, hepatic steatosis, and chronic RUQ abdominal discomfort.   Hepatic steatosis: with mildly isolated alk phos elevation, alk phos isoenzymes with predominantly bone origin, ELF with low risk of progression, AMA negative, celiac panel negative, MRCP negative. 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 Will continue ELF yearly and serial monitoring of LFTs. Continue with diet/behavior modification.  RUQ discomfort: thorough evaluation with labs, MRCP, and now noting improvement. Had recommended EGD to wrap up evaluation but wanting to wait as feels better.   Barrett's: Continue PPI indefinitely. Surveillance later in 2025.     PLAN    Omeprazole daily LFTs in 6  months Continue diet/behavior modification Recommend baseline DEXA through PCP Return in 6 months  Yearly ELF   Gelene Mink, PhD, [facility]

## 2023-06-12 ENCOUNTER — Ambulatory Visit: Admitting: Obstetrics & Gynecology

## 2023-09-21 ENCOUNTER — Other Ambulatory Visit: Payer: Self-pay

## 2023-09-21 ENCOUNTER — Emergency Department (HOSPITAL_COMMUNITY)
Admission: EM | Admit: 2023-09-21 | Discharge: 2023-09-21 | Disposition: A | Discharge status: Home / Self Care | Attending: Emergency Medicine | Admitting: Emergency Medicine

## 2023-09-21 ENCOUNTER — Encounter (HOSPITAL_COMMUNITY): Payer: Self-pay | Admitting: Emergency Medicine

## 2023-09-21 ENCOUNTER — Emergency Department (HOSPITAL_COMMUNITY)

## 2023-09-21 DIAGNOSIS — E119 Type 2 diabetes mellitus without complications: Secondary | ICD-10-CM | POA: Insufficient documentation

## 2023-09-21 DIAGNOSIS — Z79899 Other long term (current) drug therapy: Secondary | ICD-10-CM | POA: Diagnosis not present

## 2023-09-21 DIAGNOSIS — I10 Essential (primary) hypertension: Secondary | ICD-10-CM | POA: Insufficient documentation

## 2023-09-21 DIAGNOSIS — R030 Elevated blood-pressure reading, without diagnosis of hypertension: Secondary | ICD-10-CM

## 2023-09-21 DIAGNOSIS — R519 Headache, unspecified: Secondary | ICD-10-CM | POA: Diagnosis present

## 2023-09-21 LAB — BASIC METABOLIC PANEL WITH GFR
Anion gap: 11 (ref 5–15)
BUN: 16 mg/dL (ref 8–23)
CO2: 25 mmol/L (ref 22–32)
Calcium: 9.2 mg/dL (ref 8.9–10.3)
Chloride: 106 mmol/L (ref 98–111)
Creatinine, Ser: 0.81 mg/dL (ref 0.44–1.00)
GFR, Estimated: >60 mL/min (ref >60)
Glucose, Bld: 131 mg/dL — ABNORMAL HIGH (ref 70–99)
Potassium: 4.3 mmol/L (ref 3.5–5.1)
Sodium: 142 mmol/L (ref 135–145)

## 2023-09-21 LAB — CBC WITH DIFFERENTIAL/PLATELET
Abs Immature Granulocytes: 0.02 K/uL (ref 0.00–0.07)
Basophils Absolute: 0 K/uL (ref 0.0–0.1)
Basophils Relative: 1 %
Eosinophils Absolute: 0.2 K/uL (ref 0.0–0.5)
Eosinophils Relative: 3 %
HCT: 40.7 % (ref 36.0–46.0)
Hemoglobin: 13.1 g/dL (ref 12.0–15.0)
Immature Granulocytes: 0 %
Lymphocytes Relative: 28 %
Lymphs Abs: 1.9 K/uL (ref 0.7–4.0)
MCH: 29 pg (ref 26.0–34.0)
MCHC: 32.2 g/dL (ref 30.0–36.0)
MCV: 90.2 fL (ref 80.0–100.0)
Monocytes Absolute: 0.5 K/uL (ref 0.1–1.0)
Monocytes Relative: 8 %
Neutro Abs: 3.9 K/uL (ref 1.7–7.7)
Neutrophils Relative %: 60 %
Platelets: 171 K/uL (ref 150–400)
RBC: 4.51 MIL/uL (ref 3.87–5.11)
RDW: 13.3 % (ref 11.5–15.5)
WBC: 6.6 K/uL (ref 4.0–10.5)
nRBC: 0 % (ref 0.0–0.2)

## 2023-09-21 LAB — TROPONIN I (HIGH SENSITIVITY): Troponin I (High Sensitivity): 5 ng/L (ref <18)

## 2023-09-21 MED ORDER — CLONIDINE HCL 0.1 MG PO TABS
0.1000 mg | ORAL_TABLET | Freq: Once | ORAL | Status: AC
  Administered 2023-09-21: 0.1 mg via ORAL
  Filled 2023-09-21: qty 1

## 2023-09-21 NOTE — ED Notes (Signed)
 Lab at bedside

## 2023-09-21 NOTE — ED Provider Notes (Signed)
 [place] EMERGENCY DEPARTMENT AT [facility] Provider Note   CSN: [id number] Arrival date & time: 09/21/23  [id number]     Patient presents with: Hypertension   Sheryl Lowe is a 62 y.o. female.   Patient is a 63 year old female with history of hypertension, hyperlipidemia, diabetes, anxiety.  Patient presenting today with complaints of elevated blood pressure, headaches, and feeling generally unwell.  She has been coughing and congested for the past week.  She went to the primary doctor earlier this week and was tested for COVID/flu/RSV, and was told everything was negative.  While she was there, her blood pressure was elevated.  She has been monitoring her blood pressures at home and getting readings of 150s to 170s when her blood pressures are normally in the 120s.  Her primary doctor did not want to change her medications based on the reading in the office and told her that if her blood pressure remained high, she should come to the ER.  Patient states that her blood pressure remains high and would like to be evaluated.       Prior to Admission medications   Medication Sig Start Date End Date Taking? Authorizing Provider  atorvastatin  (LIPITOR) 10 MG tablet Take 1 tablet (10 mg total) by mouth daily. Need f/u visit for additional refills 11/06/20  Yes Burns, Glade PARAS, MD  omeprazole  (PRILOSEC) 20 MG capsule Take 20 mg by mouth daily.   Yes [provider]  valsartan  (DIOVAN ) 80 MG tablet Take 80 mg by mouth daily.   Yes [provider]    Allergies: Amitriptyline  and Topamax  [topiramate ]    Review of Systems  All other systems reviewed and are negative.   Updated Vital Signs BP (!) 183/93   Pulse 82   Temp 98 F (36.7 C) (Oral)   Resp 20   Ht 5' (1.524 m)   Wt 70.8 kg   SpO2 96%   BMI 30.47 kg/m   Physical Exam Vitals and nursing note reviewed.  Constitutional:      General: She is not in acute distress.    Appearance: She is well-developed. She  is not diaphoretic.  HENT:     Head: Normocephalic and atraumatic.  Eyes:     Extraocular Movements: Extraocular movements intact.     Pupils: Pupils are equal, round, and reactive to light.  Cardiovascular:     Rate and Rhythm: Normal rate and regular rhythm.     Heart sounds: No murmur heard.    No friction rub. No gallop.  Pulmonary:     Effort: Pulmonary effort is normal. No respiratory distress.     Breath sounds: Normal breath sounds. No wheezing.  Abdominal:     General: Bowel sounds are normal. There is no distension.     Palpations: Abdomen is soft.     Tenderness: There is no abdominal tenderness.  Musculoskeletal:        General: Normal range of motion.     Cervical back: Normal range of motion and neck supple.  Skin:    General: Skin is warm and dry.  Neurological:     General: No focal deficit present.     Mental Status: She is alert and oriented to person, place, and time.     Cranial Nerves: No cranial nerve deficit.     Motor: No weakness.     (all labs ordered are listed, but only abnormal results are displayed) Labs Reviewed - No data to display  EKG: None  Radiology: No results found.   Procedures   Medications Ordered in the ED  cloNIDine (CATAPRES) tablet 0.1 mg (has no administration in time range)                                    Medical Decision Making Amount and/or Complexity of Data Reviewed Labs: ordered. Radiology: ordered.  Risk Prescription drug management.   Patient presenting with elevated blood pressure as described in the HPI.  She arrives here with blood pressure of 190/100, but vitals are otherwise stable.  Physical examination is unremarkable and she is neurologically intact.  I have initiated a workup including CBC, metabolic panel, and troponin.  I have also ordered an EKG and CT scan of her head to further evaluate for endorgan damage or other complicating issues.  Also ordered is a dose of clonidine to bring her  blood pressure down in the short-term.  I will sign this patient out to oncoming provider at shift change to obtain the results of the studies and determine the final disposition.     Final diagnoses:  None    ED Discharge Orders     None          Geroldine Berg, MD 09/21/23 [phone]

## 2023-09-21 NOTE — ED Triage Notes (Signed)
 Pt here for HTN. States was seen by PCP on Wednes day and was told her BP was severely high and to keep track of it at home and that if BP continued to be elevated, to seek care at the ED. Pt also reports non-productive cough and said she was tested for all the things and all tests were negative.

## 2023-09-21 NOTE — Discharge Instructions (Signed)
 It was our pleasure to provide your ER care today - we hope that you feel better. Overall, your tests/lab tests look good.   Your blood pressure is high today - continue your meds, follow heart health eating plan, and follow up closely with your doctor in the coming week - have blood pressure rechecked then, and discuss possible change in blood pressure therapy if remains high.   Return to ER if worse, new symptoms, fevers, chest pain, trouble breathing, or other concern.

## 2023-09-21 NOTE — ED Notes (Signed)
 Patient transported to CT

## 2023-09-21 NOTE — ED Provider Notes (Addendum)
 Signed out by Dr Geroldine that patient with hx htn, and concern for bp being high, and to d/c to home when labs resulted.   Labs look good.   No chest pain or sob. No headache. Bp 154/91. Pt indicates has adequate of her bp meds at home. Pt appears stable for ED d/c.   Rec pcp f/u. Return precautions provided.     Results for orders placed or performed during the hospital encounter of 09/21/23  Basic metabolic panel   Collection Time: 09/21/23  6:54 AM  Result Value Ref Range   Sodium 142 135 - 145 mmol/L   Potassium 4.3 3.5 - 5.1 mmol/L   Chloride 106 98 - 111 mmol/L   CO2 25 22 - 32 mmol/L   Glucose, Bld 131 (H) 70 - 99 mg/dL   BUN 16 8 - 23 mg/dL   Creatinine, Ser 9.18 0.44 - 1.00 mg/dL   Calcium  9.2 8.9 - 10.3 mg/dL   GFR, Estimated >39 >39 mL/min   Anion gap 11 5 - 15  CBC with Differential   Collection Time: 09/21/23  6:54 AM  Result Value Ref Range   WBC 6.6 4.0 - 10.5 K/uL   RBC 4.51 3.87 - 5.11 MIL/uL   Hemoglobin 13.1 12.0 - 15.0 g/dL   HCT 59.2 63.9 - 53.9 %   MCV 90.2 80.0 - 100.0 fL   MCH 29.0 26.0 - 34.0 pg   MCHC 32.2 30.0 - 36.0 g/dL   RDW 86.6 88.4 - 84.4 %   Platelets 171 150 - 400 K/uL   nRBC 0.0 0.0 - 0.2 %   Neutrophils Relative % 60 %   Neutro Abs 3.9 1.7 - 7.7 K/uL   Lymphocytes Relative 28 %   Lymphs Abs 1.9 0.7 - 4.0 K/uL   Monocytes Relative 8 %   Monocytes Absolute 0.5 0.1 - 1.0 K/uL   Eosinophils Relative 3 %   Eosinophils Absolute 0.2 0.0 - 0.5 K/uL   Basophils Relative 1 %   Basophils Absolute 0.0 0.0 - 0.1 K/uL   Immature Granulocytes 0 %   Abs Immature Granulocytes 0.02 0.00 - 0.07 K/uL  Troponin I (High Sensitivity)   Collection Time: 09/21/23  6:54 AM  Result Value Ref Range   Troponin I (High Sensitivity) 5 <18 ng/L   CT Head Wo Contrast Result Date: 09/21/2023 CLINICAL DATA:  63 year old female with headache, hypertensive. EXAM: CT HEAD WITHOUT CONTRAST TECHNIQUE: Contiguous axial images were obtained from the base of the skull  through the vertex without intravenous contrast. RADIATION DOSE REDUCTION: This exam was performed according to the departmental dose-optimization program which includes automated exposure control, adjustment of the mA and/or kV according to patient size and/or use of iterative reconstruction technique. COMPARISON:  None Available. FINDINGS: Brain: Normal cerebral volume for age. No midline shift, ventriculomegaly, mass effect, evidence of mass lesion, intracranial hemorrhage or evidence of cortically based acute infarction. Gray-white matter differentiation is within normal limits throughout the brain. Vascular: No suspicious intracranial vascular hyperdensity. Minimal calcified atherosclerosis at the skull base. Skull: Intact.  No acute osseous abnormality identified. Sinuses/Orbits: Visualized paranasal sinuses and mastoids are well aerated. Other: Visualized orbits and scalp soft tissues are within normal limits. IMPRESSION: Normal for age noncontrast Head CT. Electronically Signed   By: VEAR Hurst M.D.   On: 09/21/2023 06:53       Bernard Drivers, MD 09/21/23 [phone]

## 2023-10-01 NOTE — Progress Notes (Signed)
 Erroneous encounter

## 2023-10-03 ENCOUNTER — Encounter: Payer: Self-pay | Admitting: Gastroenterology

## 2023-11-29 ENCOUNTER — Ambulatory Visit: Admitting: Gastroenterology

## 2024-01-02 NOTE — Telephone Encounter (Signed)
 Patient advised   Per Annabella Schroeder NP  Kidneys are a little dry Decrease HCTZ to M,W,F  Recheck labs in 3-4 weeks  Order faxed to PCP at patient request   Patient verbalized understanding   Suzen JONETTA Larsson, CMA 01/02/2024 9:09 AM

## 2024-01-09 ENCOUNTER — Encounter (INDEPENDENT_AMBULATORY_CARE_PROVIDER_SITE_OTHER): Payer: Self-pay | Admitting: *Deleted

## 2024-01-17 ENCOUNTER — Ambulatory Visit: Admitting: Gastroenterology

## 2024-01-17 ENCOUNTER — Encounter: Payer: Self-pay | Admitting: *Deleted

## 2024-01-17 ENCOUNTER — Telehealth: Payer: Self-pay | Admitting: *Deleted

## 2024-01-17 VITALS — BP 130/80 | HR 94 | Temp 98.7°F | Ht 60.0 in | Wt 161.0 lb

## 2024-01-17 DIAGNOSIS — K219 Gastro-esophageal reflux disease without esophagitis: Secondary | ICD-10-CM

## 2024-01-17 DIAGNOSIS — K760 Fatty (change of) liver, not elsewhere classified: Secondary | ICD-10-CM

## 2024-01-17 DIAGNOSIS — K2270 Barrett's esophagus without dysplasia: Secondary | ICD-10-CM | POA: Insufficient documentation

## 2024-01-17 NOTE — Progress Notes (Addendum)
 Gastroenterology Office Note     Primary Care Physician:  Arloa Almarie NOVAK, FNP  Primary Gastroenterologist: Dr. Cindie   Chief Complaint   Chief Complaint  Patient presents with   Follow-up    Follow up. Pt still states she is good     History of Present Illness   Sheryl Lowe is a 63 y.o. 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   Omeprazole  daily, OTC. Does well wit this. [employer] brand and only one that works. No dysphagia.   Occasional right-sided abdominal pain which has been extensively evaluated and felt r/t musculoskeletal etiology.   Hepatic steatosis: Elastography without significant stiffness but kPa was 0.4 so less reliable data. Fatty liver noted. CBD 8 mm. Persistent mild elevation of alk phos noted. MRCP with CBD 0.9. no CBD stones. AMA, celiac serologies negative. alk phos isoenzymes with predominantly bone origin, ELF 9.67, AMA negative, celiac panel negative, MRCP negative. 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 Checking ELF serially and HFP. She is working on lifestyle/behavior changes.  She has been on Zepbound in the past and would like to resume this in the future as she feels this was helpful with weight loss.  She believes she has probably taken Wegovy  in the past.  We discussed that it is now approved for her fibrosis.  She is interested in pursuing this if possible.   Colonoscopy Dec 2022: non-bleeding internal hemorrhoids, otherwise normal  EGD Dec 2022: short-segment Barrett's, gastritis, normal duodenum, small hiatal hernia. Surveillance in Dec 2025.   Past Medical History:  Diagnosis Date   Clostridium difficile infection    Complication of anesthesia    slow to wake up - with gallbladder  surgery   History of kidney stones    Hyperlipidemia    Hypertension    Kidney stones    Migraine     Past Surgical History:  Procedure Laterality Date   BIOPSY  02/04/2021   Procedure: BIOPSY;  Surgeon: Cindie Carlin POUR, DO;  [facility];  Service: Endoscopy;;   CHOLECYSTECTOMY  2020   COLONOSCOPY WITH PROPOFOL  N/A 02/04/2021   nonbleeding internal hemorrhoids, exam otherwise normal.  Repeat in 10 years   CYSTOSCOPY WITH RETROGRADE PYELOGRAM, URETEROSCOPY AND STENT PLACEMENT Right 03/16/2022   Procedure: CYSTOSCOPY WITH RETROGRADE PYELOGRAM AND STENT PLACEMENT;  Surgeon: Rosalind Zachary NOVAK, MD;  Location: [facility];  Service: Urology;  Laterality: Right;   CYSTOSCOPY/URETEROSCOPY/HOLMIUM LASER/STENT PLACEMENT Right 04/04/2022   Procedure: CYSTOSCOPY/URETEROSCOPY/HOLMIUM LASER/STENT EXCHANGE;  Surgeon: Rosalind Zachary NOVAK, MD;  Location: [facility];  Service: Urology;  Laterality: Right;  stent removal   ESOPHAGOGASTRODUODENOSCOPY (EGD) WITH PROPOFOL  N/A 02/04/2021   esophageal mucosal changes suspicious for short segment Barrett's esophagus (intestinal metaplasia), gastritis (mild nonspecific reactive gastropathy), normal duodenum, small hiatal hernia. Repeat EGD in 3 years.    Current Outpatient Medications  Medication Sig Dispense Refill   atorvastatin  (LIPITOR) 10 MG tablet Take 1 tablet (10 mg total) by mouth daily. Need f/u visit for additional refills 30 tablet 0   omeprazole  (PRILOSEC) 20 MG capsule Take 20 mg by mouth daily.     valsartan  (DIOVAN ) 80 MG tablet Take 80 mg by mouth daily.     No current facility-administered medications for this visit.    Allergies as of  01/17/2024 - Review Complete 01/17/2024  Allergen Reaction Noted   Amitriptyline   10/18/2015   Topamax  [topiramate ]  10/18/2015    Family History  Problem Relation Age of Onset   Stroke Mother    Heart disease Mother    Hypertension Father    Cancer Father    Colon cancer Neg Hx    Colon polyps  Neg Hx     Social History   Socioeconomic History   Marital status: Married    Spouse name: Not on file   Number of children: Not on file   Years of education: Not on file   Highest education level: Not on file  Occupational History   Not on file  Tobacco Use   Smoking status: Never    Passive exposure: Never   Smokeless tobacco: Never  Vaping Use   Vaping status: Never Used  Substance and Sexual Activity   Alcohol use: Not Currently   Drug use: No   Sexual activity: Not on file  Other Topics Concern   Not on file  Social History Narrative   Not on file   Social Drivers of Health   Financial Resource Strain: Not on file  Food Insecurity: No Food Insecurity (03/17/2022)   Hunger Vital Sign    Worried About Running Out of Food in the Last Year: Never true    Ran Out of Food in the Last Year: Never true  Transportation Needs: No Transportation Needs (03/17/2022)   PRAPARE - Administrator, Civil Service (Medical): No    Lack of Transportation (Non-Medical): No  Physical Activity: Not on file  Stress: Not on file  Social Connections: Not on file  Intimate Partner Violence: Not At Risk (03/17/2022)   Humiliation, Afraid, Rape, and Kick questionnaire    Fear of Current or Ex-Partner: No    Emotionally Abused: No    Physically Abused: No    Sexually Abused: No     Review of Systems   Gen: Denies any fever, chills, fatigue, weight loss, lack of appetite.  CV: Denies chest pain, heart palpitations, peripheral edema, syncope.  Resp: Denies shortness of breath at rest or with exertion. Denies wheezing or cough.  GI: Denies dysphagia or odynophagia. Denies jaundice, hematemesis, fecal incontinence. GU : Denies urinary burning, urinary frequency, urinary hesitancy MS: Denies joint pain, muscle weakness, cramps, or limitation of movement.  Derm: Denies rash, itching, dry skin Psych: Denies depression, anxiety, memory loss, and confusion Heme: Denies bruising,  bleeding, and enlarged lymph nodes.   Physical Exam   BP 130/80   Pulse 94   Temp 98.7 F (37.1 C)   Ht 5' (1.524 m)   Wt 161 lb (73 kg)   BMI 31.44 kg/m  General:   Alert and oriented. Pleasant and cooperative. Well-nourished and well-developed.  Head:  Normocephalic and atraumatic. Eyes:  Without icterus Abdomen:  +BS, soft, non-tender and non-distended. No HSM noted. No guarding or rebound. No masses appreciated.  Rectal:  Deferred  Msk:  Symmetrical without gross deformities. Normal posture. Extremities:  Without edema. Neurologic:  Alert and  oriented x4;  grossly normal neurologically. Skin:  Intact without significant lesions or rashes. Psych:  Alert and cooperative. Normal mood and affect.   Assessment   Sheryl Lowe is a 63 y.o. female presenting today with a history of refractory Cdiff in past but doing well currently, GERD and Barrett's on EGD in 2022, hepatic steatosis, returning today to arrange surveillance for Barrett's.   GERD is  well-managed with omeprazole  daily over-the-counter, and this seems to do better than any other brands.  She has no dysphagia or alarm signs, symptoms.  We will arrange EGD for 3-year surveillance due to Barrett's.   MASLD: Historically elf was 9.67, transaminases have been normal, alk phos mildly elevated intermittently but with isoenzymes predominantly bone origin, AMA negative, celiac negative, MRCP negative.  Will update labs today and consider submitting for Wegovy  if she has fibrosis.  Otherwise, she can follow-up with her primary care to get on another GLP-1 as she had done well with this in the past.       PLAN   Proceed with upper endoscopy by Dr. Cindie in near future for Barrett's surveillance: the risks, benefits, and alternatives have been discussed with the patient in detail. The patient states understanding and desires to proceed.  CBC, CMP, ELF, fibrosure Consider submitting for Wegovy  if she has evidence for fibrosis.   We can also discuss Rezdiffra ,but she has done well with GLP-1's in the past and would like to continue working on purposeful weight loss.  If no fibrosis, then we will need to have her follow-up with her primary care for management with a GLP-1. 2-month follow-up regardless.   Therisa MICAEL Stager, PhD, ANP-BC [facility]   ADDENDUM: ELF is 10.03, consistent with fibrosis. Submitting for Wegovy .

## 2024-01-17 NOTE — Telephone Encounter (Signed)
 [place] PA for EGD: Order ID: [id number]       Authorized  Approval Valid Through: 01/17/2024 - 03/16/2024

## 2024-01-17 NOTE — Patient Instructions (Signed)
 Continue omeprazole  daily on an empty stomach, at least 30 minutes before eating.  We are arranging an upper endoscopy with Dr Cindie in near future due to history of Barrett's esophagus.  Please have blood work done, and we can possibly submit for [facility] depending on fibrosis scores!  Further recommendations to follow!  We can see you in 6 months regardless!  I enjoyed seeing you again today! I value our relationship and want to provide genuine, compassionate, and quality care. You may receive a survey regarding your visit with me, and I welcome your feedback! Thanks so much for taking the time to complete this. I look forward to seeing you again.      Therisa MICAEL Stager, PhD, ANP-BC [facility]

## 2024-01-22 LAB — NASH FIBROSURE(R) PLUS

## 2024-01-23 LAB — ENHANCED LIVER FIBROSIS (ELF): ELF(TM) Score: 10.03 — AB (ref <9.80)

## 2024-01-23 LAB — COMPREHENSIVE METABOLIC PANEL WITH GFR
ALT: 21 IU/L (ref 0–32)
AST: 20 IU/L (ref 0–40)
Albumin: 4.3 g/dL (ref 3.9–4.9)
Alkaline Phosphatase: 127 IU/L (ref 49–135)
BUN/Creatinine Ratio: 18 (ref 12–28)
BUN: 15 mg/dL (ref 8–27)
Bilirubin Total: 0.4 mg/dL (ref 0.0–1.2)
CO2: 25 mmol/L (ref 20–29)
Calcium: 9.7 mg/dL (ref 8.7–10.3)
Chloride: 103 mmol/L (ref 96–106)
Creatinine, Ser: 0.83 mg/dL (ref 0.57–1.00)
Globulin, Total: 3.1 g/dL (ref 1.5–4.5)
Glucose: 127 mg/dL — ABNORMAL HIGH (ref 70–99)
Potassium: 3.5 mmol/L (ref 3.5–5.2)
Sodium: 143 mmol/L (ref 134–144)
Total Protein: 7.4 g/dL (ref 6.0–8.5)
eGFR: 80 mL/min/1.73 (ref >59)

## 2024-01-23 LAB — NASH FIBROSURE(R) PLUS
ALPHA 2-MACROGLOBULINS, QN: 299 mg/dL — AB (ref 110–276)
ALT (SGPT) P5P: 26 IU/L (ref 0–40)
AST (SGOT) P5P: 24 IU/L (ref 0–40)
Apolipoprotein A-1: 136 mg/dL (ref 116–209)
Bilirubin, Total: 0.3 mg/dL (ref 0.0–1.2)
Cholesterol, Total: 225 mg/dL — AB (ref 100–199)
Fibrosis Score: 0.3 — AB (ref 0.00–0.21)
GGT: 37 IU/L (ref 0–60)
Glucose: 130 mg/dL — AB (ref 70–99)
Haptoglobin: 210 mg/dL (ref 37–355)
NASH Score: 0.48 — AB (ref 0.00–0.25)
Steatosis Score: 0.68 — AB (ref 0.00–0.40)
Triglycerides: 173 mg/dL — AB (ref 0–149)

## 2024-01-29 ENCOUNTER — Ambulatory Visit: Payer: Self-pay | Admitting: Gastroenterology

## 2024-02-11 MED ORDER — WEGOVY 0.25 MG/0.5ML ~~LOC~~ SOAJ: 0.2500 mg | SUBCUTANEOUS | 0 refills | Status: AC

## 2024-02-11 NOTE — Telephone Encounter (Signed)
 Wegovy  0.25 mg [place] sent to pharmacy. Take weekly for 4 weeks, then will increase to 0.5 mg [place] weekly for 4 weeks, then 1 mg [place] weekly for 4 weeks, then 1.7 mg [place] weekly for 4 weeks and continue ongoing at this dose or consider increasing to 2.4 mg [place] weekly if tolerated.

## 2024-02-14 NOTE — Telephone Encounter (Signed)
 Do we have the PA info? I can help when filling this out.

## 2024-02-14 NOTE — Telephone Encounter (Signed)
 Just got off of [employer]. On your desk with pink sticky

## 2024-02-14 NOTE — Telephone Encounter (Signed)
 I have completed. Please send my last note and print out the ELF score and CMP.

## 2024-02-18 NOTE — Telephone Encounter (Signed)
 yes

## 2024-02-18 NOTE — Telephone Encounter (Signed)
 FYI:  Faxed the pt's documents (PA , ov note , labs ) to her insurance waiting on a response from the company

## 2024-02-19 ENCOUNTER — Telehealth: Payer: Self-pay

## 2024-02-19 NOTE — Telephone Encounter (Signed)
 Denial and appeal form in folder on your desk. Please advise

## 2024-02-20 NOTE — Telephone Encounter (Signed)
 Sheryl Lowe: I have drafted an appeal letter. Please send to Sheryl Lowe insurance with expedited appeal request and include the ELF score from November 2025. Wegovy  wasn't approved as insurance is requesting MR elastography or fibroscan.   We do not have immediate access to fibroscan (this is in [place] and not offered through our hospital system). We do not have access to MRI elastography.    I have used this rationale as below:   AASLD recommends fibroscan, MR elastography, or ELF score to assess for semaglutide  therapy. ELF values 9.2-10.5 correlate with F2/F3 fibrosis appropriate for this therapy Verda et al., 2025).     Sheryl EMERSON WENDI Lurena HILARIO Joesph IVAR FABIENE Kirt FABIENE CHRISTELLA., Sheryl Lowe, Sheryl LABOR., & Lowe, Sheryl CHRISTELLA. (2025). Semaglutide  therapy for metabolic dysfunction-associated steatohepatitis: November 2025 updates to AASLD Practice Guidance. [facility]at[facility] [place] 10.1097/HEP.[id number]. Advance online publication. Scholarresearch.com.br

## 2024-02-26 NOTE — Telephone Encounter (Signed)
 Letter faxed with notes / documentation to the pt's insurance appeals in [place] . Waiting on a response

## 2024-03-04 ENCOUNTER — Encounter (HOSPITAL_COMMUNITY)
Admission: RE | Disposition: A | Discharge status: Home / Self Care | Payer: Self-pay | Source: Home / Self Care | Attending: Internal Medicine

## 2024-03-04 ENCOUNTER — Other Ambulatory Visit: Payer: Self-pay

## 2024-03-04 ENCOUNTER — Ambulatory Visit (HOSPITAL_COMMUNITY)

## 2024-03-04 ENCOUNTER — Ambulatory Visit (HOSPITAL_COMMUNITY)
Admission: RE | Admit: 2024-03-04 | Discharge: 2024-03-04 | Disposition: A | Discharge status: Home / Self Care | Attending: Internal Medicine | Admitting: Internal Medicine

## 2024-03-04 ENCOUNTER — Encounter (HOSPITAL_COMMUNITY): Payer: Self-pay | Admitting: Internal Medicine

## 2024-03-04 DIAGNOSIS — K219 Gastro-esophageal reflux disease without esophagitis: Secondary | ICD-10-CM | POA: Diagnosis not present

## 2024-03-04 DIAGNOSIS — K2970 Gastritis, unspecified, without bleeding: Secondary | ICD-10-CM | POA: Insufficient documentation

## 2024-03-04 DIAGNOSIS — N289 Disorder of kidney and ureter, unspecified: Secondary | ICD-10-CM | POA: Insufficient documentation

## 2024-03-04 DIAGNOSIS — Z8249 Family history of ischemic heart disease and other diseases of the circulatory system: Secondary | ICD-10-CM | POA: Diagnosis not present

## 2024-03-04 DIAGNOSIS — Z7985 Long-term (current) use of injectable non-insulin antidiabetic drugs: Secondary | ICD-10-CM | POA: Insufficient documentation

## 2024-03-04 DIAGNOSIS — I10 Essential (primary) hypertension: Secondary | ICD-10-CM | POA: Insufficient documentation

## 2024-03-04 DIAGNOSIS — K2270 Barrett's esophagus without dysplasia: Secondary | ICD-10-CM | POA: Insufficient documentation

## 2024-03-04 DIAGNOSIS — E119 Type 2 diabetes mellitus without complications: Secondary | ICD-10-CM | POA: Diagnosis not present

## 2024-03-04 DIAGNOSIS — K449 Diaphragmatic hernia without obstruction or gangrene: Secondary | ICD-10-CM | POA: Insufficient documentation

## 2024-03-04 DIAGNOSIS — K3189 Other diseases of stomach and duodenum: Secondary | ICD-10-CM | POA: Insufficient documentation

## 2024-03-04 DIAGNOSIS — K319 Disease of stomach and duodenum, unspecified: Secondary | ICD-10-CM | POA: Diagnosis not present

## 2024-03-04 HISTORY — PX: ESOPHAGOGASTRODUODENOSCOPY: SHX5428

## 2024-03-04 LAB — GLUCOSE, CAPILLARY
Glucose-Capillary: 96 mg/dL (ref 70–99)
Glucose-Capillary: 87 mg/dL (ref 70–99)

## 2024-03-04 SURGERY — EGD (ESOPHAGOGASTRODUODENOSCOPY)
Anesthesia: General

## 2024-03-04 MED ORDER — LACTATED RINGERS IV SOLN
INTRAVENOUS | Status: DC
  Administered 2024-03-04: 500 mL via INTRAVENOUS

## 2024-03-04 MED ORDER — LIDOCAINE HCL (CARDIAC) PF 100 MG/5ML IV SOSY
PREFILLED_SYRINGE | INTRAVENOUS | Status: DC | PRN
  Administered 2024-03-04: 100 mg via INTRAVENOUS

## 2024-03-04 MED ORDER — DEXAMETHASONE SODIUM PHOSPHATE 4 MG/ML IJ SOLN
INTRAMUSCULAR | Status: DC | PRN
  Administered 2024-03-04: 8 mg via INTRAVENOUS

## 2024-03-04 MED ORDER — PROPOFOL 10 MG/ML IV BOLUS
INTRAVENOUS | Status: DC | PRN
  Administered 2024-03-04: 100 mg via INTRAVENOUS
  Administered 2024-03-04: 175 ug/kg/min via INTRAVENOUS

## 2024-03-04 NOTE — H&P (Signed)
 Primary Care Physician:  Arloa Almarie NOVAK, FNP Primary Gastroenterologist:  Dr. Cindie  Pre-Procedure History & Physical: HPI:  Sheryl Lowe is a 63 y.o. female is here for an EGD to be performed for surveillance of Barrett's esophagus  Past Medical History:  Diagnosis Date   Clostridium difficile infection    Complication of anesthesia    slow to wake up - with gallbladder surgery   History of kidney stones    Hyperlipidemia    Hypertension    Kidney stones    Migraine     Past Surgical History:  Procedure Laterality Date   BIOPSY  02/04/2021   Procedure: BIOPSY;  Surgeon: Cindie Carlin POUR, DO;  [facility];  Service: Endoscopy;;   CHOLECYSTECTOMY  2020   COLONOSCOPY WITH PROPOFOL  N/A 02/04/2021   nonbleeding internal hemorrhoids, exam otherwise normal.  Repeat in 10 years   CYSTOSCOPY WITH RETROGRADE PYELOGRAM, URETEROSCOPY AND STENT PLACEMENT Right 03/16/2022   Procedure: CYSTOSCOPY WITH RETROGRADE PYELOGRAM AND STENT PLACEMENT;  Surgeon: Rosalind Zachary NOVAK, MD;  Location: [facility];  Service: Urology;  Laterality: Right;   CYSTOSCOPY/URETEROSCOPY/HOLMIUM LASER/STENT PLACEMENT Right 04/04/2022   Procedure: CYSTOSCOPY/URETEROSCOPY/HOLMIUM LASER/STENT EXCHANGE;  Surgeon: Rosalind Zachary NOVAK, MD;  Location: [facility];  Service: Urology;  Laterality: Right;  stent removal   ESOPHAGOGASTRODUODENOSCOPY (EGD) WITH PROPOFOL  N/A 02/04/2021   esophageal mucosal changes suspicious for short segment Barrett's esophagus (intestinal metaplasia), gastritis (mild nonspecific reactive gastropathy), normal duodenum, small hiatal hernia. Repeat EGD in 3 years.    Prior to Admission medications  Medication Sig Start Date End Date Taking? Authorizing Provider  atorvastatin  (LIPITOR) 10 MG tablet Take 1 tablet (10 mg total) by mouth daily. Need f/u visit for additional refills 11/06/20  Yes Burns, Glade PARAS, MD  omeprazole  (PRILOSEC) 20 MG capsule Take 20 mg by mouth daily.   Yes [provider]  tirzepatide CLOYDE) 2.5 MG/0.5ML Pen Inject 2.5 mg into the skin once a week.   Yes [provider]  valsartan  (DIOVAN ) 80 MG tablet Take 80 mg by mouth daily.   Yes [provider]  semaglutide -weight management (WEGOVY ) 0.25 MG/0.5ML SOAJ SQ injection Inject 0.25 mg into the skin once a week. 02/11/24 03/12/24  Shirlean Therisa ORN, NP    Allergies as of 01/17/2024 - Review Complete 01/17/2024  Allergen Reaction Noted   Amitriptyline   10/18/2015   Topamax  [topiramate ]  10/18/2015    Family History  Problem Relation Age of Onset   Stroke Mother    Heart disease Mother    Hypertension Father    Cancer Father    Colon cancer Neg Hx    Colon polyps Neg Hx     Social History   Socioeconomic History   Marital status: Married    Spouse name: Not on file   Number of children: Not on file   Years of education: Not on file   Highest education level: Not on file  Occupational History   Not on file  Tobacco Use   Smoking status: Never    Passive exposure: Never   Smokeless tobacco: Never  Vaping Use   Vaping status: Never Used  Substance and Sexual Activity   Alcohol use: Not Currently   Drug use: No   Sexual activity: Not on file  Other Topics Concern   Not on file  Social History Narrative   Not on file   Social Drivers of Health   Tobacco Use: Low Risk  (12/31/2023)   Received from [facility]  Patient History    Smoking Tobacco Use: Never    Smokeless Tobacco Use: Never    Passive Exposure: Not on file  Financial Resource Strain: Not on file  Food Insecurity: No Food Insecurity (03/17/2022)   Hunger Vital Sign    Worried About Running Out of Food in the Last Year: Never true    Ran Out of Food in the Last Year: Never true  Transportation Needs: No Transportation Needs (03/17/2022)   PRAPARE - Administrator, Civil Service (Medical): No    Lack of Transportation (Non-Medical): No  Physical Activity: Not on file   Stress: Not on file  Social Connections: Not on file  Intimate Partner Violence: Not At Risk (03/17/2022)   Humiliation, Afraid, Rape, and Kick questionnaire    Fear of Current or Ex-Partner: No    Emotionally Abused: No    Physically Abused: No    Sexually Abused: No  Depression (PHQ2-9): Not on file  Alcohol Screen: Not on file  Housing: Low Risk (03/17/2022)   Housing    Last Housing Risk Score: 0  Utilities: Not At Risk (03/17/2022)   A[employer] Utilities    Threatened with loss of utilities: No  Health Literacy: Not on file    Review of Systems: General: Negative for fever, chills, fatigue, weakness. Eyes: Negative for vision changes.  ENT: Negative for hoarseness, difficulty swallowing , nasal congestion. CV: Negative for chest pain, angina, palpitations, dyspnea on exertion, peripheral edema.  Respiratory: Negative for dyspnea at rest, dyspnea on exertion, cough, sputum, wheezing.  GI: See history of present illness. GU:  Negative for dysuria, hematuria, urinary incontinence, urinary frequency, nocturnal urination.  MS: Negative for joint pain, low back pain.  Derm: Negative for rash or itching.  Neuro: Negative for weakness, abnormal sensation, seizure, frequent headaches, memory loss, confusion.  Psych: Negative for anxiety, depression Endo: Negative for unusual weight change.  Heme: Negative for bruising or bleeding. Allergy: Negative for rash or hives.  Physical Exam: Vital signs in last 24 hours: Temp:  [97.8 F (36.6 C)] 97.8 F (36.6 C) (12/30 0804) Pulse Rate:  [70] 70 (12/30 0804) Resp:  [19] 19 (12/30 0804) BP: (146)/(76) 146/76 (12/30 0804) SpO2:  [96 %] 96 % (12/30 0804) Weight:  [72.6 kg] 72.6 kg (12/30 0804)   General:   Alert,  Well-developed, well-nourished, pleasant and cooperative in NAD Head:  Normocephalic and atraumatic. Eyes:  Sclera clear, no icterus.   Conjunctiva pink. Ears:  Normal auditory acuity. Nose:  No deformity, discharge,  or  lesions. Msk:  Symmetrical without gross deformities. Normal posture. Extremities:  Without clubbing or edema. Neurologic:  Alert and  oriented x4;  grossly normal neurologically. Skin:  Intact without significant lesions or rashes. Psych:  Alert and cooperative. Normal mood and affect.   Impression/Plan: Sheryl Lowe is here for an EGD to be performed for surveillance of Barrett's esophagus   Risks, benefits, limitations, imponderables and alternatives regarding procedure have been reviewed with the patient. Questions have been answered. All parties agreeable.

## 2024-03-04 NOTE — Transfer of Care (Signed)
 Immediate Anesthesia Transfer of Care Note  Patient: Sheryl Lowe  Procedure(s) Performed: EGD (ESOPHAGOGASTRODUODENOSCOPY)  Patient Location: Endoscopy Unit  Anesthesia Type:General  Level of Consciousness: drowsy, patient cooperative, and responds to stimulation  Airway & Oxygen  Therapy: Patient Spontanous Breathing  Post-op Assessment: Report given to RN and Post -op Vital signs reviewed and stable  Post vital signs: Reviewed and stable  Last Vitals:  Vitals Value Taken Time  BP 111/74 03/04/24 09:12  Temp 36.5 C 03/04/24 09:12  Pulse 90 03/04/24 09:12  Resp 17 03/04/24 09:12  SpO2 98 % 03/04/24 09:12    Last Pain:  Vitals:   03/04/24 0912  TempSrc: Oral  PainSc: 0-No pain      Patients Stated Pain Goal: 5 (03/04/24 0804)  Complications: No notable events documented.

## 2024-03-04 NOTE — Op Note (Signed)
 [facility] Patient Name: Sheryl Lowe Procedure Date: 03/04/2024 8:51 AM MRN: [id number] Date of Birth: [birth year] Attending MD: Carlin POUR. Cindie , [place], [id number] CSN: [id number] Age: 63 Admit Type: Outpatient Procedure:                Upper GI endoscopy Indications:              Surveillance procedure, Follow-up of Barrett's                            esophagus Providers:                Carlin POUR. Cindie, DO, Devere Lodge, Bascom Blush Referring MD:              Medicines:                See the Anesthesia note for documentation of the                            administered medications Complications:            No immediate complications. Estimated Blood Loss:     Estimated blood loss was minimal. Procedure:                Pre-Anesthesia Assessment:                           - The anesthesia plan was to use monitored                            anesthesia care (MAC).                           After obtaining informed consent, the endoscope was                            passed under direct vision. Throughout the                            procedure, the patient's blood pressure, pulse, and                            oxygen  saturations were monitored continuously. The                            [id number] ([id number]) Upper was introduced through                            the mouth, and advanced to the second part of                            duodenum. The upper GI endoscopy was accomplished                            without difficulty. The patient tolerated the                            procedure well. Scope In:  9:01:24 AM Scope Out: 9:08:53 AM Total Procedure Duration: 0 hours 7 minutes 29 seconds  Findings:      A small hiatal hernia was present.      The esophagus and gastroesophageal junction were examined with white       light and narrow band imaging (NBI) from a forward view and retroflexed       position. There were esophageal mucosal changes secondary to  established       long-segment Barrett's disease. These changes involved the mucosa at the       upper extent of the gastric folds (33 cm from the incisors) extending to       the Z-line (30 cm from the incisors). Circumferential salmon-colored       mucosa was present from 31 to 33 cm and one tongue of salmon-colored       mucosa was present from 30 to 31 cm. The maximum longitudinal extent of       these esophageal mucosal changes was 3 cm in length. Mucosa was biopsied       with a cold forceps for histology in 4 quadrants at intervals of 2 cm at       the gastroesophageal junction. A total of 2 specimen bottles were sent       to pathology.      Localized moderate inflammation characterized by erosions and erythema       was found in the gastric antrum. Biopsies were taken with a cold forceps       for Helicobacter pylori testing.      The duodenal bulb, first portion of the duodenum and second portion of       the duodenum were normal. Impression:               - Small hiatal hernia.                           - Esophageal mucosal changes secondary to                            established long-segment Barrett's disease.                            Biopsied.                           - Gastritis. Biopsied.                           - Normal duodenal bulb, first portion of the                            duodenum and second portion of the duodenum. Moderate Sedation:      Per Anesthesia Care Recommendation:           - Patient has a contact number available for                            emergencies. The signs and symptoms of potential                            delayed complications were discussed with the  patient. Return to normal activities tomorrow.                            Written discharge instructions were provided to the                            patient.                           - Resume previous diet.                           - Continue  present medications.                           - Await pathology results.                           - Repeat upper endoscopy in 3 years for                            surveillance.                           - Return to GI clinic in 6 months.                           - Use a proton pump inhibitor PO daily. Procedure Code(s):        --- Professional ---                           [phone], Esophagogastroduodenoscopy, flexible,                            transoral; with biopsy, single or multiple Diagnosis Code(s):        --- Professional ---                           K44.9, Diaphragmatic hernia without obstruction or                            gangrene                           K22.70, Barrett's esophagus without dysplasia                           K29.70, Gastritis, unspecified, without bleeding CPT copyright 2022 American Medical Association. All rights reserved. The codes documented in this report are preliminary and upon coder review may  be revised to meet current compliance requirements. Carlin POUR. Cindie, DO Carlin POUR. Anfernee Peschke, DO 03/04/2024 9:14:20 AM This report has been signed electronically. Number of Addenda: 0

## 2024-03-04 NOTE — Anesthesia Postprocedure Evaluation (Signed)
"   Anesthesia Post Note  Patient: Sheryl Lowe  Procedure(s) Performed: EGD (ESOPHAGOGASTRODUODENOSCOPY)  Patient location during evaluation: PACU Anesthesia Type: General Level of consciousness: awake and alert Pain management: pain level controlled Vital Signs Assessment: post-procedure vital signs reviewed and stable Respiratory status: spontaneous breathing, nonlabored ventilation, respiratory function stable and patient connected to nasal cannula oxygen  Cardiovascular status: blood pressure returned to baseline and stable Postop Assessment: no apparent nausea or vomiting Anesthetic complications: no   No notable events documented.   Last Vitals:  Vitals:   03/04/24 0804 03/04/24 0912  BP: (!) 146/76 111/74  Pulse: 70 90  Resp: 19 17  Temp: 36.6 C 36.5 C  SpO2: 96% 98%    Last Pain:  Vitals:   03/04/24 0912  TempSrc: Oral  PainSc: 0-No pain                 Andrea Limes      "

## 2024-03-04 NOTE — Discharge Instructions (Signed)
 EGD Discharge instructions Please read the instructions outlined below and refer to this sheet in the next few weeks. These discharge instructions provide you with general information on caring for yourself after you leave the hospital. Your doctor may also give you specific instructions. While your treatment has been planned according to the most current medical practices available, unavoidable complications occasionally occur. If you have any problems or questions after discharge, please call your doctor. ACTIVITY You may resume your regular activity but move at a slower pace for the next 24 hours.  Take frequent rest periods for the next 24 hours.  Walking will help expel (get rid of) the air and reduce the bloated feeling in your abdomen.  No driving for 24 hours (because of the anesthesia (medicine) used during the test).  You may shower.  Do not sign any important legal documents or operate any machinery for 24 hours (because of the anesthesia used during the test).  NUTRITION Drink plenty of fluids.  You may resume your normal diet.  Begin with a light meal and progress to your normal diet.  Avoid alcoholic beverages for 24 hours or as instructed by your caregiver.  MEDICATIONS You may resume your normal medications unless your caregiver tells you otherwise.  WHAT YOU CAN EXPECT TODAY You may experience abdominal discomfort such as a feeling of fullness or gas pains.  FOLLOW-UP Your doctor will discuss the results of your test with you.  SEEK IMMEDIATE MEDICAL ATTENTION IF ANY OF THE FOLLOWING OCCUR: Excessive nausea (feeling sick to your stomach) and/or vomiting.  Severe abdominal pain and distention (swelling).  Trouble swallowing.  Temperature over 101 F (37.8 C).  Rectal bleeding or vomiting of blood.   Your EGD revealed mild amount inflammation in your stomach.  I took biopsies of this to rule out infection with a bacteria called H. pylori. Barrett's esophagus appeared  stable.  I took biopsies today.  Small bowel was normal.  Await pathology results, my office will contact you.  Continue on omeprazole  daily.  Follow-up in GI office in 6 months.  Repeat upper endoscopy in 3 years.  I hope you have a great rest of your week!  Carlin POUR. Cindie, D.O. [facility]

## 2024-03-04 NOTE — Anesthesia Preprocedure Evaluation (Signed)
"                                    Anesthesia Evaluation  Patient identified by MRN, date of birth, ID band Patient awake    Reviewed: Allergy & Precautions, H&P , NPO status , Patient's Chart, lab work & pertinent test results  History of Anesthesia Complications (+) DIFFICULT AIRWAY and history of anesthetic complications  Airway Mallampati: IV  TM Distance: <3 FB Neck ROM: Full    Dental no notable dental hx.    Pulmonary neg pulmonary ROS   Pulmonary exam normal breath sounds clear to auscultation       Cardiovascular hypertension, Normal cardiovascular exam Rhythm:Regular Rate:Normal     Neuro/Psych  Headaches PSYCHIATRIC DISORDERS Anxiety        GI/Hepatic Neg liver ROS,GERD  ,,  Endo/Other  diabetes    Renal/GU Renal disease  negative genitourinary   Musculoskeletal negative musculoskeletal ROS (+)    Abdominal   Peds negative pediatric ROS (+)  Hematology  (+) Blood dyscrasia, anemia   Anesthesia Other Findings   Reproductive/Obstetrics negative OB ROS                              Anesthesia Physical Anesthesia Plan  ASA: 2  Anesthesia Plan: General   Post-op Pain Management:    Induction: Intravenous  PONV Risk Score and Plan:   Airway Management Planned: Nasal Cannula  Additional Equipment:   Intra-op Plan:   Post-operative Plan:   Informed Consent: I have reviewed the patients History and Physical, chart, labs and discussed the procedure including the risks, benefits and alternatives for the proposed anesthesia with the patient or authorized representative who has indicated his/her understanding and acceptance.     Dental advisory given  Plan Discussed with: CRNA  Anesthesia Plan Comments:         Anesthesia Quick Evaluation  "

## 2024-03-06 LAB — SURGICAL PATHOLOGY

## 2024-03-10 ENCOUNTER — Encounter (HOSPITAL_COMMUNITY): Payer: Self-pay | Admitting: Internal Medicine

## 2024-03-13 NOTE — Telephone Encounter (Signed)
 FYI:  Phoned the pt regarding her receiving Wegovy  but she had not. Her PCP got her approved for Mounjaro. The pt thanks us  for trying

## 2024-05-15 ENCOUNTER — Encounter: Admitting: Obstetrics & Gynecology
